# Patient Record
Sex: Male | Born: 1945 | Race: White | Hispanic: No | Smoking: Former smoker
Health system: Southern US, Community
[De-identification: ages and names within clinical notes are randomized; demographics above are authoritative.]

## PROBLEM LIST (undated history)

## (undated) DIAGNOSIS — E119 Type 2 diabetes mellitus without complications: Secondary | ICD-10-CM

## (undated) DIAGNOSIS — C679 Malignant neoplasm of bladder, unspecified: Secondary | ICD-10-CM

## (undated) DIAGNOSIS — I10 Essential (primary) hypertension: Secondary | ICD-10-CM

## (undated) DIAGNOSIS — I4510 Unspecified right bundle-branch block: Secondary | ICD-10-CM

## (undated) DIAGNOSIS — I440 Atrioventricular block, first degree: Secondary | ICD-10-CM

## (undated) DIAGNOSIS — K219 Gastro-esophageal reflux disease without esophagitis: Secondary | ICD-10-CM

## (undated) DIAGNOSIS — I444 Left anterior fascicular block: Secondary | ICD-10-CM

## (undated) DIAGNOSIS — I2510 Atherosclerotic heart disease of native coronary artery without angina pectoris: Secondary | ICD-10-CM

## (undated) DIAGNOSIS — D494 Neoplasm of unspecified behavior of bladder: Secondary | ICD-10-CM

## (undated) DIAGNOSIS — I452 Bifascicular block: Secondary | ICD-10-CM

## (undated) DIAGNOSIS — I219 Acute myocardial infarction, unspecified: Secondary | ICD-10-CM

## (undated) DIAGNOSIS — S22009A Unspecified fracture of unspecified thoracic vertebra, initial encounter for closed fracture: Secondary | ICD-10-CM

## (undated) DIAGNOSIS — R55 Syncope and collapse: Secondary | ICD-10-CM

## (undated) HISTORY — PX: COLONOSCOPY: SHX174

## (undated) HISTORY — PX: OTHER SURGICAL HISTORY: SHX169

## (undated) HISTORY — PX: HERNIA REPAIR: SHX51

---

## 2012-12-27 HISTORY — PX: CATARACT EXTRACTION, BILATERAL: SHX1313

## 2015-06-30 ENCOUNTER — Emergency Department (HOSPITAL_BASED_OUTPATIENT_CLINIC_OR_DEPARTMENT_OTHER)
Admission: EM | Admit: 2015-06-30 | Discharge: 2015-06-30 | Disposition: A | Discharge status: Home / Self Care | Payer: Medicare Other | Attending: Emergency Medicine | Admitting: Emergency Medicine

## 2015-06-30 ENCOUNTER — Emergency Department (HOSPITAL_BASED_OUTPATIENT_CLINIC_OR_DEPARTMENT_OTHER): Payer: Medicare Other

## 2015-06-30 ENCOUNTER — Encounter (HOSPITAL_BASED_OUTPATIENT_CLINIC_OR_DEPARTMENT_OTHER): Payer: Self-pay | Admitting: *Deleted

## 2015-06-30 DIAGNOSIS — M791 Myalgia: Secondary | ICD-10-CM | POA: Insufficient documentation

## 2015-06-30 DIAGNOSIS — Z87891 Personal history of nicotine dependence: Secondary | ICD-10-CM | POA: Diagnosis not present

## 2015-06-30 DIAGNOSIS — J209 Acute bronchitis, unspecified: Secondary | ICD-10-CM | POA: Insufficient documentation

## 2015-06-30 DIAGNOSIS — Z79899 Other long term (current) drug therapy: Secondary | ICD-10-CM | POA: Insufficient documentation

## 2015-06-30 DIAGNOSIS — Z7901 Long term (current) use of anticoagulants: Secondary | ICD-10-CM | POA: Insufficient documentation

## 2015-06-30 DIAGNOSIS — I2510 Atherosclerotic heart disease of native coronary artery without angina pectoris: Secondary | ICD-10-CM | POA: Insufficient documentation

## 2015-06-30 DIAGNOSIS — R531 Weakness: Secondary | ICD-10-CM | POA: Diagnosis not present

## 2015-06-30 DIAGNOSIS — E119 Type 2 diabetes mellitus without complications: Secondary | ICD-10-CM | POA: Insufficient documentation

## 2015-06-30 DIAGNOSIS — I10 Essential (primary) hypertension: Secondary | ICD-10-CM | POA: Diagnosis not present

## 2015-06-30 DIAGNOSIS — Z7982 Long term (current) use of aspirin: Secondary | ICD-10-CM | POA: Diagnosis not present

## 2015-06-30 DIAGNOSIS — R05 Cough: Secondary | ICD-10-CM | POA: Diagnosis present

## 2015-06-30 DIAGNOSIS — J40 Bronchitis, not specified as acute or chronic: Secondary | ICD-10-CM

## 2015-06-30 HISTORY — DX: Atherosclerotic heart disease of native coronary artery without angina pectoris: I25.10

## 2015-06-30 HISTORY — DX: Type 2 diabetes mellitus without complications: E11.9

## 2015-06-30 HISTORY — DX: Essential (primary) hypertension: I10

## 2015-06-30 LAB — COMPREHENSIVE METABOLIC PANEL
ALBUMIN: 4.2 g/dL (ref 3.5–5.0)
ALT: 22 U/L (ref 17–63)
AST: 26 U/L (ref 15–41)
Alkaline Phosphatase: 51 U/L (ref 38–126)
Anion gap: 8 (ref 5–15)
BILIRUBIN TOTAL: 1 mg/dL (ref 0.3–1.2)
BUN: 17 mg/dL (ref 6–20)
CO2: 28 mmol/L (ref 22–32)
Calcium: 9.4 mg/dL (ref 8.9–10.3)
Chloride: 95 mmol/L — ABNORMAL LOW (ref 101–111)
Creatinine, Ser: 1.2 mg/dL (ref 0.61–1.24)
GFR, EST NON AFRICAN AMERICAN: 60 mL/min — AB (ref >60)
GLUCOSE: 199 mg/dL — AB (ref 65–99)
Potassium: 4.3 mmol/L (ref 3.5–5.1)
SODIUM: 131 mmol/L — AB (ref 135–145)
Total Protein: 7.7 g/dL (ref 6.5–8.1)

## 2015-06-30 LAB — CBC WITH DIFFERENTIAL/PLATELET
Basophils Absolute: 0 10*3/uL (ref 0.0–0.1)
Basophils Relative: 1 % (ref 0–1)
EOS PCT: 4 % (ref 0–5)
Eosinophils Absolute: 0.1 10*3/uL (ref 0.0–0.7)
HEMATOCRIT: 46 % (ref 39.0–52.0)
Hemoglobin: 16 g/dL (ref 13.0–17.0)
LYMPHS PCT: 25 % (ref 12–46)
Lymphs Abs: 1 10*3/uL (ref 0.7–4.0)
MCH: 30.9 pg (ref 26.0–34.0)
MCHC: 34.8 g/dL (ref 30.0–36.0)
MCV: 88.8 fL (ref 78.0–100.0)
MONO ABS: 0.8 10*3/uL (ref 0.1–1.0)
MONOS PCT: 21 % — AB (ref 3–12)
NEUTROS ABS: 2 10*3/uL (ref 1.7–7.7)
NEUTROS PCT: 49 % (ref 43–77)
Platelets: 213 10*3/uL (ref 150–400)
RBC: 5.18 MIL/uL (ref 4.22–5.81)
RDW: 12.7 % (ref 11.5–15.5)
WBC: 4 10*3/uL (ref 4.0–10.5)

## 2015-06-30 LAB — BRAIN NATRIURETIC PEPTIDE: B Natriuretic Peptide: 53.8 pg/mL (ref 0.0–100.0)

## 2015-06-30 LAB — TROPONIN I: Troponin I: <0.03 ng/mL (ref <0.031)

## 2015-06-30 MED ORDER — ALBUTEROL SULFATE HFA 108 (90 BASE) MCG/ACT IN AERS: 1.0000 | INHALATION_SPRAY | Freq: Four times a day (QID) | RESPIRATORY_TRACT | Status: DC | PRN

## 2015-06-30 MED ORDER — DOXYCYCLINE HYCLATE 100 MG PO CAPS: 100.0000 mg | ORAL_CAPSULE | Freq: Two times a day (BID) | ORAL | Status: DC

## 2015-06-30 MED ORDER — ALBUTEROL SULFATE HFA 108 (90 BASE) MCG/ACT IN AERS
2.0000 | INHALATION_SPRAY | Freq: Once | RESPIRATORY_TRACT | Status: AC
  Administered 2015-06-30: 2 via RESPIRATORY_TRACT
  Filled 2015-06-30: qty 6.7

## 2015-06-30 MED ORDER — DOXYCYCLINE HYCLATE 100 MG PO TABS
100.0000 mg | ORAL_TABLET | Freq: Once | ORAL | Status: AC
  Administered 2015-06-30: 100 mg via ORAL
  Filled 2015-06-30: qty 1

## 2015-06-30 NOTE — Discharge Instructions (Signed)
Acute Bronchitis Take the antibiotics as prescribed. Follow up with Dr. Felipa Eth. Return to the ED if you develop new or worsening symptoms. Bronchitis is inflammation of the airways that extend from the windpipe into the lungs (bronchi). The inflammation often causes mucus to develop. This leads to a cough, which is the most common symptom of bronchitis.  In acute bronchitis, the condition usually develops suddenly and goes away over time, usually in a couple weeks. Smoking, allergies, and asthma can make bronchitis worse. Repeated episodes of bronchitis may cause further lung problems.  CAUSES Acute bronchitis is most often caused by the same virus that causes a cold. The virus can spread from person to person (contagious) through coughing, sneezing, and touching contaminated objects. SIGNS AND SYMPTOMS   Cough.   Fever.   Coughing up mucus.   Body aches.   Chest congestion.   Chills.   Shortness of breath.   Sore throat.  DIAGNOSIS  Acute bronchitis is usually diagnosed through a physical exam. Your health care provider will also ask you questions about your medical history. Tests, such as chest X-rays, are sometimes done to rule out other conditions.  TREATMENT  Acute bronchitis usually goes away in a couple weeks. Oftentimes, no medical treatment is necessary. Medicines are sometimes given for relief of fever or cough. Antibiotic medicines are usually not needed but may be prescribed in certain situations. In some cases, an inhaler may be recommended to help reduce shortness of breath and control the cough. A cool mist vaporizer may also be used to help thin bronchial secretions and make it easier to clear the chest.  HOME CARE INSTRUCTIONS  Get plenty of rest.   Drink enough fluids to keep your urine clear or pale yellow (unless you have a medical condition that requires fluid restriction). Increasing fluids may help thin your respiratory secretions (sputum) and reduce  chest congestion, and it will prevent dehydration.   Take medicines only as directed by your health care provider.  If you were prescribed an antibiotic medicine, finish it all even if you start to feel better.  Avoid smoking and secondhand smoke. Exposure to cigarette smoke or irritating chemicals will make bronchitis worse. If you are a smoker, consider using nicotine gum or skin patches to help control withdrawal symptoms. Quitting smoking will help your lungs heal faster.   Reduce the chances of another bout of acute bronchitis by washing your hands frequently, avoiding people with cold symptoms, and trying not to touch your hands to your mouth, nose, or eyes.   Keep all follow-up visits as directed by your health care provider.  SEEK MEDICAL CARE IF: Your symptoms do not improve after 1 week of treatment.  SEEK IMMEDIATE MEDICAL CARE IF:  You develop an increased fever or chills.   You have chest pain.   You have severe shortness of breath.  You have bloody sputum.   You develop dehydration.  You faint or repeatedly feel like you are going to pass out.  You develop repeated vomiting.  You develop a severe headache. MAKE SURE YOU:   Understand these instructions.  Will watch your condition.  Will get help right away if you are not doing well or get worse. Document Released: 01/20/2005 Document Revised: 04/29/2014 Document Reviewed: 06/05/2013 [facility] Patient Information 2015 [place], [place]. This information is not intended to replace advice given to you by your health care provider. Make sure you discuss any questions you have with your health care provider.

## 2015-06-30 NOTE — ED Notes (Signed)
He has had a cough for a while per family. He also has had 2 ticks removed in the past month. Just finished Doxycycline.

## 2015-06-30 NOTE — ED Provider Notes (Signed)
CSN: [id number]     Arrival date & time 06/30/15  1610 History  This chart was scribed for Ezequiel Essex, MD by Randa Evens, ED Scribe. This patient was seen in room MH09/MH09 and the patient's care was started at 4:28 PM.   Chief Complaint  Patient presents with  . Cough    Patient is a 69 y.o. male presenting with cough. The history is provided by the patient. No language interpreter was used.  Cough Associated symptoms: fever, myalgias and sore throat   Associated symptoms: no chest pain, no headaches, no rash and no shortness of breath    HPI Comments: Gregory Cummings is a 69 y.o. male who presents to the Emergency Department complaining of worsening productive cough of clear sputum onset 2 weeks prior. Pt states that he has associated fever (max temp 100), myalgias, weakness and sore throat brought on from coughing. Pt does report tick bite 3-4 weeks prior. Pt states that he was prescribed doxycyline that he finished about 2 weeks prior. Pt states that a few days after he finished the doxycycline he found another tick bite about 2 days later. Denies CP, SOB, dizziness, light headedness, rash or HA.   Past Medical History  Diagnosis Date  . Hypertension   . Coronary artery disease   . Diabetes mellitus without complication    Past Surgical History  Procedure Laterality Date  . Hernia repair    . Cardiac stents     No family history on file. History  Substance Use Topics  . Smoking status: Former Research scientist (life sciences)  . Smokeless tobacco: Not on file  . Alcohol Use: No    Review of Systems  Constitutional: Positive for fever.  HENT: Positive for sore throat.   Respiratory: Positive for cough. Negative for shortness of breath.   Cardiovascular: Negative for chest pain.  Musculoskeletal: Positive for myalgias.  Skin: Negative for rash.  Neurological: Positive for weakness. Negative for headaches.  All other systems reviewed and are negative.     Allergies  Review of patient's  allergies indicates no known allergies.  Home Medications   Prior to Admission medications   Medication Sig Start Date End Date Taking? Authorizing Provider  aspirin 81 MG tablet Take 81 mg by mouth daily.   Yes Historical Provider, MD  carvedilol (COREG) 3.125 MG tablet Take 3.125 mg by mouth 2 (two) times daily with a meal.   Yes Historical Provider, MD  clopidogrel (PLAVIX) 75 MG tablet Take 75 mg by mouth daily.   Yes Historical Provider, MD  fenofibrate (TRICOR) 145 MG tablet Take 145 mg by mouth daily.   Yes Historical Provider, MD  LISINOPRIL PO Take by mouth.   Yes Historical Provider, MD  SIMVASTATIN PO Take by mouth.   Yes Historical Provider, MD  albuterol (PROVENTIL HFA;VENTOLIN HFA) 108 (90 BASE) MCG/ACT inhaler Inhale 1-2 puffs into the lungs every 6 (six) hours as needed for wheezing or shortness of breath. 06/30/15   Ezequiel Essex, MD  doxycycline (VIBRAMYCIN) 100 MG capsule Take 1 capsule (100 mg total) by mouth 2 (two) times daily. 06/30/15   Ezequiel Essex, MD   BP 129/88 mmHg  Pulse 73  Temp(Src) 99.2 F (37.3 C) (Oral)  Resp 20  Ht 5\' 7"  (1.702 m)  Wt 198 lb (89.812 kg)  BMI 31.00 kg/m2  SpO2 97%   Physical Exam  Constitutional: He is oriented to person, place, and time. He appears well-developed and well-nourished. No distress.  HENT:  Head: Normocephalic and atraumatic.  Mouth/Throat: Oropharynx is clear and moist. No oropharyngeal exudate.  Eyes: Conjunctivae and EOM are normal. Pupils are equal, round, and reactive to light.  Neck: Normal range of motion. Neck supple.  No meningismus.  Cardiovascular: Normal rate, regular rhythm, normal heart sounds and intact distal pulses.   No murmur heard. Pulmonary/Chest: Effort normal and breath sounds normal. No respiratory distress. He has no wheezes. He has no rales. He exhibits no tenderness.  Abdominal: Soft. There is no tenderness. There is no rebound and no guarding.  Musculoskeletal: Normal range of motion.  He exhibits no edema or tenderness.  Neurological: He is alert and oriented to person, place, and time. No cranial nerve deficit. He exhibits normal muscle tone. Coordination normal.  No ataxia on finger to nose bilaterally. No pronator drift. 5/5 strength throughout. CN 2-12 intact. Negative Romberg. Equal grip strength. Sensation intact. Gait is normal.   Skin: Skin is warm. No rash noted.  Psychiatric: He has a normal mood and affect. His behavior is normal.  Nursing note and vitals reviewed.   ED Course  Procedures (including critical care time) DIAGNOSTIC STUDIES: Oxygen Saturation is 96% on RA, adequate by my interpretation.    COORDINATION OF CARE: 4:39 PM-Discussed treatment plan with pt at bedside and pt agreed to plan.     Labs Review Labs Reviewed  CBC WITH DIFFERENTIAL/PLATELET - Abnormal; Notable for the following:    Monocytes Relative 21 (*)    All other components within normal limits  COMPREHENSIVE METABOLIC PANEL - Abnormal; Notable for the following:    Sodium 131 (*)    Chloride 95 (*)    Glucose, Bld 199 (*)    GFR calc non Af Amer 60 (*)    All other components within normal limits  TROPONIN I  BRAIN NATRIURETIC PEPTIDE  B. BURGDORFI ANTIBODIES  ROCKY MTN SPOTTED FVR ABS PNL(IGG+IGM)    Imaging Review Dg Chest 2 View  06/30/2015   CLINICAL DATA:  Productive cough with fever. Weakness and sore throat. History of cardiac stents. Hypertension. Diabetes.  EXAM: CHEST  2 VIEW  COMPARISON:  None.  FINDINGS: Lateral view degraded by patient arm position. Moderate lower thoracic spondylosis. Midline trachea. Normal heart size and mediastinal contours. No pleural effusion or pneumothorax. Clear lungs. Loop recorder projecting over the left chest.  IMPRESSION: No active cardiopulmonary disease.   Electronically Signed   By: Abigail Miyamoto M.D.   On: 06/30/2015 17:10     EKG Interpretation   Date/Time:  Monday June 30 2015 16:43:03 EDT Ventricular Rate:  80 PR  Interval:  210 QRS Duration: 152 QT Interval:  408 QTC Calculation: 470 R Axis:   -39 Text Interpretation:  Sinus rhythm with 1st degree A-V block with  occasional Premature ventricular complexes and Premature atrial complexes  Possible Left atrial enlargement Left axis deviation Right bundle branch  block Septal infarct , age undetermined Abnormal ECG No previous ECGs  available Confirmed by Katalaya Beel  MD, Kalel Harty [phone]) on 06/30/2015 4:45:09 PM      MDM   Final diagnoses:  Bronchitis   2-3 weeks of cough productive of clear mucus. Associated body aches and subjective fever. Patient concerned due to recent tick bites. Last was on doxycycline 2 weeks ago.  EKG with right bundle-branch block, no comparison.  Well-appearing. No rash. Nonfocal neurological exam. Lungs clear to auscultation.  Labs included [facility] spotted fever and Lyme titers.  Chest x-rays negative. No wheezing on exam. No hypoxia.  Treat for bronchitis with  bronchial dilators and antibiotics. Low suspicion for [facility] spotted fever or Lyme but doxycycline should cover these as well.  He will be called if his titers return positive.  Home number [phone] Daughter Leane Call [phone]  No hypoxia in the ED. No CP or SOB.  Return precautions discussed.      I personally performed the services described in this documentation, which was scribed in my presence. The recorded information has been reviewed and is accurate.     Ezequiel Essex, MD 06/30/15 [phone]

## 2015-07-01 LAB — B. BURGDORFI ANTIBODIES: B burgdorferi Ab IgG+IgM: <0.91 {ISR} (ref 0.00–0.90)

## 2015-07-02 LAB — ROCKY MTN SPOTTED FVR ABS PNL(IGG+IGM)
RMSF IgG: NEGATIVE
RMSF IgM: 0.28 index (ref 0.00–0.89)

## 2015-08-28 ENCOUNTER — Ambulatory Visit (INDEPENDENT_AMBULATORY_CARE_PROVIDER_SITE_OTHER): Payer: Medicare Other | Admitting: Pulmonary Disease

## 2015-08-28 ENCOUNTER — Encounter: Payer: Self-pay | Admitting: Pulmonary Disease

## 2015-08-28 VITALS — BP 127/75 | HR 72 | Temp 98.1°F | Ht 69.0 in | Wt 211.0 lb

## 2015-08-28 DIAGNOSIS — R05 Cough: Secondary | ICD-10-CM | POA: Diagnosis not present

## 2015-08-28 DIAGNOSIS — R053 Chronic cough: Secondary | ICD-10-CM

## 2015-08-28 NOTE — Patient Instructions (Signed)
Your cough may be related to Lisinopril or irritant exposure STOP taking lisinopril for at least 6-8 weeks If improved, ask cardiologist to change to alternative medication If no improvement, trial of pepcid x 6 wks  Breathing test

## 2015-08-28 NOTE — Assessment & Plan Note (Addendum)
Your cough may be related to Lisinopril or irritant exposure STOP taking lisinopril for at least 6-8 weeks If improved, ask cardiologist to change to alternative medication If no improvement, trial of pepcid x 6 wks   He would like to trial Pepcid first and that is fine

## 2015-08-28 NOTE — Progress Notes (Signed)
   Subjective:    Patient ID: Gregory Cummings, male    DOB: 1946/02/08, 69 y.o.   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 He reports a cough productive of clear sputum, off insidious onset, that seems to have no diurnal or seasonal variation. He does report a deep productive cough that seems to subside spontaneously after he has expectorated some sputum. He had an ER visit on July 4-chest x-ray was clear, on my review this does show his loop recorder. He was prescribed pro-air-which he reports improves his cough by about 50%. He took doxycycline of which was prescribed by his dermatologist. He denies sinus drip. He does report frequent heartburn requiring over-the-counter medications at least once every 2 days. Environment-he lives in an old house built in 1985, has 4 dogs, denies any exposure to odors or perfumes or secondhand smoke. He has 1-2 drinks every week. Spirometry did not show any evidence of airway obstruction  He has cardiac stents, medication review shows 2.5 mg of lisinopril that he has been taking for about 7 years.  Past Medical History  Diagnosis Date  . Hypertension   . Coronary artery disease   . Diabetes mellitus without complication     Past Surgical History  Procedure Laterality Date  . Hernia repair    . Cardiac stents     No Known Allergies  Social History   Social History  . Marital Status: Significant Other    Spouse Name: N/A  . Number of Children: N/A  . Years of Education: N/A   Occupational History  . Not on file.   Social History Main Topics  . Smoking status: Former Research scientist (life sciences)  . Smokeless tobacco: Not on file  . Alcohol Use: No  . Drug Use: No  . Sexual Activity: Not on file   Other Topics Concern  . Not on file   Social History Narrative    No family history on file.   Review of Systems  Constitutional: Negative for fever, chills, activity change, appetite change  and unexpected weight change.  HENT: Negative for congestion, dental problem, postnasal drip, rhinorrhea, sneezing, sore throat, trouble swallowing and voice change.   Eyes: Negative for visual disturbance.  Respiratory: Negative for cough, choking and shortness of breath.   Cardiovascular: Negative for chest pain and leg swelling.  Gastrointestinal: Negative for nausea, vomiting and abdominal pain.  Genitourinary: Negative for difficulty urinating.  Musculoskeletal: Negative for arthralgias.  Skin: Negative for rash.  Psychiatric/Behavioral: Negative for behavioral problems and confusion.       Objective:   Physical Exam  Gen. Pleasant, well-nourished, in no distress, normal affect ENT - no lesions, no post nasal drip Neck: No JVD, no thyromegaly, no carotid bruits Lungs: no use of accessory muscles, no dullness to percussion, clear without rales or rhonchi  Cardiovascular: Rhythm regular, heart sounds  normal, no murmurs or gallops, no peripheral edema Abdomen: soft and non-tender, no hepatosplenomegaly, BS normal. Musculoskeletal: No deformities, no cyanosis or clubbing Neuro:  alert, non focal       Assessment & Plan:

## 2015-11-06 ENCOUNTER — Telehealth: Payer: Self-pay | Admitting: Pulmonary Disease

## 2015-11-06 NOTE — Telephone Encounter (Signed)
LVM for pt to return call

## 2015-11-11 NOTE — Telephone Encounter (Signed)
lmtcb for pt.  

## 2017-09-26 DIAGNOSIS — R55 Syncope and collapse: Secondary | ICD-10-CM

## 2017-09-26 HISTORY — DX: Syncope and collapse: R55

## 2018-06-14 ENCOUNTER — Ambulatory Visit: Payer: Medicare Other | Admitting: Allergy and Immunology

## 2018-06-14 ENCOUNTER — Encounter: Payer: Self-pay | Admitting: Allergy and Immunology

## 2018-06-14 VITALS — BP 138/80 | HR 96 | Temp 98.3°F | Resp 20 | Ht 68.35 in | Wt 224.0 lb

## 2018-06-14 DIAGNOSIS — R05 Cough: Secondary | ICD-10-CM | POA: Diagnosis not present

## 2018-06-14 DIAGNOSIS — J310 Chronic rhinitis: Secondary | ICD-10-CM | POA: Diagnosis not present

## 2018-06-14 DIAGNOSIS — R053 Chronic cough: Secondary | ICD-10-CM

## 2018-06-14 MED ORDER — AZELASTINE & FLUTICASONE 137 & 50 MCG/ACT NA THPK: 1.0000 | PACK | Freq: Two times a day (BID) | NASAL | 5 refills | Status: DC | PRN

## 2018-06-14 NOTE — Assessment & Plan Note (Addendum)
All seasonal and perennial aeroallergen skin tests are negative despite a positive histamine control.  Intranasal steroids, intranasal antihistamines, and first generation antihistamines are effective for symptoms associated with non-allergic rhinitis, whereas second generation antihistamines such as cetirizine (Zyrtec), loratadine (Claritin) and fexofenadine (Allegra) have been found to be ineffective for this condition.  A prescription has been provided for azelastine/fluticasone nasal spray, one spray per nostril 1-2 times daily as needed. Proper nasal spray technique has been discussed and demonstrated.  Nasal saline lavage (NeilMed) has been recommended as needed and prior to medicated nasal sprays along with instructions for proper administration.  For thick post nasal drainage, add guaifenesin 600-1200 mg (Mucinex)  twice daily as needed with adequate hydration as discussed. 

## 2018-06-14 NOTE — Assessment & Plan Note (Signed)
The patient's history and physical examination suggest upper airway cough syndrome.  Spirometry today reveals normal ventilatory function. We will aggressively treat postnasal drainage and evaluate results.  Treatment plan as outlined above.  If the coughing persists or progresses despite this plan, we will evaluate further. 

## 2018-06-14 NOTE — Patient Instructions (Addendum)
Rhinitis/post nasal drainage All seasonal and perennial aeroallergen skin tests are negative despite a positive histamine control.  Intranasal steroids, intranasal antihistamines, and first generation antihistamines are effective for symptoms associated with non-allergic rhinitis, whereas second generation antihistamines such as cetirizine (Zyrtec), loratadine (Claritin) and fexofenadine (Allegra) have been found to be ineffective for this condition.  A prescription has been provided for azelastine/fluticasone nasal spray, one spray per nostril 1-2 times daily as needed. Proper nasal spray technique has been discussed and demonstrated.  Nasal saline lavage (NeilMed) has been recommended as needed and prior to medicated nasal sprays along with instructions for proper administration.  For thick post nasal drainage, add guaifenesin [phone] mg (Mucinex)  twice daily as needed with adequate hydration as discussed.  Cough, persistent The patient's history and physical examination suggest upper airway cough syndrome.  Spirometry today reveals normal ventilatory function. We will aggressively treat postnasal drainage and evaluate results.  Treatment plan as outlined above.  If the coughing persists or progresses despite this plan, we will evaluate further.   Return in about 4 months (around 10/14/2018), or if symptoms worsen or fail to improve.

## 2018-06-14 NOTE — Progress Notes (Signed)
New Patient Note  RE: Gregory Cummings MRN: [id number] DOB: September 25, 1946 Date of Office Visit: 06/14/2018  Referring provider: [name], MD Primary care provider: [name], MD  Chief Complaint: Sinus Problem and Cough   History of present illness: Gregory Cummings" Elison is a 72 y.o. male seen today in consultation requested by [name], MD.  He reports that since late February 2019 he has experienced "excessive" thick postnasal drainage, throat irritation, throat clearing, and coughing.  The cough originates in the base of the throat as a tickle/irritation and he believes that the cough is secondary to the postnasal drainage.  He denies significant nasal congestion, rhinorrhea, sneezing, nasal pruritus, and ocular pruritus.  The postnasal drainage and the cough have improved somewhat over the past 10 days. [name] has occasional heartburn which is currently well controlled by famotidine.  Assessment and plan: Rhinitis/post nasal drainage All seasonal and perennial aeroallergen skin tests are negative despite a positive histamine control.  Intranasal steroids, intranasal antihistamines, and first generation antihistamines are effective for symptoms associated with non-allergic rhinitis, whereas second generation antihistamines such as cetirizine (Zyrtec), loratadine (Claritin) and fexofenadine (Allegra) have been found to be ineffective for this condition.  A prescription has been provided for azelastine/fluticasone nasal spray, one spray per nostril 1-2 times daily as needed. Proper nasal spray technique has been discussed and demonstrated.  Nasal saline lavage (NeilMed) has been recommended as needed and prior to medicated nasal sprays along with instructions for proper administration.  For thick post nasal drainage, add guaifenesin [phone] mg (Mucinex)  twice daily as needed with adequate hydration as discussed.  Cough, persistent The patient's history and physical examination  suggest upper airway cough syndrome.  Spirometry today reveals normal ventilatory function. We will aggressively treat postnasal drainage and evaluate results.  Treatment plan as outlined above.  If the coughing persists or progresses despite this plan, we will evaluate further.   Meds ordered this encounter  Medications  . Azelastine & Fluticasone 137 & 50 MCG/ACT THPK    Sig: Place 1 spray into the nose 2 (two) times daily as needed.    Dispense:  1 each    Refill:  5    Diagnostics: Spirometry:  Normal with an FEV1 of 98% predicted and an FEV1 ratio of 99%.  Please see scanned spirometry results for details. Epicutaneous testing: Negative despite a positive histamine control. Intradermal testing: Negative.   Physical examination: Blood pressure 138/80, pulse 96, temperature 98.3 F (36.8 C), temperature source Oral, resp. rate 20, height 5' 8.35" (1.736 m), weight 223 lb 15.8 oz (101.6 kg), SpO2 96 %.  General: Alert, interactive, in no acute distress. HEENT: TMs pearly gray, turbinates moderately edematous without discharge, post-pharynx moderately erythematous. Neck: Supple without lymphadenopathy. Lungs: Clear to auscultation without wheezing, rhonchi or rales. CV: Normal S1, S2 without murmurs. Abdomen: Nondistended, nontender. Skin: Warm and dry, without lesions or rashes. Extremities:  No clubbing, cyanosis or edema. Neuro:   Grossly intact.  Review of systems:  Review of systems negative except as noted in HPI / PMHx or noted below: Review of Systems  Constitutional: Negative.   HENT: Negative.   Eyes: Negative.   Respiratory: Negative.   Cardiovascular: Negative.   Gastrointestinal: Negative.   Genitourinary: Negative.   Musculoskeletal: Negative.   Skin: Negative.   Neurological: Negative.   Endo/Heme/Allergies: Negative.   Psychiatric/Behavioral: Negative.     Past medical history:  Past Medical History:  Diagnosis Date  . Coronary artery disease     .  Diabetes mellitus without complication ([place])   . Hypertension     Past surgical history:  Past Surgical History:  Procedure Laterality Date  . cardiac stents    . HERNIA REPAIR      Family history: History reviewed. No pertinent family history.  Social history: Social History   Socioeconomic History  . Marital status: Significant Other    Spouse name: Not on file  . Number of children: Not on file  . Years of education: Not on file  . Highest education level: Not on file  Occupational History  . Not on file  Social Needs  . Financial resource strain: Not on file  . Food insecurity:    Worry: Not on file    Inability: Not on file  . Transportation needs:    Medical: Not on file    Non-medical: Not on file  Tobacco Use  . Smoking status: Former Smoker    Types: Pipe  . Smokeless tobacco: Never Used  Substance and Sexual Activity  . Alcohol use: No  . Drug use: No  . Sexual activity: Not on file  Lifestyle  . Physical activity:    Days per week: Not on file    Minutes per session: Not on file  . Stress: Not on file  Relationships  . Social connections:    Talks on phone: Not on file    Gets together: Not on file    Attends religious service: Not on file    Active member of club or organization: Not on file    Attends meetings of clubs or organizations: Not on file    Relationship status: Not on file  . Intimate partner violence:    Fear of current or ex partner: Not on file    Emotionally abused: Not on file    Physically abused: Not on file    Forced sexual activity: Not on file  Other Topics Concern  . Not on file  Social History Narrative  . Not on file   Environmental History: The patient lives in a 74 year old house with hardwood floors throughout, heat times, and central air.  There 3 dogs in the home which have access to his bedroom.  There is no known mold/water damage in the home.  He is a non-smoker.  Allergies as of 06/14/2018   No Known  Allergies     Medication List        Accurate as of 06/14/18  5:45 PM. Always use your most recent med list.          albuterol 108 (90 Base) MCG/ACT inhaler Commonly known as:  PROVENTIL HFA;VENTOLIN HFA Inhale 1-2 puffs into the lungs every 6 (six) hours as needed for wheezing or shortness of breath.   aspirin 81 MG tablet Take 81 mg by mouth daily.   Azelastine & Fluticasone 137 & 50 MCG/ACT Thpk Place 1 spray into the nose 2 (two) times daily as needed.   clopidogrel 75 MG tablet Commonly known as:  PLAVIX Take 75 mg by mouth daily.   fenofibrate 160 MG tablet Take 160 mg by mouth daily.   SIMVASTATIN PO Take 20 mg by mouth daily.       Known medication allergies: No Known Allergies  I appreciate the opportunity to take part in Brogen's care. Please do not hesitate to contact me with questions.  Sincerely,   R. Edgar Frisk, MD

## 2018-11-01 ENCOUNTER — Ambulatory Visit: Payer: Medicare Other | Admitting: Allergy and Immunology

## 2018-11-21 DIAGNOSIS — I444 Left anterior fascicular block: Secondary | ICD-10-CM

## 2018-11-21 DIAGNOSIS — I452 Bifascicular block: Secondary | ICD-10-CM

## 2018-11-21 DIAGNOSIS — I440 Atrioventricular block, first degree: Secondary | ICD-10-CM

## 2018-11-21 DIAGNOSIS — I4510 Unspecified right bundle-branch block: Secondary | ICD-10-CM

## 2018-11-21 HISTORY — DX: Unspecified right bundle-branch block: I45.10

## 2018-11-21 HISTORY — DX: Atrioventricular block, first degree: I44.0

## 2018-11-21 HISTORY — DX: Bifascicular block: I45.2

## 2018-11-21 HISTORY — DX: Left anterior fascicular block: I44.4

## 2019-03-28 ENCOUNTER — Other Ambulatory Visit: Payer: Self-pay | Admitting: Urology

## 2019-03-29 ENCOUNTER — Encounter (HOSPITAL_COMMUNITY): Payer: Self-pay

## 2019-03-29 NOTE — Patient Instructions (Addendum)
Your procedure is scheduled on: Thursday, April 12, 2019   Surgery Time:  7:30AM-8:30AM   Report to [facility] Main  Entrance   Report to Short Stay at 5:30 AM   Call this number if you have problems the morning of surgery [phone]   Do not eat food or drink liquids :After Midnight.   Brush your teeth the morning of surgery.   Do NOT smoke after Midnight   Take these medicines the morning of surgery with A SIP OF WATER: None  DO NOT TAKE ANY DIABETIC MEDICATIONS DAY OF YOUR SURGERY                               You may not have any metal on your body including jewelry, and body piercings             Do not wear lotions, powders, perfumes/cologne, or deodorant                           Men may shave face and neck.   Do not bring valuables to the hospital. [place].   Contacts, dentures or bridgework may not be worn into surgery.    Patients discharged the day of surgery will not be allowed to drive home.   Special Instructions: Bring a copy of your healthcare power of attorney and living will documents         the day of surgery if you haven't scanned them in before.              Please read over the following fact sheets you were given:  [facility] - Preparing for Surgery Before surgery, you can play an important role.  Because skin is not sterile, your skin needs to be as free of germs as possible.  You can reduce the number of germs on your skin by washing with CHG (chlorahexidine gluconate) soap before surgery.  CHG is an antiseptic cleaner which kills germs and bonds with the skin to continue killing germs even after washing. Please DO NOT use if you have an allergy to CHG or antibacterial soaps.  If your skin becomes reddened/irritated stop using the CHG and inform your nurse when you arrive at Short Stay. Do not shave (including legs and underarms) for at least 48 hours prior to the first CHG shower.   You may shave your face/neck.  Please follow these instructions carefully:  1.  Shower with CHG Soap the night before surgery and the  morning of surgery.  2.  If you choose to wash your hair, wash your hair first as usual with your normal  shampoo.  3.  After you shampoo, rinse your hair and body thoroughly to remove the shampoo.                             4.  Use CHG as you would any other liquid soap.  You can apply chg directly to the skin and wash.  Gently with a scrungie or clean washcloth.  5.  Apply the CHG Soap to your body ONLY FROM THE NECK DOWN.   Do   not use on face/ open  Wound or open sores. Avoid contact with eyes, ears mouth and   genitals (private parts).                       Wash face,  Genitals (private parts) with your normal soap.             6.  Wash thoroughly, paying special attention to the area where your    surgery  will be performed.  7.  Thoroughly rinse your body with warm water from the neck down.  8.  DO NOT shower/wash with your normal soap after using and rinsing off the CHG Soap.                9.  Pat yourself dry with a clean towel.            10.  Wear clean pajamas.            11.  Place clean sheets on your bed the night of your first shower and do not  sleep with pets. Day of Surgery : Do not apply any lotions/deodorants the morning of surgery.  Please wear clean clothes to the hospital/surgery center.  FAILURE TO FOLLOW THESE INSTRUCTIONS MAY RESULT IN THE CANCELLATION OF YOUR SURGERY  PATIENT SIGNATURE_________________________________  NURSE SIGNATURE__________________________________  ________________________________________________________________________

## 2019-03-30 ENCOUNTER — Encounter (HOSPITAL_COMMUNITY): Payer: Self-pay

## 2019-03-30 ENCOUNTER — Encounter (HOSPITAL_COMMUNITY)
Admission: RE | Admit: 2019-03-30 | Discharge: 2019-03-30 | Disposition: A | Discharge status: Home / Self Care | Payer: Medicare Other | Source: Ambulatory Visit | Attending: Urology | Admitting: Urology

## 2019-03-30 ENCOUNTER — Other Ambulatory Visit: Payer: Self-pay

## 2019-03-30 DIAGNOSIS — Z01818 Encounter for other preprocedural examination: Secondary | ICD-10-CM | POA: Diagnosis not present

## 2019-03-30 HISTORY — DX: Atrioventricular block, first degree: I44.0

## 2019-03-30 HISTORY — DX: Acute myocardial infarction, unspecified: I21.9

## 2019-03-30 HISTORY — DX: Bifascicular block: I45.2

## 2019-03-30 HISTORY — DX: Syncope and collapse: R55

## 2019-03-30 HISTORY — DX: Neoplasm of unspecified behavior of bladder: D49.4

## 2019-03-30 HISTORY — DX: Gastro-esophageal reflux disease without esophagitis: K21.9

## 2019-03-30 HISTORY — DX: Unspecified right bundle-branch block: I45.10

## 2019-03-30 HISTORY — DX: Left anterior fascicular block: I44.4

## 2019-03-30 HISTORY — DX: Malignant neoplasm of bladder, unspecified: C67.9

## 2019-03-30 HISTORY — DX: Unspecified fracture of unspecified thoracic vertebra, initial encounter for closed fracture: S22.009A

## 2019-03-30 LAB — BASIC METABOLIC PANEL
Anion gap: 6 (ref 5–15)
BUN: 23 mg/dL (ref 8–23)
CO2: 26 mmol/L (ref 22–32)
Calcium: 9.2 mg/dL (ref 8.9–10.3)
Chloride: 102 mmol/L (ref 98–111)
Creatinine, Ser: 1.33 mg/dL — ABNORMAL HIGH (ref 0.61–1.24)
GFR calc Af Amer: >60 mL/min (ref >60)
GFR calc non Af Amer: 52 mL/min — ABNORMAL LOW (ref >60)
Glucose, Bld: 224 mg/dL — ABNORMAL HIGH (ref 70–99)
Potassium: 4.6 mmol/L (ref 3.5–5.1)
Sodium: 134 mmol/L — ABNORMAL LOW (ref 135–145)

## 2019-03-30 LAB — CBC
HCT: 44.3 % (ref 39.0–52.0)
Hemoglobin: 14.6 g/dL (ref 13.0–17.0)
MCH: 30.7 pg (ref 26.0–34.0)
MCHC: 33 g/dL (ref 30.0–36.0)
MCV: 93.3 fL (ref 80.0–100.0)
Platelets: 241 10*3/uL (ref 150–400)
RBC: 4.75 MIL/uL (ref 4.22–5.81)
RDW: 12.4 % (ref 11.5–15.5)
WBC: 5.6 10*3/uL (ref 4.0–10.5)
nRBC: 0 % (ref 0.0–0.2)

## 2019-03-30 LAB — HEMOGLOBIN A1C
Hgb A1c MFr Bld: 9.9 % — ABNORMAL HIGH (ref 4.8–5.6)
Mean Plasma Glucose: 237.43 mg/dL

## 2019-03-30 LAB — GLUCOSE, CAPILLARY: Glucose-Capillary: 245 mg/dL — ABNORMAL HIGH (ref 70–99)

## 2019-03-30 NOTE — Pre-Procedure Instructions (Signed)
BMP results 03/30/2019 sent to Dr. Louis Meckel via epic.  Left chart with Ivin Booty RN to follow-up on Hgb A1C results  03/30/2019.

## 2019-03-30 NOTE — Pre-Procedure Instructions (Signed)
EKG 11/21/2018 on chart

## 2019-04-02 NOTE — Progress Notes (Signed)
Hgb A1C was 9.9 on 4/3. thanks
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
## 2019-04-04 NOTE — Anesthesia Preprocedure Evaluation (Addendum)
Anesthesia Evaluation  Patient identified by MRN, date of birth, ID band Patient awake    Reviewed: Allergy & Precautions, NPO status , Patient's Chart, lab work & pertinent test results  History of Anesthesia Complications Negative for: history of anesthetic complications  Airway Mallampati: II  TM Distance: >3 FB Neck ROM: Full    Dental no notable dental hx. (+) Dental Advisory Given   Pulmonary neg pulmonary ROS, former smoker,    Pulmonary exam normal        Cardiovascular hypertension, Pt. on medications + CAD, + Past MI and + Cardiac Stents  Normal cardiovascular exam     Neuro/Psych negative neurological ROS     GI/Hepatic Neg liver ROS, GERD  ,  Endo/Other  diabetes  Renal/GU Renal InsufficiencyRenal disease     Musculoskeletal negative musculoskeletal ROS (+)   Abdominal   Peds  Hematology negative hematology ROS (+)   Anesthesia Other Findings Day of surgery medications reviewed with the patient.  Reproductive/Obstetrics                           Anesthesia Physical Anesthesia Plan  ASA: III  Anesthesia Plan: General   Post-op Pain Management:    Induction: Intravenous  PONV Risk Score and Plan: 3 and Ondansetron, Dexamethasone and Scopolamine patch - Pre-op  Airway Management Planned: Oral ETT  Additional Equipment:   Intra-op Plan:   Post-operative Plan: Extubation in OR  Informed Consent: I have reviewed the patients History and Physical, chart, labs and discussed the procedure including the risks, benefits and alternatives for the proposed anesthesia with the patient or authorized representative who has indicated his/her understanding and acceptance.     Dental advisory given  Plan Discussed with: Anesthesiologist, CRNA and Surgeon  Anesthesia Plan Comments: (See PAT note 03/30/19, Konrad Felix, PA-C)      Anesthesia Quick Evaluation

## 2019-04-12 ENCOUNTER — Encounter (HOSPITAL_COMMUNITY): Payer: Self-pay | Admitting: *Deleted

## 2019-04-12 ENCOUNTER — Ambulatory Visit (HOSPITAL_COMMUNITY): Payer: Medicare Other | Admitting: Anesthesiology

## 2019-04-12 ENCOUNTER — Ambulatory Visit (HOSPITAL_COMMUNITY): Payer: Medicare Other

## 2019-04-12 ENCOUNTER — Ambulatory Visit (HOSPITAL_COMMUNITY)
Admission: RE | Admit: 2019-04-12 | Discharge: 2019-04-12 | Disposition: A | Discharge status: Home / Self Care | Payer: Medicare Other | Attending: Urology | Admitting: Urology

## 2019-04-12 ENCOUNTER — Encounter (HOSPITAL_COMMUNITY)
Admission: RE | Disposition: A | Discharge status: Home / Self Care | Payer: Self-pay | Source: Home / Self Care | Attending: Urology

## 2019-04-12 ENCOUNTER — Ambulatory Visit (HOSPITAL_COMMUNITY): Payer: Medicare Other | Admitting: Physician Assistant

## 2019-04-12 DIAGNOSIS — C675 Malignant neoplasm of bladder neck: Secondary | ICD-10-CM | POA: Diagnosis not present

## 2019-04-12 DIAGNOSIS — E7800 Pure hypercholesterolemia, unspecified: Secondary | ICD-10-CM | POA: Insufficient documentation

## 2019-04-12 DIAGNOSIS — I2510 Atherosclerotic heart disease of native coronary artery without angina pectoris: Secondary | ICD-10-CM | POA: Diagnosis not present

## 2019-04-12 DIAGNOSIS — Z7982 Long term (current) use of aspirin: Secondary | ICD-10-CM | POA: Insufficient documentation

## 2019-04-12 DIAGNOSIS — E119 Type 2 diabetes mellitus without complications: Secondary | ICD-10-CM | POA: Diagnosis not present

## 2019-04-12 DIAGNOSIS — K219 Gastro-esophageal reflux disease without esophagitis: Secondary | ICD-10-CM | POA: Diagnosis not present

## 2019-04-12 DIAGNOSIS — C679 Malignant neoplasm of bladder, unspecified: Secondary | ICD-10-CM | POA: Diagnosis present

## 2019-04-12 DIAGNOSIS — C672 Malignant neoplasm of lateral wall of bladder: Secondary | ICD-10-CM | POA: Diagnosis not present

## 2019-04-12 DIAGNOSIS — D494 Neoplasm of unspecified behavior of bladder: Secondary | ICD-10-CM

## 2019-04-12 DIAGNOSIS — Z794 Long term (current) use of insulin: Secondary | ICD-10-CM | POA: Diagnosis not present

## 2019-04-12 DIAGNOSIS — I252 Old myocardial infarction: Secondary | ICD-10-CM | POA: Diagnosis not present

## 2019-04-12 DIAGNOSIS — Z79899 Other long term (current) drug therapy: Secondary | ICD-10-CM | POA: Insufficient documentation

## 2019-04-12 HISTORY — PX: TRANSURETHRAL RESECTION OF BLADDER TUMOR WITH MITOMYCIN-C: SHX6459

## 2019-04-12 LAB — GLUCOSE, CAPILLARY
Glucose-Capillary: 314 mg/dL — ABNORMAL HIGH (ref 70–99)
Glucose-Capillary: 256 mg/dL — ABNORMAL HIGH (ref 70–99)
Glucose-Capillary: 178 mg/dL — ABNORMAL HIGH (ref 70–99)

## 2019-04-12 SURGERY — TRANSURETHRAL RESECTION OF BLADDER TUMOR WITH MITOMYCIN-C
Anesthesia: General | Laterality: Bilateral

## 2019-04-12 MED ORDER — DEXAMETHASONE SODIUM PHOSPHATE 10 MG/ML IJ SOLN
INTRAMUSCULAR | Status: DC | PRN
  Administered 2019-04-12: 4 mg via INTRAVENOUS

## 2019-04-12 MED ORDER — TRAMADOL HCL 50 MG PO TABS: 50.0000 mg | ORAL_TABLET | Freq: Four times a day (QID) | ORAL | 0 refills | Status: DC | PRN

## 2019-04-12 MED ORDER — PROMETHAZINE HCL 25 MG/ML IJ SOLN: 6.2500 mg | INTRAMUSCULAR | Status: DC | PRN

## 2019-04-12 MED ORDER — SCOPOLAMINE 1 MG/3DAYS TD PT72
1.0000 | MEDICATED_PATCH | TRANSDERMAL | Status: DC
  Administered 2019-04-12: 06:00:00 1.5 mg via TRANSDERMAL
  Filled 2019-04-12: qty 1

## 2019-04-12 MED ORDER — PHENAZOPYRIDINE HCL 200 MG PO TABS: 200.0000 mg | ORAL_TABLET | Freq: Three times a day (TID) | ORAL | 0 refills | Status: DC | PRN

## 2019-04-12 MED ORDER — ONDANSETRON HCL 4 MG/2ML IJ SOLN
INTRAMUSCULAR | Status: DC | PRN
  Administered 2019-04-12: 4 mg via INTRAVENOUS

## 2019-04-12 MED ORDER — SUCCINYLCHOLINE CHLORIDE 200 MG/10ML IV SOSY
PREFILLED_SYRINGE | INTRAVENOUS | Status: DC | PRN
  Administered 2019-04-12: 140 mg via INTRAVENOUS

## 2019-04-12 MED ORDER — FENTANYL CITRATE (PF) 100 MCG/2ML IJ SOLN
INTRAMUSCULAR | Status: AC
  Filled 2019-04-12: qty 2

## 2019-04-12 MED ORDER — SUGAMMADEX SODIUM 200 MG/2ML IV SOLN
INTRAVENOUS | Status: AC
  Filled 2019-04-12: qty 6

## 2019-04-12 MED ORDER — PROPOFOL 10 MG/ML IV BOLUS
INTRAVENOUS | Status: DC | PRN
  Administered 2019-04-12: 170 mg via INTRAVENOUS

## 2019-04-12 MED ORDER — SUGAMMADEX SODIUM 200 MG/2ML IV SOLN
INTRAVENOUS | Status: DC | PRN
  Administered 2019-04-12: 200 mg via INTRAVENOUS

## 2019-04-12 MED ORDER — CEFAZOLIN SODIUM-DEXTROSE 2-4 GM/100ML-% IV SOLN
2.0000 g | INTRAVENOUS | Status: AC
  Administered 2019-04-12: 2 g via INTRAVENOUS
  Filled 2019-04-12: qty 100

## 2019-04-12 MED ORDER — STERILE WATER FOR IRRIGATION IR SOLN
Status: DC | PRN
  Administered 2019-04-12: 1000 mL

## 2019-04-12 MED ORDER — LIDOCAINE HCL URETHRAL/MUCOSAL 2 % EX GEL
CUTANEOUS | Status: AC
  Filled 2019-04-12: qty 5

## 2019-04-12 MED ORDER — ACETAMINOPHEN 500 MG PO TABS
1000.0000 mg | ORAL_TABLET | Freq: Once | ORAL | Status: AC
  Administered 2019-04-12: 1000 mg via ORAL
  Filled 2019-04-12: qty 2

## 2019-04-12 MED ORDER — LACTATED RINGERS IV SOLN
INTRAVENOUS | Status: DC | PRN
  Administered 2019-04-12: 06:00:00 via INTRAVENOUS
  Administered 2019-04-12: 1000 mL via INTRAVENOUS

## 2019-04-12 MED ORDER — BELLADONNA ALKALOIDS-OPIUM 16.2-60 MG RE SUPP
RECTAL | Status: DC | PRN
  Administered 2019-04-12: 1 via RECTAL

## 2019-04-12 MED ORDER — SODIUM CHLORIDE 0.9 % IR SOLN
Status: DC | PRN
  Administered 2019-04-12 (×2): 6000 mL

## 2019-04-12 MED ORDER — BELLADONNA ALKALOIDS-OPIUM 16.2-30 MG RE SUPP
RECTAL | Status: AC
  Filled 2019-04-12: qty 1

## 2019-04-12 MED ORDER — LIDOCAINE 2% (20 MG/ML) 5 ML SYRINGE
INTRAMUSCULAR | Status: DC | PRN
  Administered 2019-04-12: 60 mg via INTRAVENOUS
  Administered 2019-04-12: 40 mg via INTRAVENOUS

## 2019-04-12 MED ORDER — LIDOCAINE HCL URETHRAL/MUCOSAL 2 % EX GEL
CUTANEOUS | Status: DC | PRN
  Administered 2019-04-12: 1 via URETHRAL

## 2019-04-12 MED ORDER — PROPOFOL 10 MG/ML IV BOLUS
INTRAVENOUS | Status: AC
  Filled 2019-04-12: qty 40

## 2019-04-12 MED ORDER — SODIUM CHLORIDE 0.9 % IV SOLN
INTRAVENOUS | Status: DC | PRN
  Administered 2019-04-12: 20 mL

## 2019-04-12 MED ORDER — FENTANYL CITRATE (PF) 100 MCG/2ML IJ SOLN
INTRAMUSCULAR | Status: DC | PRN
  Administered 2019-04-12 (×2): 50 ug via INTRAVENOUS

## 2019-04-12 MED ORDER — FENTANYL CITRATE (PF) 100 MCG/2ML IJ SOLN
25.0000 ug | INTRAMUSCULAR | Status: DC | PRN
  Administered 2019-04-12: 09:00:00 50 ug via INTRAVENOUS

## 2019-04-12 MED ORDER — INSULIN ASPART 100 UNIT/ML ~~LOC~~ SOLN
20.0000 [IU] | Freq: Once | SUBCUTANEOUS | Status: AC
  Administered 2019-04-12: 20 [IU] via SUBCUTANEOUS

## 2019-04-12 MED ORDER — ROCURONIUM BROMIDE 10 MG/ML (PF) SYRINGE
PREFILLED_SYRINGE | INTRAVENOUS | Status: DC | PRN
  Administered 2019-04-12: 10 mg via INTRAVENOUS
  Administered 2019-04-12: 40 mg via INTRAVENOUS

## 2019-04-12 MED ORDER — INSULIN ASPART 100 UNIT/ML ~~LOC~~ SOLN
20.0000 [IU] | Freq: Once | SUBCUTANEOUS | Status: AC
  Administered 2019-04-12: 07:00:00 20 [IU] via SUBCUTANEOUS
  Filled 2019-04-12: qty 1

## 2019-04-12 SURGICAL SUPPLY — 20 items
BAG URINE DRAINAGE (UROLOGICAL SUPPLIES) ×1 IMPLANT
BAG URO CATCHER STRL LF (MISCELLANEOUS) ×2 IMPLANT
CATH FOLEY 2WAY SLVR  5CC 20FR (CATHETERS) ×1
CATH FOLEY 2WAY SLVR 5CC 20FR (CATHETERS) IMPLANT
CATH URET 5FR 28IN OPEN ENDED (CATHETERS) ×1 IMPLANT
COVER WAND RF STERILE (DRAPES) IMPLANT
ELECT REM PT RETURN 15FT ADLT (MISCELLANEOUS) ×2 IMPLANT
GLOVE BIOGEL M STRL SZ7.5 (GLOVE) ×2 IMPLANT
GOWN STRL REUS W/TWL XL LVL3 (GOWN DISPOSABLE) ×2 IMPLANT
HOLDER FOLEY CATH W/STRAP (MISCELLANEOUS) ×1 IMPLANT
KIT TURNOVER KIT A (KITS) IMPLANT
LOOP CUT BIPOLAR 24F LRG (ELECTROSURGICAL) ×1 IMPLANT
MANIFOLD NEPTUNE II (INSTRUMENTS) ×2 IMPLANT
NDL SAFETY ECLIPSE 18X1.5 (NEEDLE) ×1 IMPLANT
NEEDLE HYPO 18GX1.5 SHARP (NEEDLE)
PACK CYSTO (CUSTOM PROCEDURE TRAY) ×2 IMPLANT
SYR 10ML LL (SYRINGE) ×1 IMPLANT
SYRINGE IRR TOOMEY STRL 70CC (SYRINGE) ×1 IMPLANT
TUBING CONNECTING 10 (TUBING) ×2 IMPLANT
TUBING UROLOGY SET (TUBING) ×3 IMPLANT

## 2019-04-12 NOTE — Discharge Instructions (Signed)

## 2019-04-12 NOTE — Interval H&P Note (Signed)
History and Physical Interval Note:  04/12/2019 7:34 AM  Gregory Cummings  has presented today for surgery, with the diagnosis of BLADDER TUMOR.  The various methods of treatment have been discussed with the patient and family. After consideration of risks, benefits and other options for treatment, the patient has consented to  Procedure(s): TRANSURETHRAL RESECTION OF BLADDER TUMOR BILATERAL RETROGRADE PYELOGRAMS WITH POST OP GEMCITABINE (Bilateral) as a surgical intervention.  The patient's history has been reviewed, patient examined, no change in status, stable for surgery.  I have reviewed the patient's chart and labs.  Questions were answered to the patient's satisfaction.     Ardis Hughs

## 2019-04-12 NOTE — Anesthesia Procedure Notes (Signed)
Procedure Name: Intubation Performed by: Lavina Hamman, CRNA Pre-anesthesia Checklist: Patient identified, Emergency Drugs available, Suction available, Patient being monitored and Timeout performed Patient Re-evaluated:Patient Re-evaluated prior to induction Oxygen Delivery Method: Circle system utilized Preoxygenation: Pre-oxygenation with 100% oxygen Induction Type: IV induction Ventilation: Mask ventilation without difficulty Laryngoscope Size: Mac and 4 Grade View: Grade II Tube type: Oral Tube size: 7.0 mm Number of attempts: 1 Airway Equipment and Method: Stylet Placement Confirmation: ETT inserted through vocal cords under direct vision,  positive ETCO2,  CO2 detector and breath sounds checked- equal and bilateral Secured at: 23 cm Tube secured with: Tape Dental Injury: Teeth and Oropharynx as per pre-operative assessment  Comments: ATOI.  RSI with cricoid due to risk of Covid 19, patient asymptomatic.

## 2019-04-12 NOTE — Transfer of Care (Signed)
Immediate Anesthesia Transfer of Care Note  Patient: Gregory Cummings  Procedure(s) Performed: Procedure(s): TRANSURETHRAL RESECTION OF BLADDER TUMOR BILATERAL RETROGRADE PYELOGRAMS WITH POST OP GEMCITABINE (Bilateral)  Patient Location: PACU  Anesthesia Type:General  Level of Consciousness:  sedated, patient cooperative and responds to stimulation  Airway & Oxygen Therapy:Patient Spontanous Breathing and Patient connected to face mask oxgen  Post-op Assessment:  Report given to PACU RN and Post -op Vital signs reviewed and stable  Post vital signs:  Reviewed and stable  Last Vitals:  Vitals:   04/12/19 0531 04/12/19 0850  BP: (!) 150/95 121/79  Pulse: 76 63  Resp: 18 (!) 23  Temp: 36.6 C 36.4 C  SpO2: 80% 012%    Complications: No apparent anesthesia complications

## 2019-04-12 NOTE — H&P (Signed)
Asymptomatic microscopic hematuria  HPI: Gregory Cummings is a 73 year-old male established patient who is here for further eval and management of microscopic hematuria.  He did see the blood in his urine. He first noticed the blood approximately 12/27/2018. He has seen blood clots.   He is not having pain. He does not have a burning sensation when he urinates. He is not currently having trouble urinating. The patient has developed frequency and urgency. He does not have a history of urinary infections. He does not have a history of prostatitis. He has not recently had unwanted weight loss.   They do not have a history of kidney stones. The patient has no family history of GU malignancy. The patient is a not smoker. He has not been exposed to occupational hazards that may increase their risks for developing cancer.   The patient presents today for cystoscopic evaluation. He underwent a CT scan prior to his appointment today. This demonstrated a bladder thickening on the left lateral wall. Since the patient was last seen he has not had any ongoing hematuria.     ALLERGIES: None   MEDICATIONS: Lisinopril 2.5 mg tablet  Simvastatin 80 mg tablet  Aspirin Ec 81 mg tablet, delayed release  Humalog 100 unit/ml vial  Lamisil 125 Mg  Pepcid Ac 10 mg tablet  Toujeo Solostar 300 unit/ml (1.5 ml) insulin pen  Tricor 134 Mg     Notes: Discontinued Plavix 03/18/2019- Cardiologist Dr. Marcell Anger of [place]   GU PSH: Locm 300-399Mg /Ml Iodine,1Ml - 03/23/2019      PSH Notes: Repair of broken finger, repair of fractured arm, bilateral inguinal hernia   NON-GU PSH: Cardiac Stent Placement, 2 (2006), 1 (2009) Cataract surgery, Bilateral, Right (2014); Left and lense implant (2015)    GU PMH: Microscopic hematuria - 03/19/2019 Unil Inguinal Hernia W/O obst or gang,non-recurrent      PMH Notes: Heart disease   NON-GU PMH: Coronary Artery Disease GERD Hypercholesterolemia Myocardial Infarction     FAMILY HISTORY: Alzheimer's Disease - Mother Coronary Artery Disease - Father Death - Father, Mother Diabetes - Father Heart Attack - Father Heart Disease - Father   SOCIAL HISTORY: Marital Status: Divorced Preferred Language: English; Ethnicity: Not Hispanic Or Latino; Race: White Current Smoking Status: Patient does not smoke anymore. Smoked for 12 years.   Tobacco Use Assessment Completed: Used Tobacco in last 30 days? Does drink.  Does not drink caffeine. Patient's occupation is/was semi retired.    REVIEW OF SYSTEMS:    GU Review Male:   Patient reports frequent urination and burning/ pain with urination. Patient denies hard to postpone urination, get up at night to urinate, leakage of urine, stream starts and stops, trouble starting your stream, have to strain to urinate , erection problems, and penile pain.  Gastrointestinal (Upper):   Patient denies nausea, vomiting, and indigestion/ heartburn.  Gastrointestinal (Lower):   Patient denies diarrhea and constipation.  Constitutional:   Patient denies fever, night sweats, weight loss, and fatigue.  Skin:   Patient denies skin rash/ lesion and itching.  Eyes:   Patient denies blurred vision and double vision.  Ears/ Nose/ Throat:   Patient denies sore throat and sinus problems.  Hematologic/Lymphatic:   Patient denies swollen glands and easy bruising.  Cardiovascular:   Patient denies leg swelling and chest pains.  Respiratory:   Patient denies cough and shortness of breath.  Endocrine:   Patient denies excessive thirst.  Musculoskeletal:   Patient denies back pain and joint  pain.  Neurological:   Patient denies headaches and dizziness.  Psychologic:   Patient denies depression and anxiety.   VITAL SIGNS:      03/27/2019 11:58 AM  Weight 217 lb / 98.43 kg  Height 69 in / 175.26 cm  BP 147/85 mmHg  Heart Rate 76 /min  Temperature 98.4 F / 36.8 C  BMI 32.0 kg/m   MULTI-SYSTEM PHYSICAL EXAMINATION:    Constitutional:  Well-nourished. No physical deformities. Normally developed. Good grooming.   Respiratory: Normal breath sounds. No labored breathing, no use of accessory muscles.   Cardiovascular: Regular rate and rhythm. No murmur, no gallop. Normal temperature, normal extremity pulses, no swelling, no varicosities.      PAST DATA REVIEWED:  Source Of History:  Patient  Records Review:   Previous Doctor Records, Previous Patient Records, POC Tool  X-Ray Review: C.T. Abdomen/Pelvis: Reviewed Films. Discussed With Patient.     03/19/19  PSA  Total PSA 1.21 ng/mL    PROCEDURES:         Flexible Cystoscopy - 52000  Risks, benefits, and some of the potential complications of the procedure were discussed at length with the patient including infection, bleeding, voiding discomfort, urinary retention, fever, chills, sepsis, and others. All questions were answered. Informed consent was obtained. Sterile technique and intraurethral analgesia were used.  Meatus:  Normal size. Normal location. Normal condition.  Urethra:  No strictures.  External Sphincter:  Normal.  Verumontanum:  Normal.  Prostate:  Non-obstructing. No hyperplasia.  Bladder Neck:  Non-obstructing.  Ureteral Orifices:  Normal location. Normal size. Normal shape. Effluxed clear urine.  Bladder:  There is a papillary lesion on the left lateral wall with some heaped up mucosa surrounding it. There are no additional lesions appreciated. The tumor itself measures approximately 15 mm in size.      The lower urinary tract was carefully examined. The procedure was well-tolerated and without complications. Antibiotic instructions were given. Instructions were given to call the office immediately for bloody urine, difficulty urinating, urinary retention, painful or frequent urination, fever, chills, nausea, vomiting or other illness. The patient stated that he understood these instructions and would comply with them.         Urinalysis w/Scope Dipstick  Dipstick Cont'd Micro  Color: Yellow Bilirubin: Neg mg/dL WBC/hpf: NS (Not Seen)  Appearance: Clear Ketones: Neg mg/dL RBC/hpf: >60/hpf  Specific Gravity: 1.015 Blood: 3+ ery/uL Bacteria: NS (Not Seen)  pH: 7.5 Protein: Neg mg/dL Cystals: NS (Not Seen)  Glucose: 2+ mg/dL Urobilinogen: 0.2 mg/dL Casts: NS (Not Seen)    Nitrites: Neg Trichomonas: Not Present    Leukocyte Esterase: Neg leu/uL Mucous: Not Present      Epithelial Cells: NS (Not Seen)      Yeast: NS (Not Seen)      Sperm: Not Present    ASSESSMENT:      ICD-10 Details  1 GU:   Microscopic hematuria - R31.21   2   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 There is some edema and thickening of the bladder wall surrounding it. I relayed this information to the patient and we spoke about the next step in management. We discussed transurethral resection of his bladder tumor with concurrent bilateral  retrograde pyelograms. I also discussed the possibility of postoperative gemcitabine. I went through the surgery in detail including the risks and the benefits. Will try to get this scheduled quickly.

## 2019-04-12 NOTE — Anesthesia Postprocedure Evaluation (Signed)
Anesthesia Post Note  Patient: Gregory Cummings  Procedure(s) Performed: TRANSURETHRAL RESECTION OF BLADDER TUMOR BILATERAL RETROGRADE PYELOGRAMS WITH POST OP GEMCITABINE (Bilateral )     Patient location during evaluation: PACU Anesthesia Type: General Level of consciousness: sedated Pain management: pain level controlled Vital Signs Assessment: post-procedure vital signs reviewed and stable Respiratory status: spontaneous breathing and respiratory function stable Cardiovascular status: stable Postop Assessment: no apparent nausea or vomiting Anesthetic complications: no    Last Vitals:  Vitals:   04/12/19 1030 04/12/19 1115  BP: 116/65 (!) 151/80  Pulse: 66 63  Resp: 17 19  Temp:    SpO2: 96% 99%    Last Pain:  Vitals:   04/12/19 1115  TempSrc:   PainSc: 0-No pain                 Codi Folkerts DANIEL

## 2019-04-12 NOTE — Op Note (Signed)
Preoperative diagnosis:  1. Bladder tumor left lateral wall  Postoperative diagnosis:  1. Same, 2.5 cm 2. Bladder tumor anterior bladder neck, 5 mm  Procedure: 1. Cystoscopy, bilateral retrograde pyelogram with interpretation 2. Transurethral resection of bladder tumor, 2.5 cm  Surgeon: Ardis Hughs, MD  Anesthesia: General  Complications: None  Intraoperative findings:  #1: The patient's retrograde pyelograms were performed using 10 cc of Omnipaque contrast per side.  The right side demonstrated a normal caliber ureter with no filling defects or abnormalities.  The left side demonstrated a bifid collecting system with the bifurcation in the mid ureter.  There were no filling defects or hydroureteronephrosis. #2: The patient had a papillary tumor emanating from the left lateral wall of the bladder.  This was on a fairly narrow stalk, but there was edema and induration and abnormal mucosa at the base for several centimeters.  There was also an area on the anterior bladder neck at the 1 to 2 o'clock position with a small frondular tissue.  EBL: Minimal  Specimens:  #1: Bladder tumor, left lateral wall #2: Left lateral wall bladder tumor base #3: Anterior bladder neck  Indication: Gregory Cummings is a 73 y.o. patient with history of gross hematuria who was found to have a bladder tumor as part of his evaluation.  After reviewing the management options for treatment, he elected to proceed with the above surgical procedure(s). We have discussed the potential benefits and risks of the procedure, side effects of the proposed treatment, the likelihood of the patient achieving the goals of the procedure, and any potential problems that might occur during the procedure or recuperation. Informed consent has been obtained.  Description of procedure:  The patient was taken to the operating room and general anesthesia was induced.  The patient was placed in the dorsal lithotomy position,  prepped and draped in the usual sterile fashion, and preoperative antibiotics were administered. A preoperative time-out was performed.   A 21 French 30 degrees cystoscope was gently passed through the patient's urethra and into the bladder under visual guidance.  Cystoscopy was then performed with the above findings.  Using a 5 [place] open-ended ureteral catheter I then injected the patient's right ureteral orifice with 10 cc of Omnipaque contrast performing a retrograde pyelogram with the above findings.  We then focused our attention on the patient's left ureteral orifice and performed a retrograde pyelogram in a similar fashion with the above findings.  I then exchanged the 21 French cystoscope sheath for the 26 French resectoscope sheath using the visual obturator.  Using the loop element with electrocautery I resected the patient's bladder tumor in a routine fashion.  Once the tumor had been completely resected I evacuated the specimen and then re-resected the bladder tumor base.  Hemostasis was then achieved.  I then evacuated these specimen pieces and then resected the patient's anterior bladder neck.  Once I was satisfied that hemostasis was achieved the bladder was emptied and the scope was removed.  Lidocaine jelly was instilled into the patient's bladder and a B&O suppository into the patient's rectum.  There was blood per the urethral meatus at this time and as such I opted to place a 20 [place] Foley catheter.  He was subsequently extubated and returned to the PACU in stable condition.  Ardis Hughs, M.D.

## 2019-04-13 ENCOUNTER — Encounter (HOSPITAL_COMMUNITY): Payer: Self-pay | Admitting: Urology

## 2019-05-08 ENCOUNTER — Encounter: Payer: Self-pay | Admitting: Oncology

## 2019-05-17 ENCOUNTER — Other Ambulatory Visit: Payer: Self-pay

## 2019-05-17 ENCOUNTER — Inpatient Hospital Stay: Payer: Medicare Other | Attending: Oncology | Admitting: Oncology

## 2019-05-17 VITALS — BP 137/70 | HR 78 | Temp 98.7°F | Resp 18 | Ht 69.0 in | Wt 219.2 lb

## 2019-05-17 DIAGNOSIS — Z7189 Other specified counseling: Secondary | ICD-10-CM

## 2019-05-17 DIAGNOSIS — C679 Malignant neoplasm of bladder, unspecified: Secondary | ICD-10-CM

## 2019-05-17 MED ORDER — PROCHLORPERAZINE MALEATE 10 MG PO TABS: 10.0000 mg | ORAL_TABLET | Freq: Four times a day (QID) | ORAL | 0 refills | Status: DC | PRN

## 2019-05-17 MED ORDER — LIDOCAINE-PRILOCAINE 2.5-2.5 % EX CREA: 1.0000 "application " | TOPICAL_CREAM | CUTANEOUS | 0 refills | Status: DC | PRN

## 2019-05-17 NOTE — Progress Notes (Signed)
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
## 2019-05-21 ENCOUNTER — Other Ambulatory Visit: Payer: Self-pay | Admitting: Radiology

## 2019-05-22 ENCOUNTER — Other Ambulatory Visit: Payer: Self-pay | Admitting: Student

## 2019-05-23 ENCOUNTER — Telehealth: Payer: Self-pay | Admitting: Nutrition

## 2019-05-23 ENCOUNTER — Telehealth: Payer: Self-pay

## 2019-05-23 ENCOUNTER — Ambulatory Visit (HOSPITAL_COMMUNITY)
Admission: RE | Admit: 2019-05-23 | Discharge: 2019-05-23 | Disposition: A | Discharge status: Home / Self Care | Payer: Medicare Other | Source: Ambulatory Visit | Attending: Oncology | Admitting: Oncology

## 2019-05-23 ENCOUNTER — Encounter: Payer: Self-pay | Admitting: Oncology

## 2019-05-23 ENCOUNTER — Other Ambulatory Visit: Payer: Self-pay | Admitting: Oncology

## 2019-05-23 ENCOUNTER — Other Ambulatory Visit: Payer: Self-pay

## 2019-05-23 ENCOUNTER — Encounter (HOSPITAL_COMMUNITY): Payer: Self-pay

## 2019-05-23 DIAGNOSIS — Z79899 Other long term (current) drug therapy: Secondary | ICD-10-CM | POA: Insufficient documentation

## 2019-05-23 DIAGNOSIS — Z87828 Personal history of other (healed) physical injury and trauma: Secondary | ICD-10-CM | POA: Insufficient documentation

## 2019-05-23 DIAGNOSIS — Z7982 Long term (current) use of aspirin: Secondary | ICD-10-CM | POA: Diagnosis not present

## 2019-05-23 DIAGNOSIS — Z87891 Personal history of nicotine dependence: Secondary | ICD-10-CM | POA: Insufficient documentation

## 2019-05-23 DIAGNOSIS — I10 Essential (primary) hypertension: Secondary | ICD-10-CM | POA: Diagnosis not present

## 2019-05-23 DIAGNOSIS — E119 Type 2 diabetes mellitus without complications: Secondary | ICD-10-CM | POA: Insufficient documentation

## 2019-05-23 DIAGNOSIS — K219 Gastro-esophageal reflux disease without esophagitis: Secondary | ICD-10-CM | POA: Insufficient documentation

## 2019-05-23 DIAGNOSIS — Z955 Presence of coronary angioplasty implant and graft: Secondary | ICD-10-CM | POA: Diagnosis not present

## 2019-05-23 DIAGNOSIS — C679 Malignant neoplasm of bladder, unspecified: Secondary | ICD-10-CM | POA: Diagnosis not present

## 2019-05-23 DIAGNOSIS — I2510 Atherosclerotic heart disease of native coronary artery without angina pectoris: Secondary | ICD-10-CM | POA: Diagnosis not present

## 2019-05-23 DIAGNOSIS — I440 Atrioventricular block, first degree: Secondary | ICD-10-CM | POA: Diagnosis not present

## 2019-05-23 DIAGNOSIS — Z794 Long term (current) use of insulin: Secondary | ICD-10-CM | POA: Diagnosis not present

## 2019-05-23 DIAGNOSIS — I252 Old myocardial infarction: Secondary | ICD-10-CM | POA: Insufficient documentation

## 2019-05-23 HISTORY — PX: IR IMAGING GUIDED PORT INSERTION: IMG5740

## 2019-05-23 LAB — CBC WITH DIFFERENTIAL/PLATELET
Abs Immature Granulocytes: 0.03 10*3/uL (ref 0.00–0.07)
Basophils Absolute: 0.1 10*3/uL (ref 0.0–0.1)
Basophils Relative: 1 %
Eosinophils Absolute: 0.2 10*3/uL (ref 0.0–0.5)
Eosinophils Relative: 3 %
HCT: 45 % (ref 39.0–52.0)
Hemoglobin: 15.2 g/dL (ref 13.0–17.0)
Immature Granulocytes: 1 %
Lymphocytes Relative: 26 %
Lymphs Abs: 1.5 10*3/uL (ref 0.7–4.0)
MCH: 31.6 pg (ref 26.0–34.0)
MCHC: 33.8 g/dL (ref 30.0–36.0)
MCV: 93.6 fL (ref 80.0–100.0)
Monocytes Absolute: 0.7 10*3/uL (ref 0.1–1.0)
Monocytes Relative: 12 %
Neutro Abs: 3.2 10*3/uL (ref 1.7–7.7)
Neutrophils Relative %: 57 %
Platelets: 231 10*3/uL (ref 150–400)
RBC: 4.81 MIL/uL (ref 4.22–5.81)
RDW: 12.7 % (ref 11.5–15.5)
WBC: 5.6 10*3/uL (ref 4.0–10.5)
nRBC: 0 % (ref 0.0–0.2)

## 2019-05-23 LAB — BASIC METABOLIC PANEL
Anion gap: 7 (ref 5–15)
BUN: 20 mg/dL (ref 8–23)
CO2: 24 mmol/L (ref 22–32)
Calcium: 9.3 mg/dL (ref 8.9–10.3)
Chloride: 103 mmol/L (ref 98–111)
Creatinine, Ser: 1.07 mg/dL (ref 0.61–1.24)
GFR calc Af Amer: >60 mL/min (ref >60)
GFR calc non Af Amer: >60 mL/min (ref >60)
Glucose, Bld: 226 mg/dL — ABNORMAL HIGH (ref 70–99)
Potassium: 4.1 mmol/L (ref 3.5–5.1)
Sodium: 134 mmol/L — ABNORMAL LOW (ref 135–145)

## 2019-05-23 LAB — PROTIME-INR
INR: 1 (ref 0.8–1.2)
Prothrombin Time: 12.7 seconds (ref 11.4–15.2)

## 2019-05-23 LAB — GLUCOSE, CAPILLARY: Glucose-Capillary: 227 mg/dL — ABNORMAL HIGH (ref 70–99)

## 2019-05-23 MED ORDER — CEFAZOLIN SODIUM-DEXTROSE 2-4 GM/100ML-% IV SOLN
2.0000 g | INTRAVENOUS | Status: AC
  Administered 2019-05-23: 12:00:00 2 g via INTRAVENOUS

## 2019-05-23 MED ORDER — MIDAZOLAM HCL 2 MG/2ML IJ SOLN
INTRAMUSCULAR | Status: AC | PRN
  Administered 2019-05-23 (×3): 1 mg via INTRAVENOUS

## 2019-05-23 MED ORDER — LIDOCAINE-EPINEPHRINE 1 %-1:100000 IJ SOLN
INTRAMUSCULAR | Status: AC | PRN
  Administered 2019-05-23 (×2): 10 mL

## 2019-05-23 MED ORDER — SODIUM CHLORIDE 0.9 % IV SOLN
INTRAVENOUS | Status: DC
  Administered 2019-05-23: 11:00:00 via INTRAVENOUS

## 2019-05-23 MED ORDER — FENTANYL CITRATE (PF) 100 MCG/2ML IJ SOLN
INTRAMUSCULAR | Status: AC | PRN
  Administered 2019-05-23 (×2): 50 ug via INTRAVENOUS

## 2019-05-23 MED ORDER — LIDOCAINE-EPINEPHRINE 1 %-1:100000 IJ SOLN
INTRAMUSCULAR | Status: AC
  Filled 2019-05-23: qty 1

## 2019-05-23 MED ORDER — CEFAZOLIN SODIUM-DEXTROSE 2-4 GM/100ML-% IV SOLN
INTRAVENOUS | Status: AC
  Administered 2019-05-23: 12:00:00 2 g via INTRAVENOUS
  Filled 2019-05-23: qty 100

## 2019-05-23 MED ORDER — FENTANYL CITRATE (PF) 100 MCG/2ML IJ SOLN
INTRAMUSCULAR | Status: AC
  Filled 2019-05-23: qty 2

## 2019-05-23 MED ORDER — MIDAZOLAM HCL 2 MG/2ML IJ SOLN
INTRAMUSCULAR | Status: AC
  Filled 2019-05-23: qty 4

## 2019-05-23 MED ORDER — HEPARIN SOD (PORK) LOCK FLUSH 100 UNIT/ML IV SOLN
INTRAVENOUS | Status: AC
  Filled 2019-05-23: qty 5
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
## 2019-05-23 NOTE — Procedures (Signed)
Pre Procedure Dx: Bladder Cancer Post Procedural Dx: Same  Successful placement of right IJ approach port-a-cath with tip at the superior caval atrial junction. The catheter is ready for immediate use.  Estimated Blood Loss: Minimal  Complications: None immediate.  Jay Tracie Dore, MD Pager #: [phone]   

## 2019-05-23 NOTE — Discharge Instructions (Signed)
Moderate Conscious Sedation, Adult, Care After °These instructions provide you with information about caring for yourself after your procedure. Your health care provider may also give you more specific instructions. Your treatment has been planned according to current medical practices, but problems sometimes occur. Call your health care provider if you have any problems or questions after your procedure. °What can I expect after the procedure? °After your procedure, it is common: °· To feel sleepy for several hours. °· To feel clumsy and have poor balance for several hours. °· To have poor judgment for several hours. °· To vomit if you eat too soon. °Follow these instructions at home: °For at least 24 hours after the procedure: ° °· Do not: °? Participate in activities where you could fall or become injured. °? Drive. °? Use heavy machinery. °? Drink alcohol. °? Take sleeping pills or medicines that cause drowsiness. °? Make important decisions or sign legal documents. °? Take care of children on your own. °· Rest. °Eating and drinking °· Follow the diet recommended by your health care provider. °· If you vomit: °? Drink water, juice, or soup when you can drink without vomiting. °? Make sure you have little or no nausea before eating solid foods. °General instructions °· Have a responsible adult stay with you until you are awake and alert. °· Take over-the-counter and prescription medicines only as told by your health care provider. °· If you smoke, do not smoke without supervision. °· Keep all follow-up visits as told by your health care provider. This is important. °Contact a health care provider if: °· You keep feeling nauseous or you keep vomiting. °· You feel light-headed. °· You develop a rash. °· You have a fever. °Get help right away if: °· You have trouble breathing. °This information is not intended to replace advice given to you by your health care provider. Make sure you discuss any questions you have  with your health care provider. °Document Released: 10/03/2013 Document Revised: 05/17/2016 Document Reviewed: 04/03/2016 °Elsevier Interactive Patient Education © 2019 [employer] ° ° °Implanted Port Insertion, Care After °This sheet gives you information about how to care for yourself after your procedure. Your health care provider may also give you more specific instructions. If you have problems or questions, contact your health care provider. °What can I expect after the procedure? °After the procedure, it is common to have: °· Discomfort at the port insertion site. °· Bruising on the skin over the port. This should improve over 3-4 days. °Follow these instructions at home: °Port care °· After your port is placed, you will get a manufacturer's information card. The card has information about your port. Keep this card with you at all times. °· Take care of the port as told by your health care provider. Ask your health care provider if you or a family member can get training for taking care of the port at home. A home health care nurse may also take care of the port. °· Make sure to remember what type of port you have. °Incision care ° °  ° °· Follow instructions from your health care provider about how to take care of your port insertion site. Make sure you: °? Wash your hands with soap and water before and after you change your bandage (dressing). If soap and water are not available, use hand sanitizer. °? Change your dressing as told by your health care provider.  You may remove your dressing tomorrow. °? Leave stitches (sutures), skin   glue, or adhesive strips in place. These skin closures may need to stay in place for 2 weeks or longer. If adhesive strip edges start to loosen and curl up, you may trim the loose edges. Do not remove adhesive strips completely unless your health care provider tells you to do that.  DO not use EMLA cream for 2 weeks after port placement as this cream will remove surgical glue  on your incision. °· Check your port insertion site every day for signs of infection. Check for: °? Redness, swelling, or pain. °? Fluid or blood. °? Warmth. °? Pus or a bad smell. °Activity °· Return to your normal activities as told by your health care provider. Ask your health care provider what activities are safe for you. °· Do not lift anything that is heavier than 10 lb (4.5 kg), or the limit that you are told, until your health care provider says that it is safe. °General instructions °· Take over-the-counter and prescription medicines only as told by your health care provider. °· Do not take baths, swim, or use a hot tub until your health care provider approves. Ask your health care provider if you may take showers. You may only be allowed to take sponge baths.  You may shower tomorrow. °· Do not drive for 24 hours if you were given a sedative during your procedure. °· Wear a medical alert bracelet in case of an emergency. This will tell any health care providers that you have a port. °· Keep all follow-up visits as told by your health care provider. This is important. °Contact a health care provider if: °· You cannot flush your port with saline as directed, or you cannot draw blood from the port. °· You have a fever or chills. °· You have redness, swelling, or pain around your port insertion site. °· You have fluid or blood coming from your port insertion site. °· Your port insertion site feels warm to the touch. °· You have pus or a bad smell coming from the port insertion site. °Get help right away if: °· You have chest pain or shortness of breath. °· You have bleeding from your port that you cannot control. °Summary °· Take care of the port as told by your health care provider. Keep the manufacturer's information card with you at all times. °· Change your dressing as told by your health care provider. °· Contact a health care provider if you have a fever or chills or if you have redness, swelling, or  pain around your port insertion site. °· Keep all follow-up visits as told by your health care provider. °This information is not intended to replace advice given to you by your health care provider. Make sure you discuss any questions you have with your health care provider. °Document Released: 10/03/2013 Document Revised: 07/11/2018 Document Reviewed: 07/11/2018 °Elsevier Interactive Patient Education © 2019 [employer] ° °

## 2019-05-23 NOTE — Telephone Encounter (Signed)
Contacted patient to verify telephone visit for pre reg °

## 2019-05-23 NOTE — Telephone Encounter (Signed)
This nurse was unable to reply to patient message through my Chart so a call was placed to Gregory Cummings and her question was answered.

## 2019-05-24 ENCOUNTER — Inpatient Hospital Stay: Payer: Medicare Other

## 2019-05-29 ENCOUNTER — Other Ambulatory Visit: Payer: Self-pay

## 2019-05-29 ENCOUNTER — Inpatient Hospital Stay: Payer: Medicare Other

## 2019-05-29 ENCOUNTER — Inpatient Hospital Stay: Payer: Medicare Other | Attending: Oncology

## 2019-05-29 VITALS — BP 120/70 | HR 67 | Temp 98.7°F | Resp 20 | Wt 220.5 lb

## 2019-05-29 DIAGNOSIS — C670 Malignant neoplasm of trigone of bladder: Secondary | ICD-10-CM

## 2019-05-29 DIAGNOSIS — N400 Enlarged prostate without lower urinary tract symptoms: Secondary | ICD-10-CM | POA: Diagnosis not present

## 2019-05-29 DIAGNOSIS — Z79899 Other long term (current) drug therapy: Secondary | ICD-10-CM | POA: Insufficient documentation

## 2019-05-29 DIAGNOSIS — R5383 Other fatigue: Secondary | ICD-10-CM | POA: Diagnosis not present

## 2019-05-29 DIAGNOSIS — I77811 Abdominal aortic ectasia: Secondary | ICD-10-CM | POA: Insufficient documentation

## 2019-05-29 DIAGNOSIS — Z95828 Presence of other vascular implants and grafts: Secondary | ICD-10-CM | POA: Insufficient documentation

## 2019-05-29 DIAGNOSIS — I700 Atherosclerosis of aorta: Secondary | ICD-10-CM | POA: Diagnosis not present

## 2019-05-29 DIAGNOSIS — Z5111 Encounter for antineoplastic chemotherapy: Secondary | ICD-10-CM | POA: Insufficient documentation

## 2019-05-29 DIAGNOSIS — C679 Malignant neoplasm of bladder, unspecified: Secondary | ICD-10-CM

## 2019-05-29 LAB — CBC WITH DIFFERENTIAL (CANCER CENTER ONLY)
Abs Immature Granulocytes: 0.01 10*3/uL (ref 0.00–0.07)
Basophils Absolute: 0.1 10*3/uL (ref 0.0–0.1)
Basophils Relative: 1 %
Eosinophils Absolute: 0.2 10*3/uL (ref 0.0–0.5)
Eosinophils Relative: 4 %
HCT: 42.1 % (ref 39.0–52.0)
Hemoglobin: 14.2 g/dL (ref 13.0–17.0)
Immature Granulocytes: 0 %
Lymphocytes Relative: 24 %
Lymphs Abs: 1 10*3/uL (ref 0.7–4.0)
MCH: 31.3 pg (ref 26.0–34.0)
MCHC: 33.7 g/dL (ref 30.0–36.0)
MCV: 92.9 fL (ref 80.0–100.0)
Monocytes Absolute: 0.4 10*3/uL (ref 0.1–1.0)
Monocytes Relative: 10 %
Neutro Abs: 2.6 10*3/uL (ref 1.7–7.7)
Neutrophils Relative %: 61 %
Platelet Count: 224 10*3/uL (ref 150–400)
RBC: 4.53 MIL/uL (ref 4.22–5.81)
RDW: 12.5 % (ref 11.5–15.5)
WBC Count: 4.3 10*3/uL (ref 4.0–10.5)
nRBC: 0 % (ref 0.0–0.2)

## 2019-05-29 LAB — CMP (CANCER CENTER ONLY)
ALT: 23 U/L (ref 0–44)
AST: 24 U/L (ref 15–41)
Albumin: 3.5 g/dL (ref 3.5–5.0)
Alkaline Phosphatase: 64 U/L (ref 38–126)
Anion gap: 8 (ref 5–15)
BUN: 23 mg/dL (ref 8–23)
CO2: 24 mmol/L (ref 22–32)
Calcium: 9.1 mg/dL (ref 8.9–10.3)
Chloride: 103 mmol/L (ref 98–111)
Creatinine: 1.36 mg/dL — ABNORMAL HIGH (ref 0.61–1.24)
GFR, Est AFR Am: 59 mL/min — ABNORMAL LOW (ref >60)
GFR, Estimated: 51 mL/min — ABNORMAL LOW (ref >60)
Glucose, Bld: 336 mg/dL — ABNORMAL HIGH (ref 70–99)
Potassium: 4.3 mmol/L (ref 3.5–5.1)
Sodium: 135 mmol/L (ref 135–145)
Total Bilirubin: 0.5 mg/dL (ref 0.3–1.2)
Total Protein: 6.9 g/dL (ref 6.5–8.1)

## 2019-05-29 MED ORDER — SODIUM CHLORIDE 0.9 % IV SOLN
1000.0000 mg/m2 | Freq: Once | INTRAVENOUS | Status: AC
  Administered 2019-05-29: 2204 mg via INTRAVENOUS
  Filled 2019-05-29: qty 57.97

## 2019-05-29 MED ORDER — HEPARIN SOD (PORK) LOCK FLUSH 100 UNIT/ML IV SOLN
500.0000 [IU] | Freq: Once | INTRAVENOUS | Status: AC | PRN
  Administered 2019-05-29: 500 [IU]
  Filled 2019-05-29: qty 5

## 2019-05-29 MED ORDER — SODIUM CHLORIDE 0.9 % IV SOLN
70.0000 mg/m2 | Freq: Once | INTRAVENOUS | Status: AC
  Administered 2019-05-29: 154 mg via INTRAVENOUS
  Filled 2019-05-29: qty 154

## 2019-05-29 MED ORDER — PALONOSETRON HCL INJECTION 0.25 MG/5ML
INTRAVENOUS | Status: AC
  Filled 2019-05-29: qty 5

## 2019-05-29 MED ORDER — POTASSIUM CHLORIDE 2 MEQ/ML IV SOLN
Freq: Once | INTRAVENOUS | Status: AC
  Administered 2019-05-29: 10:00:00 via INTRAVENOUS
  Filled 2019-05-29: qty 10

## 2019-05-29 MED ORDER — SODIUM CHLORIDE 0.9 % IV SOLN
Freq: Once | INTRAVENOUS | Status: AC
  Administered 2019-05-29: 12:00:00 via INTRAVENOUS
  Filled 2019-05-29: qty 5

## 2019-05-29 MED ORDER — SODIUM CHLORIDE 0.9 % IV SOLN
Freq: Once | INTRAVENOUS | Status: AC
  Administered 2019-05-29: 09:00:00 via INTRAVENOUS
  Filled 2019-05-29: qty 250

## 2019-05-29 MED ORDER — SODIUM CHLORIDE 0.9% FLUSH
10.0000 mL | INTRAVENOUS | Status: DC | PRN
  Administered 2019-05-29: 10 mL
  Filled 2019-05-29: qty 10

## 2019-05-29 MED ORDER — PALONOSETRON HCL INJECTION 0.25 MG/5ML
0.2500 mg | Freq: Once | INTRAVENOUS | Status: AC
  Administered 2019-05-29: 0.25 mg via INTRAVENOUS

## 2019-05-29 MED ORDER — SODIUM CHLORIDE 0.9% FLUSH
10.0000 mL | Freq: Once | INTRAVENOUS | Status: AC
  Administered 2019-05-29: 10 mL
  Filled 2019-05-29: qty 10

## 2019-05-29 NOTE — Progress Notes (Signed)
Spoke to pt this am before treatment started & asked if he had any questions.  He states that he & his wife took good notes with Ed class & has read up on things.  Education packet given & he reports no questions at this time.  Reviewed packet.

## 2019-05-29 NOTE — Patient Instructions (Addendum)
[facility] Discharge Instructions for Patients Receiving Chemotherapy  Today you received the following chemotherapy agents Gemcitabine (GEMZAR) & Cisplatin (PLATINOL).  To help prevent nausea and vomiting after your treatment, we encourage you to take your nausea medication as prescribed.  If you develop nausea and vomiting that is not controlled by your nausea medication, call the clinic.   BELOW ARE SYMPTOMS THAT SHOULD BE REPORTED IMMEDIATELY:  *FEVER GREATER THAN 100.5 F  *CHILLS WITH OR WITHOUT FEVER  NAUSEA AND VOMITING THAT IS NOT CONTROLLED WITH YOUR NAUSEA MEDICATION  *UNUSUAL SHORTNESS OF BREATH  *UNUSUAL BRUISING OR BLEEDING  TENDERNESS IN MOUTH AND THROAT WITH OR WITHOUT PRESENCE OF ULCERS  *URINARY PROBLEMS  *BOWEL PROBLEMS  UNUSUAL RASH Items with * indicate a potential emergency and should be followed up as soon as possible.  Feel free to call the clinic should you have any questions or concerns. The clinic phone number is [phone].  Please show the [place] at check-in to the Emergency Department and triage nurse.  Gemcitabine injection What is this medicine? GEMCITABINE (jem SYE ta been) is a chemotherapy drug. This medicine is used to treat many types of cancer like breast cancer, lung cancer, pancreatic cancer, and ovarian cancer. This medicine may be used for other purposes; ask your health care provider or pharmacist if you have questions. COMMON BRAND NAME(S): Gemzar, Infugem What should I tell my health care provider before I take this medicine? They need to know if you have any of these conditions: -blood disorders -infection -kidney disease -liver disease -lung or breathing disease, like asthma -recent or ongoing radiation therapy -an unusual or allergic reaction to gemcitabine, other chemotherapy, other medicines, foods, dyes, or preservatives -pregnant or trying to get pregnant -breast-feeding How should I use  this medicine? This drug is given as an infusion into a vein. It is administered in a hospital or clinic by a specially trained health care professional. Talk to your pediatrician regarding the use of this medicine in children. Special care may be needed. Overdosage: If you think you have taken too much of this medicine contact a poison control center or emergency room at once. NOTE: This medicine is only for you. Do not share this medicine with others. What if I miss a dose? It is important not to miss your dose. Call your doctor or health care professional if you are unable to keep an appointment. What may interact with this medicine? -medicines to increase blood counts like filgrastim, pegfilgrastim, sargramostim -some other chemotherapy drugs like cisplatin -vaccines Talk to your doctor or health care professional before taking any of these medicines: -acetaminophen -aspirin -ibuprofen -ketoprofen -naproxen This list may not describe all possible interactions. Give your health care provider a list of all the medicines, herbs, non-prescription drugs, or dietary supplements you use. Also tell them if you smoke, drink alcohol, or use illegal drugs. Some items may interact with your medicine. What should I watch for while using this medicine? Visit your doctor for checks on your progress. This drug may make you feel generally unwell. This is not uncommon, as chemotherapy can affect healthy cells as well as cancer cells. Report any side effects. Continue your course of treatment even though you feel ill unless your doctor tells you to stop. In some cases, you may be given additional medicines to help with side effects. Follow all directions for their use. Call your doctor or health care professional for advice if you get a fever, chills  or sore throat, or other symptoms of a cold or flu. Do not treat yourself. This drug decreases your body's ability to fight infections. Try to avoid being around  people who are sick. This medicine may increase your risk to bruise or bleed. Call your doctor or health care professional if you notice any unusual bleeding. Be careful brushing and flossing your teeth or using a toothpick because you may get an infection or bleed more easily. If you have any dental work done, tell your dentist you are receiving this medicine. Avoid taking products that contain aspirin, acetaminophen, ibuprofen, naproxen, or ketoprofen unless instructed by your doctor. These medicines may hide a fever. Do not become pregnant while taking this medicine or for 6 months after stopping it. Women should inform their doctor if they wish to become pregnant or think they might be pregnant. Men should not father a child while taking this medicine and for 3 months after stopping it. There is a potential for serious side effects to an unborn child. Talk to your health care professional or pharmacist for more information. Do not breast-feed an infant while taking this medicine or for at least 1 week after stopping it. Men should inform their doctors if they wish to father a child. This medicine may lower sperm counts. Talk with your doctor or health care professional if you are concerned about your fertility. What side effects may I notice from receiving this medicine? Side effects that you should report to your doctor or health care professional as soon as possible: -allergic reactions like skin rash, itching or hives, swelling of the face, lips, or tongue -breathing problems -pain, redness, or irritation at site where injected -signs and symptoms of a dangerous change in heartbeat or heart rhythm like chest pain; dizziness; fast or irregular heartbeat; palpitations; feeling faint or lightheaded, falls; breathing problems -signs of decreased platelets or bleeding - bruising, pinpoint red spots on the skin, black, tarry stools, blood in the urine -signs of decreased red blood cells - unusually  weak or tired, feeling faint or lightheaded, falls -signs of infection - fever or chills, cough, sore throat, pain or difficulty passing urine -signs and symptoms of kidney injury like trouble passing urine or change in the amount of urine -signs and symptoms of liver injury like dark yellow or brown urine; general ill feeling or flu-like symptoms; light-colored stools; loss of appetite; nausea; right upper belly pain; unusually weak or tired; yellowing of the eyes or skin -swelling of ankles, feet, hands Side effects that usually do not require medical attention (report to your doctor or health care professional if they continue or are bothersome): -constipation -diarrhea -hair loss -loss of appetite -nausea -rash -vomiting This list may not describe all possible side effects. Call your doctor for medical advice about side effects. You may report side effects to FDA at 1-800-FDA-1088. Where should I keep my medicine? This drug is given in a hospital or clinic and will not be stored at home. NOTE: This sheet is a summary. It may not cover all possible information. If you have questions about this medicine, talk to your doctor, pharmacist, or health care provider.  2019 Elsevier/Gold Standard (2018-03-08 18:06:11)  Cisplatin injection What is this medicine? CISPLATIN (SIS pla tin) is a chemotherapy drug. It targets fast dividing cells, like cancer cells, and causes these cells to die. This medicine is used to treat many types of cancer like bladder, ovarian, and testicular cancers. This medicine may be used for other  purposes; ask your health care provider or pharmacist if you have questions. COMMON BRAND NAME(S): Platinol, Platinol -AQ What should I tell my health care provider before I take this medicine? They need to know if you have any of these conditions: -blood disorders -hearing problems -kidney disease -recent or ongoing radiation therapy -an unusual or allergic reaction to  cisplatin, carboplatin, other chemotherapy, other medicines, foods, dyes, or preservatives -pregnant or trying to get pregnant -breast-feeding How should I use this medicine? This drug is given as an infusion into a vein. It is administered in a hospital or clinic by a specially trained health care professional. Talk to your pediatrician regarding the use of this medicine in children. Special care may be needed. Overdosage: If you think you have taken too much of this medicine contact a poison control center or emergency room at once. NOTE: This medicine is only for you. Do not share this medicine with others. What if I miss a dose? It is important not to miss a dose. Call your doctor or health care professional if you are unable to keep an appointment. What may interact with this medicine? -dofetilide -foscarnet -medicines for seizures -medicines to increase blood counts like filgrastim, pegfilgrastim, sargramostim -probenecid -pyridoxine used with altretamine -rituximab -some antibiotics like amikacin, gentamicin, neomycin, polymyxin B, streptomycin, tobramycin -sulfinpyrazone -vaccines -zalcitabine Talk to your doctor or health care professional before taking any of these medicines: -acetaminophen -aspirin -ibuprofen -ketoprofen -naproxen This list may not describe all possible interactions. Give your health care provider a list of all the medicines, herbs, non-prescription drugs, or dietary supplements you use. Also tell them if you smoke, drink alcohol, or use illegal drugs. Some items may interact with your medicine. What should I watch for while using this medicine? Your condition will be monitored carefully while you are receiving this medicine. You will need important blood work done while you are taking this medicine. This drug may make you feel generally unwell. This is not uncommon, as chemotherapy can affect healthy cells as well as cancer cells. Report any side effects.  Continue your course of treatment even though you feel ill unless your doctor tells you to stop. In some cases, you may be given additional medicines to help with side effects. Follow all directions for their use. Call your doctor or health care professional for advice if you get a fever, chills or sore throat, or other symptoms of a cold or flu. Do not treat yourself. This drug decreases your body's ability to fight infections. Try to avoid being around people who are sick. This medicine may increase your risk to bruise or bleed. Call your doctor or health care professional if you notice any unusual bleeding. Be careful brushing and flossing your teeth or using a toothpick because you may get an infection or bleed more easily. If you have any dental work done, tell your dentist you are receiving this medicine. Avoid taking products that contain aspirin, acetaminophen, ibuprofen, naproxen, or ketoprofen unless instructed by your doctor. These medicines may hide a fever. Do not become pregnant while taking this medicine. Women should inform their doctor if they wish to become pregnant or think they might be pregnant. There is a potential for serious side effects to an unborn child. Talk to your health care professional or pharmacist for more information. Do not breast-feed an infant while taking this medicine. Drink fluids as directed while you are taking this medicine. This will help protect your kidneys. Call your doctor or health  care professional if you get diarrhea. Do not treat yourself. What side effects may I notice from receiving this medicine? Side effects that you should report to your doctor or health care professional as soon as possible: -allergic reactions like skin rash, itching or hives, swelling of the face, lips, or tongue -signs of infection - fever or chills, cough, sore throat, pain or difficulty passing urine -signs of decreased platelets or bleeding - bruising, pinpoint red spots  on the skin, black, tarry stools, nosebleeds -signs of decreased red blood cells - unusually weak or tired, fainting spells, lightheadedness -breathing problems -changes in hearing -gout pain -low blood counts - This drug may decrease the number of white blood cells, red blood cells and platelets. You may be at increased risk for infections and bleeding. -nausea and vomiting -pain, swelling, redness or irritation at the injection site -pain, tingling, numbness in the hands or feet -problems with balance, movement -trouble passing urine or change in the amount of urine Side effects that usually do not require medical attention (report to your doctor or health care professional if they continue or are bothersome): -changes in vision -loss of appetite -metallic taste in the mouth or changes in taste This list may not describe all possible side effects. Call your doctor for medical advice about side effects. You may report side effects to FDA at 1-800-FDA-1088. Where should I keep my medicine? This drug is given in a hospital or clinic and will not be stored at home. NOTE: This sheet is a summary. It may not cover all possible information. If you have questions about this medicine, talk to your doctor, pharmacist, or health care provider.  2019 Elsevier/Gold Standard (2008-03-19 14:40:54)   Coronavirus (COVID-19) Are you at risk?  Are you at risk for the Coronavirus (COVID-19)?  To be considered HIGH RISK for Coronavirus (COVID-19), you have to meet the following criteria:  . Traveled to [place], [place], [place], [place] or [place]; or in the [place] to [place], [place], [place], or [place]; and have fever, cough, and shortness of breath within the last 2 weeks of travel OR . Been in close contact with a person diagnosed with COVID-19 within the last 2 weeks and have fever, cough, and shortness of breath . IF YOU DO NOT MEET THESE CRITERIA, YOU ARE CONSIDERED LOW RISK FOR  COVID-19.  What to do if you are HIGH RISK for COVID-19?  [name] If you are having a medical emergency, call 911. . Seek medical care right away. Before you go to a doctor's office, urgent care or emergency department, call ahead and tell them about your recent travel, contact with someone diagnosed with COVID-19, and your symptoms. You should receive instructions from your physician's office regarding next steps of care.  . When you arrive at healthcare provider, tell the healthcare staff immediately you have returned from visiting [place], [place], [place], [place] or [place]; or traveled in the [place] to [place], [place], [place], or [place]; in the last two weeks or you have been in close contact with a person diagnosed with COVID-19 in the last 2 weeks.   . Tell the health care staff about your symptoms: fever, cough and shortness of breath. . After you have been seen by a medical provider, you will be either: o Tested for (COVID-19) and discharged home on quarantine except to seek medical care if symptoms worsen, and asked to  - Stay home and avoid contact with others  until you get your results (4-5 days)  - Avoid travel on public transportation if possible (such as bus, train, or airplane) or o Sent to the Emergency Department by EMS for evaluation, COVID-19 testing, and possible admission depending on your condition and test results.  What to do if you are LOW RISK for COVID-19?  Reduce your risk of any infection by using the same precautions used for avoiding the common cold or flu:  [name] Wash your hands often with soap and warm water for at least 20 seconds.  If soap and water are not readily available, use an alcohol-based hand sanitizer with at least 60% alcohol.  . If coughing or sneezing, cover your mouth and nose by coughing or sneezing into the elbow areas of your shirt or coat, into a tissue or into your sleeve (not your hands). . Avoid shaking hands with others and consider  head nods or verbal greetings only. . Avoid touching your eyes, nose, or mouth with unwashed hands.  . Avoid close contact with people who are sick. . Avoid places or events with large numbers of people in one location, like concerts or sporting events. . Carefully consider travel plans you have or are making. . If you are planning any travel outside or inside the [place], visit the CDC's Travelers' Health webpage for the latest health notices. . If you have some symptoms but not all symptoms, continue to monitor at home and seek medical attention if your symptoms worsen. . If you are having a medical emergency, call 911.   [place] / e-Visit: [email or web address]         [facility]: [facility]: [phone]                   [facility]: [phone]

## 2019-05-30 ENCOUNTER — Other Ambulatory Visit: Payer: Medicare Other

## 2019-05-30 ENCOUNTER — Telehealth: Payer: Self-pay | Admitting: *Deleted

## 2019-05-30 ENCOUNTER — Ambulatory Visit (HOSPITAL_COMMUNITY)
Admission: RE | Admit: 2019-05-30 | Discharge: 2019-05-30 | Disposition: A | Discharge status: Home / Self Care | Payer: Medicare Other | Source: Ambulatory Visit | Attending: Oncology | Admitting: Oncology

## 2019-05-30 DIAGNOSIS — N400 Enlarged prostate without lower urinary tract symptoms: Secondary | ICD-10-CM | POA: Diagnosis not present

## 2019-05-30 DIAGNOSIS — Z79899 Other long term (current) drug therapy: Secondary | ICD-10-CM | POA: Insufficient documentation

## 2019-05-30 DIAGNOSIS — I77811 Abdominal aortic ectasia: Secondary | ICD-10-CM | POA: Insufficient documentation

## 2019-05-30 DIAGNOSIS — C679 Malignant neoplasm of bladder, unspecified: Secondary | ICD-10-CM | POA: Diagnosis not present

## 2019-05-30 DIAGNOSIS — I700 Atherosclerosis of aorta: Secondary | ICD-10-CM | POA: Insufficient documentation

## 2019-05-30 LAB — GLUCOSE, CAPILLARY: Glucose-Capillary: 244 mg/dL — ABNORMAL HIGH (ref 70–99)

## 2019-05-30 MED ORDER — FLUDEOXYGLUCOSE F - 18 (FDG) INJECTION
11.0000 | Freq: Once | INTRAVENOUS | Status: AC | PRN
  Administered 2019-05-30: 11 via INTRAVENOUS

## 2019-05-30 NOTE — Telephone Encounter (Signed)
Called Tami Ribas for chemotherapy F/U.  Patient is doing well.  Denies n/v.  Denies any new side effects or symptoms.  Bowel and bladder functioning well.  Eating and drinking well.  Instructed to drink 64 oz minimum daily or at least the day before, of and after treatment.   Asked if he "Will experience side effects later on and when.  Did I received only saline because I feel like I have not received a thing.  Do me a favor and call me to come in to receive the real thing."  Denies further questions or needs at this time.  Encouraged to call [phone] Mon -Fri 8:00 am - 4:30 pm or anytime as needed for symptoms, changes or event outside office hours.

## 2019-05-30 NOTE — Telephone Encounter (Signed)
-----   Message from Georgianne Fick, RN sent at 05/29/2019  4:25 PM EDT ----- Regarding: Dr. Alen Blew First Time Gemzar & Cisplatin Patient received first time Gemzar and Cisplatin today and tolerated these well.

## 2019-05-31 ENCOUNTER — Telehealth: Payer: Self-pay

## 2019-05-31 NOTE — Telephone Encounter (Signed)
First time chemo follow up call. Spoke with patient sig other Cherie and she stated that the patient was resting. She stated that he has been doing fine but then felt nauseas this morning so took his prn medication which made him sleepy so he has been resting. She stated that she gave him Senna-S and he had a BM. She did state that his CBG readings have been higher since his treatment. Explained that he received steroids during treatment as part of his premeds and this will affect the blood sugar. Encouraged her to help the patient keep a log of his CBG reading and units of insulin dosed that they can then follow up with PCP to see if any changes are needed to his diabetes management. She verbalized understanding and stated that she will make a follow up appt with PCP. No other questions or concerns and understand to call if any arise.

## 2019-06-05 ENCOUNTER — Inpatient Hospital Stay: Payer: Medicare Other

## 2019-06-05 ENCOUNTER — Other Ambulatory Visit: Payer: Self-pay

## 2019-06-05 ENCOUNTER — Inpatient Hospital Stay (HOSPITAL_BASED_OUTPATIENT_CLINIC_OR_DEPARTMENT_OTHER): Payer: Medicare Other | Admitting: Oncology

## 2019-06-05 VITALS — BP 138/83 | HR 70 | Temp 98.5°F | Resp 17 | Ht 69.0 in | Wt 219.5 lb

## 2019-06-05 DIAGNOSIS — Z79899 Other long term (current) drug therapy: Secondary | ICD-10-CM

## 2019-06-05 DIAGNOSIS — C670 Malignant neoplasm of trigone of bladder: Secondary | ICD-10-CM

## 2019-06-05 DIAGNOSIS — Z95828 Presence of other vascular implants and grafts: Secondary | ICD-10-CM

## 2019-06-05 DIAGNOSIS — I700 Atherosclerosis of aorta: Secondary | ICD-10-CM

## 2019-06-05 DIAGNOSIS — I77811 Abdominal aortic ectasia: Secondary | ICD-10-CM

## 2019-06-05 DIAGNOSIS — N400 Enlarged prostate without lower urinary tract symptoms: Secondary | ICD-10-CM | POA: Diagnosis not present

## 2019-06-05 DIAGNOSIS — C679 Malignant neoplasm of bladder, unspecified: Secondary | ICD-10-CM

## 2019-06-05 DIAGNOSIS — R5383 Other fatigue: Secondary | ICD-10-CM

## 2019-06-05 DIAGNOSIS — Z5111 Encounter for antineoplastic chemotherapy: Secondary | ICD-10-CM | POA: Diagnosis not present

## 2019-06-05 LAB — CBC WITH DIFFERENTIAL (CANCER CENTER ONLY)
Abs Immature Granulocytes: 0 10*3/uL (ref 0.00–0.07)
Basophils Absolute: 0 10*3/uL (ref 0.0–0.1)
Basophils Relative: 2 %
Eosinophils Absolute: 0 10*3/uL (ref 0.0–0.5)
Eosinophils Relative: 2 %
HCT: 38.2 % — ABNORMAL LOW (ref 39.0–52.0)
Hemoglobin: 13.1 g/dL (ref 13.0–17.0)
Immature Granulocytes: 0 %
Lymphocytes Relative: 56 %
Lymphs Abs: 0.9 10*3/uL (ref 0.7–4.0)
MCH: 31.2 pg (ref 26.0–34.0)
MCHC: 34.3 g/dL (ref 30.0–36.0)
MCV: 91 fL (ref 80.0–100.0)
Monocytes Absolute: 0 10*3/uL — ABNORMAL LOW (ref 0.1–1.0)
Monocytes Relative: 2 %
Neutro Abs: 0.6 10*3/uL — ABNORMAL LOW (ref 1.7–7.7)
Neutrophils Relative %: 38 %
Platelet Count: 125 10*3/uL — ABNORMAL LOW (ref 150–400)
RBC: 4.2 MIL/uL — ABNORMAL LOW (ref 4.22–5.81)
RDW: 12 % (ref 11.5–15.5)
WBC Count: 1.7 10*3/uL — ABNORMAL LOW (ref 4.0–10.5)
nRBC: 0 % (ref 0.0–0.2)

## 2019-06-05 LAB — CMP (CANCER CENTER ONLY)
ALT: 25 U/L (ref 0–44)
AST: 23 U/L (ref 15–41)
Albumin: 3.5 g/dL (ref 3.5–5.0)
Alkaline Phosphatase: 53 U/L (ref 38–126)
Anion gap: 9 (ref 5–15)
BUN: 18 mg/dL (ref 8–23)
CO2: 25 mmol/L (ref 22–32)
Calcium: 9.1 mg/dL (ref 8.9–10.3)
Chloride: 97 mmol/L — ABNORMAL LOW (ref 98–111)
Creatinine: 1.36 mg/dL — ABNORMAL HIGH (ref 0.61–1.24)
GFR, Est AFR Am: 59 mL/min — ABNORMAL LOW (ref >60)
GFR, Estimated: 51 mL/min — ABNORMAL LOW (ref >60)
Glucose, Bld: 289 mg/dL — ABNORMAL HIGH (ref 70–99)
Potassium: 4.4 mmol/L (ref 3.5–5.1)
Sodium: 131 mmol/L — ABNORMAL LOW (ref 135–145)
Total Bilirubin: 0.5 mg/dL (ref 0.3–1.2)
Total Protein: 6.7 g/dL (ref 6.5–8.1)

## 2019-06-05 MED ORDER — HEPARIN SOD (PORK) LOCK FLUSH 100 UNIT/ML IV SOLN
500.0000 [IU] | Freq: Once | INTRAVENOUS | Status: AC
  Administered 2019-06-05: 500 [IU]
  Filled 2019-06-05: qty 5

## 2019-06-05 MED ORDER — SODIUM CHLORIDE 0.9% FLUSH
10.0000 mL | Freq: Once | INTRAVENOUS | Status: AC
  Administered 2019-06-05: 08:00:00 10 mL
  Filled 2019-06-05: qty 10

## 2019-06-05 MED ORDER — SODIUM CHLORIDE 0.9% FLUSH
10.0000 mL | Freq: Once | INTRAVENOUS | Status: AC
  Administered 2019-06-05: 09:00:00 10 mL
  Filled 2019-06-05: qty 10

## 2019-06-05 NOTE — Patient Instructions (Signed)
[facility] °Discharge Instructions for Patients Receiving Chemotherapy ° °Today you received the following chemotherapy agents Gemzar ° °To help prevent nausea and vomiting after your treatment, we encourage you to take your nausea medication as directed. °  °If you develop nausea and vomiting that is not controlled by your nausea medication, call the clinic.  ° °BELOW ARE SYMPTOMS THAT SHOULD BE REPORTED IMMEDIATELY: °· *FEVER GREATER THAN 100.5 F °· *CHILLS WITH OR WITHOUT FEVER °· NAUSEA AND VOMITING THAT IS NOT CONTROLLED WITH YOUR NAUSEA MEDICATION °· *UNUSUAL SHORTNESS OF BREATH °· *UNUSUAL BRUISING OR BLEEDING °· TENDERNESS IN MOUTH AND THROAT WITH OR WITHOUT PRESENCE OF ULCERS °· *URINARY PROBLEMS °· *BOWEL PROBLEMS °· UNUSUAL RASH °Items with * indicate a potential emergency and should be followed up as soon as possible. ° °Feel free to call the clinic should you have any questions or concerns. The clinic phone number is [phone]. ° °Please show the CHEMO ALERT CARD at check-in to the Emergency Department and triage nurse. ° ° °

## 2019-06-05 NOTE — Progress Notes (Signed)
No treatment today due to [place] 0.6 per Dr. Alen Blew. He will schedule [id number] in 2 weeks and scheduling message sent. Patient educated on neutropenic precautions prior to DC.

## 2019-06-05 NOTE — Progress Notes (Signed)
Hematology and Oncology Follow Up Visit  Gregory Cummings [id number] 04-20-1946 73 y.o. 06/05/2019 8:22 AM [name], MDStoneking, Christiane Ha, MD   Principle Diagnosis: 73 year old man with bladder cancer.  He was found to have a multifocal T2N0 disease with high-grade urothelial carcinoma and 30% squamous cell differentiation and 10% glandular component.  This was diagnosed in April 2020.   Prior Therapy:  TURBT on April 12, 2019 which confirmed the diagnosis.  Current therapy: Neoadjuvant chemotherapy utilizing gemcitabine and cisplatin started on 05/29/2019.  He is here for day 8 of cycle 1.  Interim History: [name] presents today for a follow-up visit.  Since the last visit, he received a day 1 of cycle 1 of chemotherapy without any complications.  He did report some mild fatigue but no nausea, vomiting or neuropathy.  He remains active and continues to attend activities of daily living.  He did not report any changes in appetite or urinary difficulties.  His performance status and quality of life remains unchanged.  He does not report any headaches, blurry vision, syncope or seizures. Does not report any fevers, chills or sweats.  Does not report any cough, wheezing or hemoptysis.  Does not report any chest pain, palpitation, orthopnea or leg edema.  Does not report any nausea, vomiting or abdominal pain.  Does not report any constipation or diarrhea.  Does not report any skeletal complaints.    Does not report frequency, urgency or hematuria.  Does not report any skin rashes or lesions. Does not report any heat or cold intolerance.  Does not report any lymphadenopathy or petechiae.  Does not report any anxiety or depression.  Remaining review of systems is negative.    Medications: I have reviewed the patient's current medications.  Current Outpatient Medications  Medication Sig Dispense Refill  . aspirin 81 MG tablet Take 81 mg by mouth daily.    . famotidine (PEPCID) 10 MG tablet Take 10  mg by mouth daily as needed for heartburn or indigestion.    . fenofibrate (TRICOR) 145 MG tablet Take 145 mg by mouth every evening.     . insulin aspart (NOVOLOG) 100 UNIT/ML injection Inject 0-60 Units into the skin 3 (three) times daily.    [name] lidocaine-prilocaine (EMLA) cream Apply 1 application topically as needed. 30 g 0  . lisinopril (PRINIVIL,ZESTRIL) 2.5 MG tablet Take 2.5 mg by mouth daily.    . phenazopyridine (PYRIDIUM) 200 MG tablet Take 1 tablet (200 mg total) by mouth 3 (three) times daily as needed for pain. (Patient not taking: Reported on 05/17/2019) 10 tablet 0  . prochlorperazine (COMPAZINE) 10 MG tablet Take 1 tablet (10 mg total) by mouth every 6 (six) hours as needed for nausea or vomiting. 30 tablet 0  . simvastatin (ZOCOR) 20 MG tablet Take 20 mg by mouth every evening.     . traMADol (ULTRAM) 50 MG tablet Take 1-2 tablets (50-100 mg total) by mouth every 6 (six) hours as needed for moderate pain. (Patient not taking: Reported on 05/17/2019) 15 tablet 0   No current facility-administered medications for this visit.      Allergies: No Known Allergies  Past Medical History, Surgical history, Social history, and Family History were reviewed and updated.  [name]  Physical Exam: Blood pressure 138/83, pulse 70, temperature 98.5 F (36.9 C), temperature source Oral, resp. rate 17, height 5\' 9"  (1.753 m), weight 219 lb 8 oz (99.6 kg), SpO2 99 %.   ECOG: 0 General appearance: alert and cooperative appeared without  distress. Head: Normocephalic, without obvious abnormality Oropharynx: No oral thrush or ulcers. Eyes: No scleral icterus.  Pupils are equal and round reactive to light. Lymph nodes: Cervical, supraclavicular, and axillary nodes normal. Heart:regular rate and rhythm, S1, S2 normal, no murmur, click, rub or gallop Lung:chest clear, no wheezing, rales, normal symmetric air entry Abdomin: soft, non-tender, without masses or organomegaly. Neurological: No motor,  sensory deficits.  Intact deep tendon reflexes. Skin: No rashes or lesions.  No ecchymosis or petechiae. Musculoskeletal: No joint deformity or effusion. Psychiatric: Mood and affect are appropriate.    Lab Results: Lab Results  Component Value Date   WBC 4.3 05/29/2019   HGB 14.2 05/29/2019   HCT 42.1 05/29/2019   MCV 92.9 05/29/2019   PLT 224 05/29/2019     Chemistry      Component Value Date/Time   NA 135 05/29/2019 0833   K 4.3 05/29/2019 0833   CL 103 05/29/2019 0833   CO2 24 05/29/2019 0833   BUN 23 05/29/2019 0833   CREATININE 1.36 (H) 05/29/2019 0833      Component Value Date/Time   CALCIUM 9.1 05/29/2019 0833   ALKPHOS 64 05/29/2019 0833   AST 24 05/29/2019 0833   ALT 23 05/29/2019 0833   BILITOT 0.5 05/29/2019 1610       Radiological Studies: EXAM: NUCLEAR MEDICINE PET SKULL BASE TO THIGH  TECHNIQUE: 11.0 mCi F-18 FDG was injected intravenously. Full-ring PET imaging was performed from the skull base to thigh after the radiotracer. CT data was obtained and used for attenuation correction and anatomic localization.  Fasting blood glucose: 244 mg/dl  COMPARISON:  03/23/2019 CT abdomen/pelvis.  FINDINGS: Mediastinal blood pool activity: SUV max 2.0  Liver activity: SUV max NA  NECK: No hypermetabolic lymph nodes in the neck.  Incidental CT findings: Right internal jugular Port-A-Cath terminates at the cavoatrial junction.  CHEST: No enlarged or hypermetabolic axillary, mediastinal or hilar lymph nodes. No hypermetabolic pulmonary findings.  Incidental CT findings: No acute consolidative airspace disease, lung masses or significant pulmonary nodules. Three-vessel coronary atherosclerosis. Atherosclerotic nonaneurysmal thoracic aorta. Dilated main pulmonary artery (3.5 cm diameter). Subcutaneous loop recorder in medial ventral left chest wall.  ABDOMEN/PELVIS: No abnormal hypermetabolic activity within the liver, pancreas, adrenal  glands, or spleen. No hypermetabolic lymph nodes in the abdomen or pelvis. Ureters and bladder obscured by urinary hypermetabolism.  Incidental CT findings: Mildly enlarged prostate. Bladder collapsed and not well evaluated on the noncontrast CT images, with no appreciable focal bladder abnormality. 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  SKELETON: No focal hypermetabolic activity to suggest skeletal metastasis.  Incidental CT findings: none  IMPRESSION: 1. No hypermetabolic locoregional adenopathy or distant metastatic disease. 2. Mild prostatomegaly. 3. 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 Recommend follow-up aortic ultrasound in 5 years. This recommendation follows ACR consensus guidelines: White Paper of the ACR Incidental Findings Committee II on Vascular Findings. J Am Coll Radiol 2013; 96:045-409. 4. Aortic Atherosclerosis (ICD10-I70.0). Additional chronic findings as detailed.    Impression and Plan:  73 year old man with:  1.    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    He did have 30% squamous cell differentiation as well as 10% glandular component.  He has disease of the bladder without any evidence of metastasis.  PET CT scan obtained on 05/30/2019 was personally reviewed and discussed with the patient today.  He has no evidence of metastatic disease at this time  and his disease is localized.  Risks and benefits of continuing neoadjuvant chemotherapy was reviewed and is agreeable to continue.  Plan is to proceed with 3-4 cycles of therapy.   2. IV access:Port-A-Cath placed without any complications.  This will continue to be in use.  3. Antiemetics: Compazine is available to him without any issues.  4. Renal function surveillance:Creatinine clearance is slightly decreased we will monitor closely at this time.  Will adjust cisplatin  dose accordingly.  5. Goals of care:  His disease remains curable and aggressive therapy is warranted.  6. Follow-up: In 2 weeks to start cycle 2 of therapy.  25  minutes was spent with the patient face-to-face today.  More than 50% of time was spent on reviewing his disease status, treatment options, reviewing his imaging studies answering questions regarding future plan of care.   Zola Button, MD 6/9/20208:22 AM

## 2019-06-06 ENCOUNTER — Telehealth: Payer: Self-pay | Admitting: Oncology

## 2019-06-06 NOTE — Telephone Encounter (Signed)
I talk with patient caregiver regarding schedule

## 2019-06-18 ENCOUNTER — Inpatient Hospital Stay: Payer: Medicare Other

## 2019-06-18 ENCOUNTER — Inpatient Hospital Stay (HOSPITAL_BASED_OUTPATIENT_CLINIC_OR_DEPARTMENT_OTHER): Payer: Medicare Other | Admitting: Oncology

## 2019-06-18 ENCOUNTER — Other Ambulatory Visit: Payer: Self-pay

## 2019-06-18 VITALS — BP 138/77 | HR 63 | Temp 98.2°F | Resp 17 | Ht 69.0 in | Wt 218.9 lb

## 2019-06-18 DIAGNOSIS — C679 Malignant neoplasm of bladder, unspecified: Secondary | ICD-10-CM | POA: Diagnosis not present

## 2019-06-18 DIAGNOSIS — Z5111 Encounter for antineoplastic chemotherapy: Secondary | ICD-10-CM | POA: Diagnosis not present

## 2019-06-18 DIAGNOSIS — C670 Malignant neoplasm of trigone of bladder: Secondary | ICD-10-CM

## 2019-06-18 DIAGNOSIS — Z95828 Presence of other vascular implants and grafts: Secondary | ICD-10-CM

## 2019-06-18 LAB — CBC WITH DIFFERENTIAL (CANCER CENTER ONLY)
Abs Immature Granulocytes: 0.04 10*3/uL (ref 0.00–0.07)
Basophils Absolute: 0 10*3/uL (ref 0.0–0.1)
Basophils Relative: 1 %
Eosinophils Absolute: 0.1 10*3/uL (ref 0.0–0.5)
Eosinophils Relative: 3 %
HCT: 39 % (ref 39.0–52.0)
Hemoglobin: 13.1 g/dL (ref 13.0–17.0)
Immature Granulocytes: 1 %
Lymphocytes Relative: 48 %
Lymphs Abs: 1.4 10*3/uL (ref 0.7–4.0)
MCH: 31.2 pg (ref 26.0–34.0)
MCHC: 33.6 g/dL (ref 30.0–36.0)
MCV: 92.9 fL (ref 80.0–100.0)
Monocytes Absolute: 0.6 10*3/uL (ref 0.1–1.0)
Monocytes Relative: 22 %
Neutro Abs: 0.7 10*3/uL — ABNORMAL LOW (ref 1.7–7.7)
Neutrophils Relative %: 25 %
Platelet Count: 367 10*3/uL (ref 150–400)
RBC: 4.2 MIL/uL — ABNORMAL LOW (ref 4.22–5.81)
RDW: 13.2 % (ref 11.5–15.5)
WBC Count: 2.9 10*3/uL — ABNORMAL LOW (ref 4.0–10.5)
nRBC: 0 % (ref 0.0–0.2)

## 2019-06-18 LAB — CMP (CANCER CENTER ONLY)
ALT: 26 U/L (ref 0–44)
AST: 28 U/L (ref 15–41)
Albumin: 3.6 g/dL (ref 3.5–5.0)
Alkaline Phosphatase: 54 U/L (ref 38–126)
Anion gap: 7 (ref 5–15)
BUN: 15 mg/dL (ref 8–23)
CO2: 25 mmol/L (ref 22–32)
Calcium: 9.1 mg/dL (ref 8.9–10.3)
Chloride: 104 mmol/L (ref 98–111)
Creatinine: 1.1 mg/dL (ref 0.61–1.24)
GFR, Est AFR Am: >60 mL/min (ref >60)
GFR, Estimated: >60 mL/min (ref >60)
Glucose, Bld: 142 mg/dL — ABNORMAL HIGH (ref 70–99)
Potassium: 4 mmol/L (ref 3.5–5.1)
Sodium: 136 mmol/L (ref 135–145)
Total Bilirubin: 0.4 mg/dL (ref 0.3–1.2)
Total Protein: 6.9 g/dL (ref 6.5–8.1)

## 2019-06-18 MED ORDER — HEPARIN SOD (PORK) LOCK FLUSH 100 UNIT/ML IV SOLN
500.0000 [IU] | Freq: Once | INTRAVENOUS | Status: AC
  Administered 2019-06-18: 500 [IU]
  Filled 2019-06-18: qty 5

## 2019-06-18 MED ORDER — SODIUM CHLORIDE 0.9% FLUSH
10.0000 mL | Freq: Once | INTRAVENOUS | Status: AC
  Administered 2019-06-18: 10 mL
  Filled 2019-06-18: qty 10

## 2019-06-18 NOTE — Addendum Note (Signed)
Addended by: Wyatt Portela on: 06/18/2019 08:59 AM   Modules accepted: Orders

## 2019-06-18 NOTE — Progress Notes (Signed)
Per Dr. Hazeline Junker note patient did not receive Day 8 of Cycle 1 and he would like to continue with Cycle 2 on 6/24 ; therefore completed Day 8 Cycle 1.   Larene Beach, PharmD

## 2019-06-18 NOTE — Progress Notes (Signed)
Hematology and Oncology Follow Up Visit  Gregory Cummings [id number] 21-Nov-1946 73 y.o. 06/18/2019 8:53 AM [name], MDStoneking, [name], MD   Principle Diagnosis: 73 year old man with T2N0 high-grade urothelial carcinoma of the bladder diagnosed in April 2020.  He was found to have disease with high-grade urothelial carcinoma and 30% squamous cell differentiation and 10% glandular component.     Prior Therapy:  TURBT on April 12, 2019 which confirmed the diagnosis.  Current therapy: Neoadjuvant chemotherapy utilizing gemcitabine and cisplatin started on 05/29/2019.  He is here for evaluation prior to cycle 2 of therapy.  Interim History: Mr. Cummings returns today for a repeat evaluation.  Since the last visit, he completed the first cycle of therapy without any major complications.  He did not receive day 8 of therapy because of neutropenia.  He denies any nausea, fatigue or peripheral neuropathy.  His performance status and quality of life remains unchanged.  He denies any worsening neuropathy or hearing deficits.  Continues to work but had to pace himself periodically.  He denied any alteration mental status, neuropathy, confusion or dizziness.  Denies any headaches or lethargy.  Denies any night sweats, weight loss or changes in appetite.  Denied orthopnea, dyspnea on exertion or chest discomfort.  Denies shortness of breath, difficulty breathing hemoptysis or cough.  Denies any abdominal distention, nausea, early satiety or dyspepsia.  Denies any hematuria, frequency, dysuria or nocturia.  Denies any skin irritation, dryness or rash.  Denies any ecchymosis or petechiae.  Denies any lymphadenopathy or clotting.  Denies any heat or cold intolerance.  Denies any anxiety or depression.  Remaining review of system is negative.        Medications: I have reviewed the patient's current medications.  Current Outpatient Medications  Medication Sig Dispense Refill  . aspirin 81 MG tablet Take 81  mg by mouth daily.    . famotidine (PEPCID) 10 MG tablet Take 10 mg by mouth daily as needed for heartburn or indigestion.    . fenofibrate (TRICOR) 145 MG tablet Take 145 mg by mouth every evening.     . insulin aspart (NOVOLOG) 100 UNIT/ML injection Inject 0-60 Units into the skin 3 (three) times daily.    Gregory Cummings lidocaine-prilocaine (EMLA) cream Apply 1 application topically as needed. 30 g 0  . lisinopril (PRINIVIL,ZESTRIL) 2.5 MG tablet Take 2.5 mg by mouth daily.    . phenazopyridine (PYRIDIUM) 200 MG tablet Take 1 tablet (200 mg total) by mouth 3 (three) times daily as needed for pain. (Patient not taking: Reported on 05/17/2019) 10 tablet 0  . prochlorperazine (COMPAZINE) 10 MG tablet Take 1 tablet (10 mg total) by mouth every 6 (six) hours as needed for nausea or vomiting. 30 tablet 0  . simvastatin (ZOCOR) 20 MG tablet Take 20 mg by mouth every evening.     . traMADol (ULTRAM) 50 MG tablet Take 1-2 tablets (50-100 mg total) by mouth every 6 (six) hours as needed for moderate pain. (Patient not taking: Reported on 05/17/2019) 15 tablet 0   No current facility-administered medications for this visit.      Allergies: No Known Allergies  Past Medical History, Surgical history, Social history, and Family History were reviewed and updated.  Gregory Cummings  Physical Exam: Blood pressure 138/77, pulse 63, temperature 98.2 F (36.8 C), temperature source Oral, resp. rate 17, height 5\' 9"  (1.753 m), weight 218 lb 14.4 oz (99.3 kg), SpO2 99 %.   ECOG: 0    General appearance: Comfortable appearing without any discomfort  Head: Normocephalic without any trauma Oropharynx: Mucous membranes are moist and pink without any thrush or ulcers. Eyes: Pupils are equal and round reactive to light. Lymph nodes: No cervical, supraclavicular, inguinal or axillary lymphadenopathy.   Heart:regular rate and rhythm.  S1 and S2 without leg edema. Lung: Clear without any rhonchi or wheezes.  No dullness to  percussion. Abdomin: Soft, nontender, nondistended with good bowel sounds.  No hepatosplenomegaly. Musculoskeletal: No joint deformity or effusion.  Full range of motion noted. Neurological: No deficits noted on motor, sensory and deep tendon reflex exam. Skin: No petechial rash or dryness.  Appeared moist.      Lab Results: Lab Results  Component Value Date   WBC 2.9 (L) 06/18/2019   HGB 13.1 06/18/2019   HCT 39.0 06/18/2019   MCV 92.9 06/18/2019   PLT 367 06/18/2019     Chemistry      Component Value Date/Time   NA 131 (L) 06/05/2019 0751   K 4.4 06/05/2019 0751   CL 97 (L) 06/05/2019 0751   CO2 25 06/05/2019 0751   BUN 18 06/05/2019 0751   CREATININE 1.36 (H) 06/05/2019 0751      Component Value Date/Time   CALCIUM 9.1 06/05/2019 0751   ALKPHOS 53 06/05/2019 0751   AST 23 06/05/2019 0751   ALT 25 06/05/2019 0751   BILITOT 0.5 06/05/2019 0751         Impression and Plan:  73 year old man with:  1.    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      He continues to tolerate chemotherapy without any major complications.  Risks and benefits of proceeding with cycle 2 of therapy was discussed.  He is agreeable to proceed at this time.  Although his white cell count has not completely recovered anticipate full recovery in the next 24 to 48 hours.  Complications from ongoing chemotherapy was reviewed include neutropenia, fatigue and nausea.  He is agreeable to continue and the plan is to finish 4 cycles of therapy.   2. IV access:Port-A-Cath continues to be in use without any issues.  3. Antiemetics: Mild nausea noted and Compazine is effective at this time.  4. Renal function surveillance:Kidney function will continue to be monitored.  Creatinine clearance is close to 60 cc/min.  5. Goals of care:  Therapy remains curative at this time and aggressive measures are warranted.  6.  Neutropenia: At this time we  will continue to monitor and potentially add growth factor support if needed.  7. Follow-up: We will be on 624 for the start of cycle 2 of therapy.  He will have MD follow-up in 3 weeks to start cycle 3.   25  minutes was spent with the patient face-to-face today.  More than 50% of time was dedicated to discussing the natural course of his disease, treatment approach, complications related therapy and management options.   Zola Button, MD 6/22/20208:53 AM

## 2019-06-19 ENCOUNTER — Telehealth: Payer: Self-pay | Admitting: Oncology

## 2019-06-19 NOTE — Telephone Encounter (Signed)
Scheduled per los. Mailed printout  °

## 2019-06-20 ENCOUNTER — Other Ambulatory Visit: Payer: Self-pay

## 2019-06-20 ENCOUNTER — Inpatient Hospital Stay: Payer: Medicare Other

## 2019-06-20 VITALS — BP 129/75 | HR 68 | Temp 98.5°F | Resp 17

## 2019-06-20 DIAGNOSIS — C679 Malignant neoplasm of bladder, unspecified: Secondary | ICD-10-CM

## 2019-06-20 DIAGNOSIS — Z5111 Encounter for antineoplastic chemotherapy: Secondary | ICD-10-CM | POA: Diagnosis not present

## 2019-06-20 MED ORDER — POTASSIUM CHLORIDE 2 MEQ/ML IV SOLN
Freq: Once | INTRAVENOUS | Status: AC
  Administered 2019-06-20: 09:00:00 via INTRAVENOUS
  Filled 2019-06-20: qty 10

## 2019-06-20 MED ORDER — PALONOSETRON HCL INJECTION 0.25 MG/5ML
INTRAVENOUS | Status: AC
  Filled 2019-06-20: qty 5

## 2019-06-20 MED ORDER — SODIUM CHLORIDE 0.9 % IV SOLN
Freq: Once | INTRAVENOUS | Status: AC
  Administered 2019-06-20: 09:00:00 via INTRAVENOUS
  Filled 2019-06-20: qty 250

## 2019-06-20 MED ORDER — SODIUM CHLORIDE 0.9 % IV SOLN
70.0000 mg/m2 | Freq: Once | INTRAVENOUS | Status: AC
  Administered 2019-06-20: 154 mg via INTRAVENOUS
  Filled 2019-06-20: qty 154

## 2019-06-20 MED ORDER — SODIUM CHLORIDE 0.9 % IV SOLN
Freq: Once | INTRAVENOUS | Status: AC
  Administered 2019-06-20: 11:00:00 via INTRAVENOUS
  Filled 2019-06-20: qty 5

## 2019-06-20 MED ORDER — PALONOSETRON HCL INJECTION 0.25 MG/5ML
0.2500 mg | Freq: Once | INTRAVENOUS | Status: AC
  Administered 2019-06-20: 0.25 mg via INTRAVENOUS

## 2019-06-20 MED ORDER — SODIUM CHLORIDE 0.9 % IV SOLN
1000.0000 mg/m2 | Freq: Once | INTRAVENOUS | Status: AC
  Administered 2019-06-20: 2204 mg via INTRAVENOUS
  Filled 2019-06-20: qty 57.97

## 2019-06-20 MED ORDER — HEPARIN SOD (PORK) LOCK FLUSH 100 UNIT/ML IV SOLN
500.0000 [IU] | Freq: Once | INTRAVENOUS | Status: AC | PRN
  Administered 2019-06-20: 500 [IU]
  Filled 2019-06-20: qty 5

## 2019-06-20 MED ORDER — SODIUM CHLORIDE 0.9% FLUSH
10.0000 mL | INTRAVENOUS | Status: DC | PRN
  Administered 2019-06-20: 10 mL
  Filled 2019-06-20: qty 10

## 2019-06-20 NOTE — Progress Notes (Signed)
Per Dr. Alen Blew, ok to treat with ANC 0.7.

## 2019-06-20 NOTE — Patient Instructions (Signed)
[facility] Discharge Instructions for Patients Receiving Chemotherapy  Today you received the following chemotherapy agents: Gemzar, cisplatin   To help prevent nausea and vomiting after your treatment, we encourage you to take your nausea medication as directed.    If you develop nausea and vomiting that is not controlled by your nausea medication, call the clinic.   BELOW ARE SYMPTOMS THAT SHOULD BE REPORTED IMMEDIATELY:  *FEVER GREATER THAN 100.5 F  *CHILLS WITH OR WITHOUT FEVER  NAUSEA AND VOMITING THAT IS NOT CONTROLLED WITH YOUR NAUSEA MEDICATION  *UNUSUAL SHORTNESS OF BREATH  *UNUSUAL BRUISING OR BLEEDING  TENDERNESS IN MOUTH AND THROAT WITH OR WITHOUT PRESENCE OF ULCERS  *URINARY PROBLEMS  *BOWEL PROBLEMS  UNUSUAL RASH Items with * indicate a potential emergency and should be followed up as soon as possible.  Feel free to call the clinic should you have any questions or concerns. The clinic phone number is [phone].  Please show the CHEMO ALERT CARD at check-in to the Emergency Department and triage nurse.   

## 2019-06-27 ENCOUNTER — Other Ambulatory Visit: Payer: Self-pay

## 2019-06-27 ENCOUNTER — Inpatient Hospital Stay: Payer: Medicare Other

## 2019-06-27 ENCOUNTER — Telehealth: Payer: Self-pay

## 2019-06-27 ENCOUNTER — Inpatient Hospital Stay: Payer: Medicare Other | Attending: Oncology

## 2019-06-27 VITALS — BP 140/80 | HR 65 | Temp 98.5°F | Resp 16

## 2019-06-27 DIAGNOSIS — C679 Malignant neoplasm of bladder, unspecified: Secondary | ICD-10-CM | POA: Insufficient documentation

## 2019-06-27 DIAGNOSIS — D701 Agranulocytosis secondary to cancer chemotherapy: Secondary | ICD-10-CM | POA: Insufficient documentation

## 2019-06-27 DIAGNOSIS — Z95828 Presence of other vascular implants and grafts: Secondary | ICD-10-CM

## 2019-06-27 DIAGNOSIS — T451X5A Adverse effect of antineoplastic and immunosuppressive drugs, initial encounter: Secondary | ICD-10-CM | POA: Diagnosis not present

## 2019-06-27 DIAGNOSIS — Z5111 Encounter for antineoplastic chemotherapy: Secondary | ICD-10-CM | POA: Diagnosis not present

## 2019-06-27 DIAGNOSIS — C670 Malignant neoplasm of trigone of bladder: Secondary | ICD-10-CM

## 2019-06-27 DIAGNOSIS — Z79899 Other long term (current) drug therapy: Secondary | ICD-10-CM | POA: Diagnosis not present

## 2019-06-27 LAB — CMP (CANCER CENTER ONLY)
ALT: 26 U/L (ref 0–44)
AST: 29 U/L (ref 15–41)
Albumin: 3.6 g/dL (ref 3.5–5.0)
Alkaline Phosphatase: 68 U/L (ref 38–126)
Anion gap: 10 (ref 5–15)
BUN: 21 mg/dL (ref 8–23)
CO2: 23 mmol/L (ref 22–32)
Calcium: 9.2 mg/dL (ref 8.9–10.3)
Chloride: 97 mmol/L — ABNORMAL LOW (ref 98–111)
Creatinine: 1.11 mg/dL (ref 0.61–1.24)
GFR, Est AFR Am: >60 mL/min (ref >60)
GFR, Estimated: >60 mL/min (ref >60)
Glucose, Bld: 178 mg/dL — ABNORMAL HIGH (ref 70–99)
Potassium: 4.5 mmol/L (ref 3.5–5.1)
Sodium: 130 mmol/L — ABNORMAL LOW (ref 135–145)
Total Bilirubin: 0.4 mg/dL (ref 0.3–1.2)
Total Protein: 7 g/dL (ref 6.5–8.1)

## 2019-06-27 LAB — CBC WITH DIFFERENTIAL (CANCER CENTER ONLY)
Abs Immature Granulocytes: 0 10*3/uL (ref 0.00–0.07)
Basophils Absolute: 0.1 10*3/uL (ref 0.0–0.1)
Basophils Relative: 2 %
Eosinophils Absolute: 0 10*3/uL (ref 0.0–0.5)
Eosinophils Relative: 0 %
HCT: 36.3 % — ABNORMAL LOW (ref 39.0–52.0)
Hemoglobin: 12.7 g/dL — ABNORMAL LOW (ref 13.0–17.0)
Immature Granulocytes: 0 %
Lymphocytes Relative: 57 %
Lymphs Abs: 1.6 10*3/uL (ref 0.7–4.0)
MCH: 31.6 pg (ref 26.0–34.0)
MCHC: 35 g/dL (ref 30.0–36.0)
MCV: 90.3 fL (ref 80.0–100.0)
Monocytes Absolute: 0.1 10*3/uL (ref 0.1–1.0)
Monocytes Relative: 3 %
Neutro Abs: 1.1 10*3/uL — ABNORMAL LOW (ref 1.7–7.7)
Neutrophils Relative %: 38 %
Platelet Count: 133 10*3/uL — ABNORMAL LOW (ref 150–400)
RBC: 4.02 MIL/uL — ABNORMAL LOW (ref 4.22–5.81)
RDW: 12.7 % (ref 11.5–15.5)
WBC Count: 2.8 10*3/uL — ABNORMAL LOW (ref 4.0–10.5)
nRBC: 0 % (ref 0.0–0.2)

## 2019-06-27 MED ORDER — PROCHLORPERAZINE MALEATE 10 MG PO TABS
ORAL_TABLET | ORAL | Status: AC
  Filled 2019-06-27: qty 1

## 2019-06-27 MED ORDER — SODIUM CHLORIDE 0.9 % IV SOLN
1000.0000 mg/m2 | Freq: Once | INTRAVENOUS | Status: AC
  Administered 2019-06-27: 2204 mg via INTRAVENOUS
  Filled 2019-06-27: qty 57.97

## 2019-06-27 MED ORDER — SODIUM CHLORIDE 0.9% FLUSH
10.0000 mL | Freq: Once | INTRAVENOUS | Status: AC
  Administered 2019-06-27: 10 mL
  Filled 2019-06-27: qty 10

## 2019-06-27 MED ORDER — HEPARIN SOD (PORK) LOCK FLUSH 100 UNIT/ML IV SOLN
500.0000 [IU] | Freq: Once | INTRAVENOUS | Status: AC | PRN
  Administered 2019-06-27: 500 [IU]
  Filled 2019-06-27: qty 5

## 2019-06-27 MED ORDER — PROCHLORPERAZINE MALEATE 10 MG PO TABS
10.0000 mg | ORAL_TABLET | Freq: Once | ORAL | Status: AC
  Administered 2019-06-27: 10 mg via ORAL

## 2019-06-27 MED ORDER — SODIUM CHLORIDE 0.9 % IV SOLN
Freq: Once | INTRAVENOUS | Status: AC
  Administered 2019-06-27: 10:00:00 via INTRAVENOUS
  Filled 2019-06-27: qty 250

## 2019-06-27 MED ORDER — SODIUM CHLORIDE 0.9% FLUSH
10.0000 mL | INTRAVENOUS | Status: DC | PRN
  Administered 2019-06-27: 10 mL
  Filled 2019-06-27: qty 10

## 2019-06-27 NOTE — Progress Notes (Signed)
Per Dr. Shadad, ok to treat with ANC 1.1. °

## 2019-06-27 NOTE — Patient Instructions (Signed)

## 2019-06-27 NOTE — Patient Instructions (Signed)
[facility] Discharge Instructions for Patients Receiving Chemotherapy  Today you received the following chemotherapy agents Gemzar To help prevent nausea and vomiting after your treatment, we encourage you to take your nausea medication as prescribed.  If you develop nausea and vomiting that is not controlled by your nausea medication, call the clinic.   BELOW ARE SYMPTOMS THAT SHOULD BE REPORTED IMMEDIATELY:  *FEVER GREATER THAN 100.5 F  *CHILLS WITH OR WITHOUT FEVER  NAUSEA AND VOMITING THAT IS NOT CONTROLLED WITH YOUR NAUSEA MEDICATION  *UNUSUAL SHORTNESS OF BREATH  *UNUSUAL BRUISING OR BLEEDING  TENDERNESS IN MOUTH AND THROAT WITH OR WITHOUT PRESENCE OF ULCERS  *URINARY PROBLEMS  *BOWEL PROBLEMS  UNUSUAL RASH Items with * indicate a potential emergency and should be followed up as soon as possible.  Feel free to call the clinic should you have any questions or concerns. The clinic phone number is [phone].  Please show the CHEMO ALERT CARD at check-in to the Emergency Department and triage nurse.   

## 2019-07-10 ENCOUNTER — Inpatient Hospital Stay: Payer: Medicare Other | Admitting: Oncology

## 2019-07-10 ENCOUNTER — Other Ambulatory Visit: Payer: Self-pay

## 2019-07-10 ENCOUNTER — Inpatient Hospital Stay: Payer: Medicare Other

## 2019-07-10 VITALS — BP 137/93 | HR 72 | Temp 98.7°F | Resp 17 | Ht 69.0 in | Wt 219.4 lb

## 2019-07-10 DIAGNOSIS — T451X5A Adverse effect of antineoplastic and immunosuppressive drugs, initial encounter: Secondary | ICD-10-CM | POA: Diagnosis not present

## 2019-07-10 DIAGNOSIS — D701 Agranulocytosis secondary to cancer chemotherapy: Secondary | ICD-10-CM | POA: Diagnosis not present

## 2019-07-10 DIAGNOSIS — Z5111 Encounter for antineoplastic chemotherapy: Secondary | ICD-10-CM | POA: Diagnosis not present

## 2019-07-10 DIAGNOSIS — C679 Malignant neoplasm of bladder, unspecified: Secondary | ICD-10-CM | POA: Diagnosis not present

## 2019-07-10 DIAGNOSIS — Z79899 Other long term (current) drug therapy: Secondary | ICD-10-CM

## 2019-07-10 DIAGNOSIS — Z95828 Presence of other vascular implants and grafts: Secondary | ICD-10-CM

## 2019-07-10 DIAGNOSIS — C670 Malignant neoplasm of trigone of bladder: Secondary | ICD-10-CM

## 2019-07-10 LAB — CBC WITH DIFFERENTIAL (CANCER CENTER ONLY)
Abs Immature Granulocytes: 0.02 10*3/uL (ref 0.00–0.07)
Basophils Absolute: 0 10*3/uL (ref 0.0–0.1)
Basophils Relative: 0 %
Eosinophils Absolute: 0.1 10*3/uL (ref 0.0–0.5)
Eosinophils Relative: 2 %
HCT: 33.1 % — ABNORMAL LOW (ref 39.0–52.0)
Hemoglobin: 11.4 g/dL — ABNORMAL LOW (ref 13.0–17.0)
Immature Granulocytes: 1 %
Lymphocytes Relative: 46 %
Lymphs Abs: 1.4 10*3/uL (ref 0.7–4.0)
MCH: 31.2 pg (ref 26.0–34.0)
MCHC: 34.4 g/dL (ref 30.0–36.0)
MCV: 90.7 fL (ref 80.0–100.0)
Monocytes Absolute: 0.6 10*3/uL (ref 0.1–1.0)
Monocytes Relative: 20 %
Neutro Abs: 0.9 10*3/uL — ABNORMAL LOW (ref 1.7–7.7)
Neutrophils Relative %: 31 %
Platelet Count: 252 10*3/uL (ref 150–400)
RBC: 3.65 MIL/uL — ABNORMAL LOW (ref 4.22–5.81)
RDW: 13.6 % (ref 11.5–15.5)
WBC Count: 3 10*3/uL — ABNORMAL LOW (ref 4.0–10.5)
nRBC: 0 % (ref 0.0–0.2)

## 2019-07-10 LAB — CMP (CANCER CENTER ONLY)
ALT: 21 U/L (ref 0–44)
AST: 25 U/L (ref 15–41)
Albumin: 3.6 g/dL (ref 3.5–5.0)
Alkaline Phosphatase: 45 U/L (ref 38–126)
Anion gap: 9 (ref 5–15)
BUN: 15 mg/dL (ref 8–23)
CO2: 23 mmol/L (ref 22–32)
Calcium: 8.8 mg/dL — ABNORMAL LOW (ref 8.9–10.3)
Chloride: 103 mmol/L (ref 98–111)
Creatinine: 1.01 mg/dL (ref 0.61–1.24)
GFR, Est AFR Am: >60 mL/min (ref >60)
GFR, Estimated: >60 mL/min (ref >60)
Glucose, Bld: 166 mg/dL — ABNORMAL HIGH (ref 70–99)
Potassium: 4.4 mmol/L (ref 3.5–5.1)
Sodium: 135 mmol/L (ref 135–145)
Total Bilirubin: 0.3 mg/dL (ref 0.3–1.2)
Total Protein: 6.8 g/dL (ref 6.5–8.1)

## 2019-07-10 MED ORDER — SODIUM CHLORIDE 0.9% FLUSH
10.0000 mL | Freq: Once | INTRAVENOUS | Status: AC
  Administered 2019-07-10: 10 mL
  Filled 2019-07-10: qty 10

## 2019-07-10 MED ORDER — HEPARIN SOD (PORK) LOCK FLUSH 100 UNIT/ML IV SOLN
500.0000 [IU] | Freq: Once | INTRAVENOUS | Status: AC
  Administered 2019-07-10: 500 [IU]
  Filled 2019-07-10: qty 5

## 2019-07-10 NOTE — Progress Notes (Signed)
PT didn't want to stay accessed for infusion tomorrow.

## 2019-07-10 NOTE — Patient Instructions (Signed)

## 2019-07-10 NOTE — Progress Notes (Signed)
Hematology and Oncology Follow Up Visit  Gregory Cummings [id number] 11/16/1946 73 y.o. 07/10/2019 9:54 AM [name], MDStoneking, Christiane Ha, MD   Principle Diagnosis: 73 year old man with bladder cancer diagnosed in April 2020.  He presented with T2N0 high-grade urothelial carcinoma with 30% squamous cell differentiation and 10% glandular component at the time of diagnosis.   Prior Therapy:  TURBT on April 12, 2019 which confirmed the diagnosis.  Current therapy: Neoadjuvant chemotherapy utilizing gemcitabine and cisplatin started on 05/29/2019.  He is here for evaluation prior to cycle 3 of therapy.  Interim History: [name] is here for a follow-up.  Since the last visit, he continues to tolerate chemotherapy without any major complaints.  He denies any nausea, vomiting or excessive fatigue or tiredness.  He continues to attend activities of daily living without any decline.  He denies any worsening neuropathy or infusion related complications.  His performance status and quality of life remains unchanged.   Patient denied headaches, blurry vision, syncope or seizures.  Denies any fevers, chills or sweats.  Denied chest pain, palpitation, orthopnea or leg edema.  Denied cough, wheezing or hemoptysis.  Denied nausea, vomiting or abdominal pain.  Denies any constipation or diarrhea.  Denies any frequency urgency or hesitancy.  Denies any arthralgias or myalgias.  Denies any skin rashes or lesions.  Denies any bleeding or clotting tendency.  Denies any easy bruising.  Denies any hair or nail changes.  Denies any anxiety or depression.  Remaining review of system is negative.          Medications: I have reviewed the patient's current medications.  Current Outpatient Medications  Medication Sig Dispense Refill  . aspirin 81 MG tablet Take 81 mg by mouth daily.    . famotidine (PEPCID) 10 MG tablet Take 10 mg by mouth daily as needed for heartburn or indigestion.    . fenofibrate (TRICOR)  145 MG tablet Take 145 mg by mouth every evening.     . insulin aspart (NOVOLOG) 100 UNIT/ML injection Inject 0-60 Units into the skin 3 (three) times daily.    [name] lidocaine-prilocaine (EMLA) cream Apply 1 application topically as needed. 30 g 0  . lisinopril (PRINIVIL,ZESTRIL) 2.5 MG tablet Take 2.5 mg by mouth daily.    . phenazopyridine (PYRIDIUM) 200 MG tablet Take 1 tablet (200 mg total) by mouth 3 (three) times daily as needed for pain. (Patient not taking: Reported on 05/17/2019) 10 tablet 0  . prochlorperazine (COMPAZINE) 10 MG tablet Take 1 tablet (10 mg total) by mouth every 6 (six) hours as needed for nausea or vomiting. 30 tablet 0  . simvastatin (ZOCOR) 20 MG tablet Take 20 mg by mouth every evening.     . traMADol (ULTRAM) 50 MG tablet Take 1-2 tablets (50-100 mg total) by mouth every 6 (six) hours as needed for moderate pain. (Patient not taking: Reported on 05/17/2019) 15 tablet 0   No current facility-administered medications for this visit.      Allergies: No Known Allergies  Past Medical History, Surgical history, Social history, and Family History were reviewed and updated.  [name]  Physical Exam: Blood pressure (!) 137/93, pulse 72, temperature 98.7 F (37.1 C), temperature source Oral, resp. rate 17, height 5\' 9"  (1.753 m), weight 219 lb 6.4 oz (99.5 kg), SpO2 97 %.   ECOG: 0    General appearance: Alert, awake without any distress. Head: Atraumatic without abnormalities Oropharynx: Without any thrush or ulcers. Eyes: No scleral icterus. Lymph nodes: No lymphadenopathy noted in  the cervical, supraclavicular, or axillary nodes Heart:regular rate and rhythm, without any murmurs or gallops.   Lung: Clear to auscultation without any rhonchi, wheezes or dullness to percussion. Abdomin: Soft, nontender without any shifting dullness or ascites. Musculoskeletal: No clubbing or cyanosis. Neurological: No motor or sensory deficits. Skin: No rashes or lesions. Psychiatric:  Mood and affect appeared normal.     Lab Results: Lab Results  Component Value Date   WBC 3.0 (L) 07/10/2019   HGB 11.4 (L) 07/10/2019   HCT 33.1 (L) 07/10/2019   MCV 90.7 07/10/2019   PLT 252 07/10/2019     Chemistry      Component Value Date/Time   NA 130 (L) 06/27/2019 0827   K 4.5 06/27/2019 0827   CL 97 (L) 06/27/2019 0827   CO2 23 06/27/2019 0827   BUN 21 06/27/2019 0827   CREATININE 1.11 06/27/2019 0827      Component Value Date/Time   CALCIUM 9.2 06/27/2019 0827   ALKPHOS 68 06/27/2019 0827   AST 29 06/27/2019 0827   ALT 26 06/27/2019 0827   BILITOT 0.4 06/27/2019 0827         Impression and Plan:  73 year old man with:  1.    Bladder cancer diagnosed in April 2020.  He was found to have T2N0 high-grade urothelial carcinoma at that time.  He is receiving neoadjuvant chemotherapy without any major complications.  Risks and benefits of continuing this approach was discussed today.  Let us complete 4 cycles of therapy for proceeding with surgical resection.  Potential complications include neutropenia, neutropenic sepsis, myelosuppression, bleeding and infusion related complications.  Is agreeable to continue at this time and will proceed with cycle 3 on 07/11/2019 without any delay.  He understands that day 8 of subsequent cycles may be held because of cytopenias.   2. IV access:Port-A-Cath remains in place without any concerns or issues.  3. Antiemetics: No nausea or vomiting reported at this time.  He is antiemetics are effective at this time.  4. Renal function surveillance:Creatinine remains close to baseline without any adjustments needed.  5. Goals of care:  His disease is incurable and aggressive therapy is warranted at this time.  6.  Neutropenia: Related to chemotherapy.  He will start cycle 3 on time and potentially hold day 8 if neutropenia does not resolve.  We also discussed the role of growth factor support moving forward.  7.  Follow-up: On 07/11/2019 to start cycle 3 of therapy.  Cycle 4 will be tentatively scheduled to start on August 4.   25  minutes was spent with the patient face-to-face today.  More than 50% of time was spent on reviewing his disease status, updating treatment options, complications related to therapy and answering questions regarding future plan of care.   Zola Button, MD 7/14/20209:54 AM

## 2019-07-11 ENCOUNTER — Other Ambulatory Visit: Payer: Self-pay

## 2019-07-11 ENCOUNTER — Inpatient Hospital Stay: Payer: Medicare Other

## 2019-07-11 VITALS — BP 133/82 | HR 70 | Temp 98.2°F | Resp 16

## 2019-07-11 DIAGNOSIS — Z5111 Encounter for antineoplastic chemotherapy: Secondary | ICD-10-CM | POA: Diagnosis not present

## 2019-07-11 DIAGNOSIS — C679 Malignant neoplasm of bladder, unspecified: Secondary | ICD-10-CM

## 2019-07-11 MED ORDER — SODIUM CHLORIDE 0.9 % IV SOLN
Freq: Once | INTRAVENOUS | Status: AC
  Administered 2019-07-11: 12:00:00 via INTRAVENOUS
  Filled 2019-07-11: qty 5

## 2019-07-11 MED ORDER — HEPARIN SOD (PORK) LOCK FLUSH 100 UNIT/ML IV SOLN
500.0000 [IU] | Freq: Once | INTRAVENOUS | Status: AC | PRN
  Administered 2019-07-11: 500 [IU]
  Filled 2019-07-11: qty 5

## 2019-07-11 MED ORDER — PALONOSETRON HCL INJECTION 0.25 MG/5ML
INTRAVENOUS | Status: AC
  Filled 2019-07-11: qty 5

## 2019-07-11 MED ORDER — SODIUM CHLORIDE 0.9% FLUSH
10.0000 mL | INTRAVENOUS | Status: DC | PRN
  Administered 2019-07-11: 10 mL
  Filled 2019-07-11: qty 10

## 2019-07-11 MED ORDER — SODIUM CHLORIDE 0.9 % IV SOLN
1000.0000 mg/m2 | Freq: Once | INTRAVENOUS | Status: AC
  Administered 2019-07-11: 2204 mg via INTRAVENOUS
  Filled 2019-07-11: qty 57.97

## 2019-07-11 MED ORDER — PALONOSETRON HCL INJECTION 0.25 MG/5ML
0.2500 mg | Freq: Once | INTRAVENOUS | Status: AC
  Administered 2019-07-11: 0.25 mg via INTRAVENOUS

## 2019-07-11 MED ORDER — POTASSIUM CHLORIDE 2 MEQ/ML IV SOLN
Freq: Once | INTRAVENOUS | Status: AC
  Administered 2019-07-11: 10:00:00 via INTRAVENOUS
  Filled 2019-07-11: qty 10

## 2019-07-11 MED ORDER — SODIUM CHLORIDE 0.9 % IV SOLN
Freq: Once | INTRAVENOUS | Status: AC
  Administered 2019-07-11: 09:00:00 via INTRAVENOUS
  Filled 2019-07-11: qty 250

## 2019-07-11 MED ORDER — SODIUM CHLORIDE 0.9 % IV SOLN
70.0000 mg/m2 | Freq: Once | INTRAVENOUS | Status: AC
  Administered 2019-07-11: 154 mg via INTRAVENOUS
  Filled 2019-07-11: qty 154

## 2019-07-11 NOTE — Progress Notes (Signed)
Dr. Alen Blew aware of [place] of 0.9, saw patient yesterday in office and wanting to proceed with treatment today.

## 2019-07-11 NOTE — Patient Instructions (Signed)
[facility]cer Center Discharge Instructions for Patients Receiving Chemotherapy  Today you received the following chemotherapy agents: Gemzar, cisplatin   To help prevent nausea and vomiting after your treatment, we encourage you to take your nausea medication as directed.    If you develop nausea and vomiting that is not controlled by your nausea medication, call the clinic.   BELOW ARE SYMPTOMS THAT SHOULD BE REPORTED IMMEDIATELY:  *FEVER GREATER THAN 100.5 F  *CHILLS WITH OR WITHOUT FEVER  NAUSEA AND VOMITING THAT IS NOT CONTROLLED WITH YOUR NAUSEA MEDICATION  *UNUSUAL SHORTNESS OF BREATH  *UNUSUAL BRUISING OR BLEEDING  TENDERNESS IN MOUTH AND THROAT WITH OR WITHOUT PRESENCE OF ULCERS  *URINARY PROBLEMS  *BOWEL PROBLEMS  UNUSUAL RASH Items with * indicate a potential emergency and should be followed up as soon as possible.  Feel free to call the clinic should you have any questions or concerns. The clinic phone number is [phone].  Please show the CHEMO ALERT CARD at check-in to the Emergency Department and triage nurse.   

## 2019-07-18 ENCOUNTER — Inpatient Hospital Stay: Payer: Medicare Other

## 2019-07-18 ENCOUNTER — Other Ambulatory Visit: Payer: Self-pay

## 2019-07-18 VITALS — BP 147/85 | HR 61 | Temp 97.8°F | Resp 18

## 2019-07-18 DIAGNOSIS — Z5111 Encounter for antineoplastic chemotherapy: Secondary | ICD-10-CM | POA: Diagnosis not present

## 2019-07-18 DIAGNOSIS — Z95828 Presence of other vascular implants and grafts: Secondary | ICD-10-CM

## 2019-07-18 DIAGNOSIS — C679 Malignant neoplasm of bladder, unspecified: Secondary | ICD-10-CM

## 2019-07-18 DIAGNOSIS — C670 Malignant neoplasm of trigone of bladder: Secondary | ICD-10-CM

## 2019-07-18 LAB — CMP (CANCER CENTER ONLY)
ALT: 20 U/L (ref 0–44)
AST: 21 U/L (ref 15–41)
Albumin: 3.4 g/dL — ABNORMAL LOW (ref 3.5–5.0)
Alkaline Phosphatase: 58 U/L (ref 38–126)
Anion gap: 8 (ref 5–15)
BUN: 17 mg/dL (ref 8–23)
CO2: 24 mmol/L (ref 22–32)
Calcium: 9 mg/dL (ref 8.9–10.3)
Chloride: 97 mmol/L — ABNORMAL LOW (ref 98–111)
Creatinine: 1.16 mg/dL (ref 0.61–1.24)
GFR, Est AFR Am: >60 mL/min (ref >60)
GFR, Estimated: >60 mL/min (ref >60)
Glucose, Bld: 264 mg/dL — ABNORMAL HIGH (ref 70–99)
Potassium: 4.7 mmol/L (ref 3.5–5.1)
Sodium: 129 mmol/L — ABNORMAL LOW (ref 135–145)
Total Bilirubin: 0.3 mg/dL (ref 0.3–1.2)
Total Protein: 6.5 g/dL (ref 6.5–8.1)

## 2019-07-18 LAB — CBC WITH DIFFERENTIAL (CANCER CENTER ONLY)
Abs Immature Granulocytes: 0.01 10*3/uL (ref 0.00–0.07)
Basophils Absolute: 0 10*3/uL (ref 0.0–0.1)
Basophils Relative: 2 %
Eosinophils Absolute: 0 10*3/uL (ref 0.0–0.5)
Eosinophils Relative: 0 %
HCT: 31.2 % — ABNORMAL LOW (ref 39.0–52.0)
Hemoglobin: 10.8 g/dL — ABNORMAL LOW (ref 13.0–17.0)
Immature Granulocytes: 0 %
Lymphocytes Relative: 61 %
Lymphs Abs: 1.5 10*3/uL (ref 0.7–4.0)
MCH: 31.7 pg (ref 26.0–34.0)
MCHC: 34.6 g/dL (ref 30.0–36.0)
MCV: 91.5 fL (ref 80.0–100.0)
Monocytes Absolute: 0.1 10*3/uL (ref 0.1–1.0)
Monocytes Relative: 3 %
Neutro Abs: 0.8 10*3/uL — ABNORMAL LOW (ref 1.7–7.7)
Neutrophils Relative %: 34 %
Platelet Count: 290 10*3/uL (ref 150–400)
RBC: 3.41 MIL/uL — ABNORMAL LOW (ref 4.22–5.81)
RDW: 13.8 % (ref 11.5–15.5)
WBC Count: 2.4 10*3/uL — ABNORMAL LOW (ref 4.0–10.5)
nRBC: 0 % (ref 0.0–0.2)

## 2019-07-18 LAB — MAGNESIUM: Magnesium: 1.6 mg/dL — ABNORMAL LOW (ref 1.7–2.4)

## 2019-07-18 MED ORDER — HEPARIN SOD (PORK) LOCK FLUSH 100 UNIT/ML IV SOLN
500.0000 [IU] | Freq: Once | INTRAVENOUS | Status: AC
  Administered 2019-07-18: 500 [IU] via INTRAVENOUS
  Filled 2019-07-18: qty 5

## 2019-07-18 MED ORDER — SODIUM CHLORIDE 0.9% FLUSH
10.0000 mL | Freq: Once | INTRAVENOUS | Status: AC
  Administered 2019-07-18: 10 mL via INTRAVENOUS
  Filled 2019-07-18: qty 10

## 2019-07-18 MED ORDER — SODIUM CHLORIDE 0.9% FLUSH
10.0000 mL | Freq: Once | INTRAVENOUS | Status: AC
  Administered 2019-07-18: 10 mL
  Filled 2019-07-18: qty 10

## 2019-07-18 NOTE — Progress Notes (Signed)
Patient's ANC today is 0.8. Per Dr. Alen Blew: "Please hold treatment today. No rescheduling for now." Patient informed and he verbalized understanding. Printed copy of Lab work provided. Patient knows to call with any complain or concerns.

## 2019-07-30 ENCOUNTER — Other Ambulatory Visit: Payer: Self-pay

## 2019-07-30 ENCOUNTER — Inpatient Hospital Stay: Payer: Medicare Other | Attending: Oncology | Admitting: Oncology

## 2019-07-30 ENCOUNTER — Inpatient Hospital Stay: Payer: Medicare Other

## 2019-07-30 ENCOUNTER — Telehealth: Payer: Self-pay | Admitting: Oncology

## 2019-07-30 VITALS — BP 141/84 | HR 64 | Temp 98.0°F | Resp 17 | Wt 219.6 lb

## 2019-07-30 DIAGNOSIS — E119 Type 2 diabetes mellitus without complications: Secondary | ICD-10-CM | POA: Insufficient documentation

## 2019-07-30 DIAGNOSIS — D701 Agranulocytosis secondary to cancer chemotherapy: Secondary | ICD-10-CM | POA: Diagnosis not present

## 2019-07-30 DIAGNOSIS — C670 Malignant neoplasm of trigone of bladder: Secondary | ICD-10-CM | POA: Insufficient documentation

## 2019-07-30 DIAGNOSIS — Z95828 Presence of other vascular implants and grafts: Secondary | ICD-10-CM

## 2019-07-30 DIAGNOSIS — Z7982 Long term (current) use of aspirin: Secondary | ICD-10-CM | POA: Insufficient documentation

## 2019-07-30 DIAGNOSIS — Z79899 Other long term (current) drug therapy: Secondary | ICD-10-CM | POA: Insufficient documentation

## 2019-07-30 DIAGNOSIS — K219 Gastro-esophageal reflux disease without esophagitis: Secondary | ICD-10-CM | POA: Insufficient documentation

## 2019-07-30 DIAGNOSIS — Z5111 Encounter for antineoplastic chemotherapy: Secondary | ICD-10-CM | POA: Diagnosis not present

## 2019-07-30 DIAGNOSIS — E785 Hyperlipidemia, unspecified: Secondary | ICD-10-CM | POA: Insufficient documentation

## 2019-07-30 DIAGNOSIS — C679 Malignant neoplasm of bladder, unspecified: Secondary | ICD-10-CM

## 2019-07-30 DIAGNOSIS — Z794 Long term (current) use of insulin: Secondary | ICD-10-CM | POA: Diagnosis not present

## 2019-07-30 LAB — CBC WITH DIFFERENTIAL (CANCER CENTER ONLY)
Abs Immature Granulocytes: 0.02 10*3/uL (ref 0.00–0.07)
Basophils Absolute: 0 10*3/uL (ref 0.0–0.1)
Basophils Relative: 1 %
Eosinophils Absolute: 0.1 10*3/uL (ref 0.0–0.5)
Eosinophils Relative: 2 %
HCT: 33.7 % — ABNORMAL LOW (ref 39.0–52.0)
Hemoglobin: 11.5 g/dL — ABNORMAL LOW (ref 13.0–17.0)
Immature Granulocytes: 1 %
Lymphocytes Relative: 33 %
Lymphs Abs: 1.1 10*3/uL (ref 0.7–4.0)
MCH: 32.6 pg (ref 26.0–34.0)
MCHC: 34.1 g/dL (ref 30.0–36.0)
MCV: 95.5 fL (ref 80.0–100.0)
Monocytes Absolute: 0.8 10*3/uL (ref 0.1–1.0)
Monocytes Relative: 23 %
Neutro Abs: 1.4 10*3/uL — ABNORMAL LOW (ref 1.7–7.7)
Neutrophils Relative %: 40 %
Platelet Count: 231 10*3/uL (ref 150–400)
RBC: 3.53 MIL/uL — ABNORMAL LOW (ref 4.22–5.81)
RDW: 17.2 % — ABNORMAL HIGH (ref 11.5–15.5)
WBC Count: 3.3 10*3/uL — ABNORMAL LOW (ref 4.0–10.5)
nRBC: 0 % (ref 0.0–0.2)

## 2019-07-30 LAB — CMP (CANCER CENTER ONLY)
ALT: 20 U/L (ref 0–44)
AST: 21 U/L (ref 15–41)
Albumin: 3.6 g/dL (ref 3.5–5.0)
Alkaline Phosphatase: 57 U/L (ref 38–126)
Anion gap: 6 (ref 5–15)
BUN: 20 mg/dL (ref 8–23)
CO2: 25 mmol/L (ref 22–32)
Calcium: 9.1 mg/dL (ref 8.9–10.3)
Chloride: 104 mmol/L (ref 98–111)
Creatinine: 1.01 mg/dL (ref 0.61–1.24)
GFR, Est AFR Am: >60 mL/min (ref >60)
GFR, Estimated: >60 mL/min (ref >60)
Glucose, Bld: 183 mg/dL — ABNORMAL HIGH (ref 70–99)
Potassium: 4.4 mmol/L (ref 3.5–5.1)
Sodium: 135 mmol/L (ref 135–145)
Total Bilirubin: 0.3 mg/dL (ref 0.3–1.2)
Total Protein: 6.6 g/dL (ref 6.5–8.1)

## 2019-07-30 MED ORDER — SODIUM CHLORIDE 0.9% FLUSH
10.0000 mL | Freq: Once | INTRAVENOUS | Status: AC
  Administered 2019-07-30: 10 mL
  Filled 2019-07-30: qty 10

## 2019-07-30 MED ORDER — HEPARIN SOD (PORK) LOCK FLUSH 100 UNIT/ML IV SOLN
500.0000 [IU] | Freq: Once | INTRAVENOUS | Status: AC
  Administered 2019-07-30: 500 [IU]
  Filled 2019-07-30: qty 5

## 2019-07-30 NOTE — Progress Notes (Signed)
Hematology and Oncology Follow Up Visit  Gregory Cummings [id number] 1946/10/09 73 y.o. 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  He was also found to have 30% squamous cell differentiation and 10% glandular component.    Prior Therapy:  TURBT on April 12, 2019 which confirmed the diagnosis.  Current therapy: Neoadjuvant chemotherapy utilizing gemcitabine and cisplatin started on 05/29/2019.  He is status post 3 cycles of therapy and here for evaluation prior to cycle 4.  Interim History: [name] presents today for a repeat evaluation.  Since the last visit, he completed 3 cycles of chemotherapy without any major complaints.  He denies any nausea, vomiting or neuropathy.  He denies any worsening fatigue or tiredness.  He continues to tolerate chemotherapy without any plaints.  He remains active and attends activities of daily living.  He denies any complications related to his Port-A-Cath.  Het denied any alteration mental status, neuropathy, confusion or dizziness.  Denies any headaches or lethargy.  Denies any night sweats, weight loss or changes in appetite.  Denied orthopnea, dyspnea on exertion or chest discomfort.  Denies shortness of breath, difficulty breathing hemoptysis or cough.  Denies any abdominal distention, nausea, early satiety or dyspepsia.  Denies any hematuria, frequency, dysuria or nocturia.  Denies any skin irritation, dryness or rash.  Denies any ecchymosis or petechiae.  Denies any lymphadenopathy or clotting.  Denies any heat or cold intolerance.  Denies any anxiety or depression.  Remaining review of system is negative.               Medications: Reviewed today without any changes. Current Outpatient Medications  Medication Sig Dispense Refill  . aspirin 81 MG tablet Take 81 mg by mouth daily.    . famotidine (PEPCID) 10 MG  tablet Take 10 mg by mouth daily as needed for heartburn or indigestion.    . fenofibrate (TRICOR) 145 MG tablet Take 145 mg by mouth every evening.     . insulin aspart (NOVOLOG) 100 UNIT/ML injection Inject 0-60 Units into the skin 3 (three) times daily.    [name] lidocaine-prilocaine (EMLA) cream Apply 1 application topically as needed. 30 g 0  . lisinopril (PRINIVIL,ZESTRIL) 2.5 MG tablet Take 2.5 mg by mouth daily.    . phenazopyridine (PYRIDIUM) 200 MG tablet Take 1 tablet (200 mg total) by mouth 3 (three) times daily as needed for pain. (Patient not taking: Reported on 05/17/2019) 10 tablet 0  . prochlorperazine (COMPAZINE) 10 MG tablet Take 1 tablet (10 mg total) by mouth every 6 (six) hours as needed for nausea or vomiting. 30 tablet 0  . simvastatin (ZOCOR) 20 MG tablet Take 20 mg by mouth every evening.     . traMADol (ULTRAM) 50 MG tablet Take 1-2 tablets (50-100 mg total) by mouth every 6 (six) hours as needed for moderate pain. (Patient not taking: Reported on 05/17/2019) 15 tablet 0   No current facility-administered medications for this visit.      Allergies: No Known Allergies  Past Medical History, Surgical history, Social history, and Family History reviewed without any changes.  .  Physical Exam: Blood pressure (!) 141/84, pulse 64, temperature 98 F (36.7 C), temperature source Temporal, resp. rate 17, weight 219 lb 9 oz (99.6 kg), SpO2 100 %.   ECOG: 0   General appearance: Comfortable appearing without any discomfort Head: Normocephalic without any trauma Oropharynx: Mucous membranes are  moist and pink without any thrush or ulcers. Eyes: Pupils are equal and round reactive to light. Lymph nodes: No cervical, supraclavicular, inguinal or axillary lymphadenopathy.   Heart:regular rate and rhythm.  S1 and S2 without leg edema. Chest wall examination showed Port-A-Cath site without any erythema or induration. Lung: Clear without any rhonchi or wheezes.  No dullness to  percussion. Abdomin: Soft, nontender, nondistended with good bowel sounds.  No hepatosplenomegaly. Musculoskeletal: No joint deformity or effusion.  Full range of motion noted. Neurological: No deficits noted on motor, sensory and deep tendon reflex exam. Skin: No petechial rash or dryness.  Appeared moist.       Lab Results: Lab Results  Component Value Date   WBC 2.4 (L) 07/18/2019   HGB 10.8 (L) 07/18/2019   HCT 31.2 (L) 07/18/2019   MCV 91.5 07/18/2019   PLT 290 07/18/2019     Chemistry      Component Value Date/Time   NA 129 (L) 07/18/2019 0837   K 4.7 07/18/2019 0837   CL 97 (L) 07/18/2019 0837   CO2 24 07/18/2019 0837   BUN 17 07/18/2019 0837   CREATININE 1.16 07/18/2019 0837      Component Value Date/Time   CALCIUM 9.0 07/18/2019 0837   ALKPHOS 58 07/18/2019 0837   AST 21 07/18/2019 0837   ALT 20 07/18/2019 0837   BILITOT 0.3 07/18/2019 0837         Impression and Plan:  73 year old man with:  1.  T2N0 high-grade urothelial carcinoma of the  bladder diagnosed in April 2020.    He continues to tolerate neoadjuvant chemotherapy without any major complications.  Risks and benefits of proceeding with cycle 4 of therapy was reviewed.  Potential complications including neutropenia, sepsis, bleeding and renal insufficiency.  Electrolyte imbalance is also a consideration.  After discussion is agreeable to proceed with cycle 4 of chemotherapy and then he will have repeat staging work-up.  If his CT scan did not show any evidence of disease outside of the bladder, radical cystectomy will be his next step.  Additional salvage therapies with immunotherapy and different chemotherapy will be only needed if he develops relapsed disease.  We will await his laboratory testing from today and schedule imaging studies in September complete the staging work-up prior to surgery.  Related to chemotherapy may be needed if his counts are not adequate especially due to  neutropenia.  2. IV access:Port-A-Cath remains in place without any issues.  3. Antiemetics: Antiemetics are available to him no nausea or vomiting reported.  4. Renal function surveillance:His kidney function remains stable without any issues related to cisplatin.  5. Goals of care:Therapy remains curative at this time and aggressive measures are warranted.  6.  Neutropenia: Related to chemotherapy and will be effective periodically before each cycle of chemotherapy.  7. Follow-up: Will be on 07/31/2019 to start cycle 4 of therapy if his counts are adequate.  He will receive day 8 on August 11 tentatively.  CT scan will be scheduled for September MD follow-up at that time.   25  minutes was spent with the patient face-to-face today.  More than 50% of time was dedicated to reviewing laboratory data, complications related to chemotherapy, future plan of care and answering questions regarding prognosis long-term options of treatment.   Zola Button, MD 8/3/20209:12 AM

## 2019-07-30 NOTE — Telephone Encounter (Signed)
Called and spoke with patients healthcare agent. Confirmed dates and times of appt

## 2019-07-31 ENCOUNTER — Inpatient Hospital Stay: Payer: Medicare Other

## 2019-07-31 ENCOUNTER — Other Ambulatory Visit: Payer: Self-pay

## 2019-07-31 ENCOUNTER — Other Ambulatory Visit: Payer: Self-pay | Admitting: Oncology

## 2019-07-31 VITALS — BP 121/80 | HR 70 | Temp 98.7°F | Resp 18

## 2019-07-31 DIAGNOSIS — Z5111 Encounter for antineoplastic chemotherapy: Secondary | ICD-10-CM | POA: Diagnosis not present

## 2019-07-31 DIAGNOSIS — C679 Malignant neoplasm of bladder, unspecified: Secondary | ICD-10-CM

## 2019-07-31 MED ORDER — SODIUM CHLORIDE 0.9% FLUSH
10.0000 mL | INTRAVENOUS | Status: DC | PRN
  Administered 2019-07-31: 10 mL
  Filled 2019-07-31: qty 10

## 2019-07-31 MED ORDER — SODIUM CHLORIDE 0.9 % IV SOLN
Freq: Once | INTRAVENOUS | Status: AC
  Administered 2019-07-31: 12:00:00 via INTRAVENOUS
  Filled 2019-07-31: qty 5

## 2019-07-31 MED ORDER — SODIUM CHLORIDE 0.9 % IV SOLN
70.0000 mg/m2 | Freq: Once | INTRAVENOUS | Status: AC
  Administered 2019-07-31: 154 mg via INTRAVENOUS
  Filled 2019-07-31: qty 154

## 2019-07-31 MED ORDER — SODIUM CHLORIDE 0.9 % IV SOLN
1000.0000 mg/m2 | Freq: Once | INTRAVENOUS | Status: AC
  Administered 2019-07-31: 2204 mg via INTRAVENOUS
  Filled 2019-07-31: qty 57.97

## 2019-07-31 MED ORDER — HEPARIN SOD (PORK) LOCK FLUSH 100 UNIT/ML IV SOLN
500.0000 [IU] | Freq: Once | INTRAVENOUS | Status: AC | PRN
  Administered 2019-07-31: 500 [IU]
  Filled 2019-07-31: qty 5

## 2019-07-31 MED ORDER — PALONOSETRON HCL INJECTION 0.25 MG/5ML
INTRAVENOUS | Status: AC
  Filled 2019-07-31: qty 5

## 2019-07-31 MED ORDER — PALONOSETRON HCL INJECTION 0.25 MG/5ML
0.2500 mg | Freq: Once | INTRAVENOUS | Status: AC
  Administered 2019-07-31: 0.25 mg via INTRAVENOUS

## 2019-07-31 MED ORDER — SODIUM CHLORIDE 0.9 % IV SOLN
Freq: Once | INTRAVENOUS | Status: AC
  Administered 2019-07-31: 09:00:00 via INTRAVENOUS
  Filled 2019-07-31: qty 250

## 2019-07-31 MED ORDER — POTASSIUM CHLORIDE 2 MEQ/ML IV SOLN
Freq: Once | INTRAVENOUS | Status: AC
  Administered 2019-07-31: 10:00:00 via INTRAVENOUS
  Filled 2019-07-31: qty 10

## 2019-07-31 NOTE — Progress Notes (Signed)
Ok to treat with [place] from 07/30/2019 per MD [facility]

## 2019-07-31 NOTE — Progress Notes (Signed)
07/18/19 Mg = 1.6 Cont as planned w/ IVF today w/o change. MD plans to recheck Mg next week.  Kennith Center, Pharm.D., CPP 07/31/2019@9 :22 AM

## 2019-07-31 NOTE — Patient Instructions (Signed)
Coronavirus (COVID-19) Are you at risk?  Are you at risk for the Coronavirus (COVID-19)?  To be considered HIGH RISK for Coronavirus (COVID-19), you have to meet the following criteria:  . Traveled to [place], [place], [place], [place] or [place]; or in the [place] to [place], [place], [place], or [place]; and have fever, cough, and shortness of breath within the last 2 weeks of travel OR . Been in close contact with a person diagnosed with COVID-19 within the last 2 weeks and have fever, cough, and shortness of breath . IF YOU DO NOT MEET THESE CRITERIA, YOU ARE CONSIDERED LOW RISK FOR COVID-19.  What to do if you are HIGH RISK for COVID-19?  . If you are having a medical emergency, call 911. . Seek medical care right away. Before you go to a doctor's office, urgent care or emergency department, call ahead and tell them about your recent travel, contact with someone diagnosed with COVID-19, and your symptoms. You should receive instructions from your physician's office regarding next steps of care.  . When you arrive at healthcare provider, tell the healthcare staff immediately you have returned from visiting [place], [place], [place], [place] or [place]; or traveled in the [place] to [place], [place], [place], or [place]; in the last two weeks or you have been in close contact with a person diagnosed with COVID-19 in the last 2 weeks.   . Tell the health care staff about your symptoms: fever, cough and shortness of breath. . After you have been seen by a medical provider, you will be either: o Tested for (COVID-19) and discharged home on quarantine except to seek medical care if symptoms worsen, and asked to  - Stay home and avoid contact with others until you get your results (4-5 days)  - Avoid travel on public transportation if possible (such as bus, train, or airplane) or o Sent to the Emergency Department by EMS for evaluation, COVID-19 testing, and possible  admission depending on your condition and test results.  What to do if you are LOW RISK for COVID-19?  Reduce your risk of any infection by using the same precautions used for avoiding the common cold or flu:  . Wash your hands often with soap and warm water for at least 20 seconds.  If soap and water are not readily available, use an alcohol-based hand sanitizer with at least 60% alcohol.  . If coughing or sneezing, cover your mouth and nose by coughing or sneezing into the elbow areas of your shirt or coat, into a tissue or into your sleeve (not your hands). . Avoid shaking hands with others and consider head nods or verbal greetings only. . Avoid touching your eyes, nose, or mouth with unwashed hands.  . Avoid close contact with people who are sick. . Avoid places or events with large numbers of people in one location, like concerts or sporting events. . Carefully consider travel plans you have or are making. . If you are planning any travel outside or inside the [place], visit the CDC's Travelers' Health webpage for the latest health notices. . If you have some symptoms but not all symptoms, continue to monitor at home and seek medical attention if your symptoms worsen. . If you are having a medical emergency, call 911.   ADDITIONAL HEALTHCARE OPTIONS FOR PATIENTS  [facility] / e-Visit: [email or web address]         [facility]: [phone]  Onset   Urgent Care: [facility]: [place] Discharge Instructions for Patients Receiving Chemotherapy  Today you received the following chemotherapy agents Cisplatin and Gemzar  To help prevent nausea and vomiting after your treatment, we encourage you to take your nausea medication as directed.    If you develop nausea and vomiting that is not controlled by your nausea medication, call the clinic.    BELOW ARE SYMPTOMS THAT SHOULD BE REPORTED IMMEDIATELY:  *FEVER GREATER THAN 100.5 F  *CHILLS WITH OR WITHOUT FEVER  NAUSEA AND VOMITING THAT IS NOT CONTROLLED WITH YOUR NAUSEA MEDICATION  *UNUSUAL SHORTNESS OF BREATH  *UNUSUAL BRUISING OR BLEEDING  TENDERNESS IN MOUTH AND THROAT WITH OR WITHOUT PRESENCE OF ULCERS  *URINARY PROBLEMS  *BOWEL PROBLEMS  UNUSUAL RASH Items with * indicate a potential emergency and should be followed up as soon as possible.  Feel free to call the clinic should you have any questions or concerns. The clinic phone number is [phone].  Please show the [place] at check-in to the Emergency Department and triage nurse.

## 2019-08-07 ENCOUNTER — Inpatient Hospital Stay: Payer: Medicare Other

## 2019-08-07 ENCOUNTER — Telehealth: Payer: Self-pay

## 2019-08-07 NOTE — Telephone Encounter (Signed)
Received call from patient cancelling today's appt's due to not feeling well and needing more time to recover. Appointments cancelled and MD made aware.

## 2019-08-07 NOTE — Telephone Encounter (Signed)
-----   Message from Wyatt Portela, MD sent at 08/07/2019  8:20 AM EDT ----- Regarding: RE: cancelling appts today That is fine. Thanks.  ----- Message ----- From: Scot Dock, RN Sent: 08/07/2019   8:06 AM EDT To: Wyatt Portela, MD Subject: cancelling appts today                         He called to cancel appts for today - looks like last scheduled inf appt. He states that he just needs more time to "bounce" back from the last treatment. Lethargy, nausea, no appetite. Next appt scheduled 9/1 lab/flush/scan, 9/3 MD.

## 2019-08-08 ENCOUNTER — Other Ambulatory Visit: Payer: Self-pay | Admitting: Urology

## 2019-08-09 ENCOUNTER — Other Ambulatory Visit: Payer: Self-pay | Admitting: Urology

## 2019-08-28 ENCOUNTER — Inpatient Hospital Stay: Payer: Medicare Other | Attending: Oncology

## 2019-08-28 ENCOUNTER — Ambulatory Visit (HOSPITAL_COMMUNITY)
Admission: RE | Admit: 2019-08-28 | Discharge: 2019-08-28 | Disposition: A | Discharge status: Home / Self Care | Payer: Medicare Other | Source: Ambulatory Visit | Attending: Oncology | Admitting: Oncology

## 2019-08-28 ENCOUNTER — Other Ambulatory Visit: Payer: Self-pay

## 2019-08-28 ENCOUNTER — Encounter (HOSPITAL_COMMUNITY): Payer: Self-pay

## 2019-08-28 ENCOUNTER — Inpatient Hospital Stay: Payer: Medicare Other

## 2019-08-28 DIAGNOSIS — Z79899 Other long term (current) drug therapy: Secondary | ICD-10-CM | POA: Insufficient documentation

## 2019-08-28 DIAGNOSIS — E119 Type 2 diabetes mellitus without complications: Secondary | ICD-10-CM | POA: Diagnosis not present

## 2019-08-28 DIAGNOSIS — E785 Hyperlipidemia, unspecified: Secondary | ICD-10-CM | POA: Diagnosis not present

## 2019-08-28 DIAGNOSIS — Z794 Long term (current) use of insulin: Secondary | ICD-10-CM | POA: Insufficient documentation

## 2019-08-28 DIAGNOSIS — Z95828 Presence of other vascular implants and grafts: Secondary | ICD-10-CM

## 2019-08-28 DIAGNOSIS — C679 Malignant neoplasm of bladder, unspecified: Secondary | ICD-10-CM

## 2019-08-28 DIAGNOSIS — C670 Malignant neoplasm of trigone of bladder: Secondary | ICD-10-CM | POA: Insufficient documentation

## 2019-08-28 DIAGNOSIS — Z9221 Personal history of antineoplastic chemotherapy: Secondary | ICD-10-CM | POA: Insufficient documentation

## 2019-08-28 DIAGNOSIS — Z7982 Long term (current) use of aspirin: Secondary | ICD-10-CM | POA: Insufficient documentation

## 2019-08-28 DIAGNOSIS — R5383 Other fatigue: Secondary | ICD-10-CM | POA: Diagnosis not present

## 2019-08-28 DIAGNOSIS — Z452 Encounter for adjustment and management of vascular access device: Secondary | ICD-10-CM | POA: Insufficient documentation

## 2019-08-28 LAB — CMP (CANCER CENTER ONLY)
ALT: 18 U/L (ref 0–44)
AST: 22 U/L (ref 15–41)
Albumin: 3.8 g/dL (ref 3.5–5.0)
Alkaline Phosphatase: 49 U/L (ref 38–126)
Anion gap: 9 (ref 5–15)
BUN: 20 mg/dL (ref 8–23)
CO2: 25 mmol/L (ref 22–32)
Calcium: 9 mg/dL (ref 8.9–10.3)
Chloride: 101 mmol/L (ref 98–111)
Creatinine: 1.18 mg/dL (ref 0.61–1.24)
GFR, Est AFR Am: >60 mL/min (ref >60)
GFR, Estimated: >60 mL/min (ref >60)
Glucose, Bld: 188 mg/dL — ABNORMAL HIGH (ref 70–99)
Potassium: 4.3 mmol/L (ref 3.5–5.1)
Sodium: 135 mmol/L (ref 135–145)
Total Bilirubin: 0.5 mg/dL (ref 0.3–1.2)
Total Protein: 6.8 g/dL (ref 6.5–8.1)

## 2019-08-28 LAB — CBC WITH DIFFERENTIAL (CANCER CENTER ONLY)
Abs Immature Granulocytes: 0.02 10*3/uL (ref 0.00–0.07)
Basophils Absolute: 0 10*3/uL (ref 0.0–0.1)
Basophils Relative: 1 %
Eosinophils Absolute: 0.1 10*3/uL (ref 0.0–0.5)
Eosinophils Relative: 2 %
HCT: 33.6 % — ABNORMAL LOW (ref 39.0–52.0)
Hemoglobin: 11.5 g/dL — ABNORMAL LOW (ref 13.0–17.0)
Immature Granulocytes: 1 %
Lymphocytes Relative: 30 %
Lymphs Abs: 1.2 10*3/uL (ref 0.7–4.0)
MCH: 33 pg (ref 26.0–34.0)
MCHC: 34.2 g/dL (ref 30.0–36.0)
MCV: 96.6 fL (ref 80.0–100.0)
Monocytes Absolute: 0.5 10*3/uL (ref 0.1–1.0)
Monocytes Relative: 14 %
Neutro Abs: 2.1 10*3/uL (ref 1.7–7.7)
Neutrophils Relative %: 52 %
Platelet Count: 224 10*3/uL (ref 150–400)
RBC: 3.48 MIL/uL — ABNORMAL LOW (ref 4.22–5.81)
RDW: 17.8 % — ABNORMAL HIGH (ref 11.5–15.5)
WBC Count: 4 10*3/uL (ref 4.0–10.5)
nRBC: 0 % (ref 0.0–0.2)

## 2019-08-28 LAB — MAGNESIUM: Magnesium: 1.7 mg/dL (ref 1.7–2.4)

## 2019-08-28 MED ORDER — HEPARIN SOD (PORK) LOCK FLUSH 100 UNIT/ML IV SOLN
500.0000 [IU] | Freq: Once | INTRAVENOUS | Status: AC
  Administered 2019-08-28: 500 [IU] via INTRAVENOUS

## 2019-08-28 MED ORDER — IOHEXOL 300 MG/ML  SOLN
100.0000 mL | Freq: Once | INTRAMUSCULAR | Status: AC | PRN
  Administered 2019-08-28: 12:00:00 100 mL via INTRAVENOUS

## 2019-08-28 MED ORDER — IOHEXOL 300 MG/ML  SOLN
30.0000 mL | Freq: Once | INTRAMUSCULAR | Status: AC | PRN
  Administered 2019-08-28: 30 mL via ORAL

## 2019-08-28 MED ORDER — SODIUM CHLORIDE 0.9 % IV SOLN
INTRAVENOUS | Status: AC
  Filled 2019-08-28: qty 250

## 2019-08-28 MED ORDER — SODIUM CHLORIDE (PF) 0.9 % IJ SOLN
INTRAMUSCULAR | Status: AC
  Filled 2019-08-28: qty 50

## 2019-08-28 MED ORDER — SODIUM CHLORIDE 0.9% FLUSH
10.0000 mL | Freq: Once | INTRAVENOUS | Status: AC
  Administered 2019-08-28: 09:00:00 10 mL
  Filled 2019-08-28: qty 10

## 2019-08-28 MED ORDER — HEPARIN SOD (PORK) LOCK FLUSH 100 UNIT/ML IV SOLN
INTRAVENOUS | Status: AC
  Filled 2019-08-28: qty 5

## 2019-08-30 ENCOUNTER — Inpatient Hospital Stay (HOSPITAL_BASED_OUTPATIENT_CLINIC_OR_DEPARTMENT_OTHER): Payer: Medicare Other | Admitting: Oncology

## 2019-08-30 ENCOUNTER — Other Ambulatory Visit: Payer: Self-pay

## 2019-08-30 VITALS — BP 128/60 | HR 78 | Temp 98.7°F | Resp 18 | Ht 69.0 in | Wt 219.7 lb

## 2019-08-30 DIAGNOSIS — C670 Malignant neoplasm of trigone of bladder: Secondary | ICD-10-CM

## 2019-08-30 NOTE — Progress Notes (Signed)
Hematology and Oncology Follow Up Visit  Gregory Cummings [id number] [birth year] 73 y.o. 08/30/2019 8:57 AM Gregory Cummings, MDStoneking, Christiane Ha, MD   Principle Diagnosis: 73 year old man with bladder cancer diagnosed in April 2020.  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  He is no evidence of metastatic disease.   Prior Therapy:  TURBT on April 12, 2019 which confirmed the diagnosis.  Neoadjuvant chemotherapy utilizing gemcitabine and cisplatin started on 05/29/2019.  He is status post 4 cycles of chemotherapy completed July 31, 2019.  Current therapy: Anterior investigation for definitive radical cystectomy.  Interim History: Gregory Cummings is here for a follow-up.  Since the last visit, he completed 4 cycles of chemotherapy without any major complications.  Day 8 of cycle 4 was withheld because of neutropenia otherwise no other complaints.  He does report some fatigue tiredness that has improved.  His appetite is also improved and weight is stable.  He denies any worsening neuropathy or changes in his mentation.  He denies any urination difficulties.  He denied headaches, blurry vision, syncope or seizures.  Denies any fevers, chills or sweats.  Denied chest pain, palpitation, orthopnea or leg edema.  Denied cough, wheezing or hemoptysis.  Denied nausea, vomiting or abdominal pain.  Denies any constipation or diarrhea.  Denies any frequency urgency or hesitancy.  Denies any arthralgias or myalgias.  Denies any skin rashes or lesions.  Denies any bleeding or clotting tendency.  Denies any easy bruising.  Denies any hair or nail changes.  Denies any anxiety or depression.  Remaining review of system is negative.                 Medications: Updated on review. Current Outpatient Medications  Medication Sig Dispense Refill  . aspirin 81 MG tablet Take 81 mg by mouth daily.    . famotidine (PEPCID) 10 MG  tablet Take 10 mg by mouth daily as needed for heartburn or indigestion.    . fenofibrate (TRICOR) 145 MG tablet Take 145 mg by mouth every evening.     . insulin aspart (NOVOLOG) 100 UNIT/ML injection Inject 0-60 Units into the skin 3 (three) times daily.    [name] lidocaine-prilocaine (EMLA) cream Apply 1 application topically as needed. 30 g 0  . lisinopril (PRINIVIL,ZESTRIL) 2.5 MG tablet Take 2.5 mg by mouth daily.    . phenazopyridine (PYRIDIUM) 200 MG tablet Take 1 tablet (200 mg total) by mouth 3 (three) times daily as needed for pain. (Patient not taking: Reported on 05/17/2019) 10 tablet 0  . prochlorperazine (COMPAZINE) 10 MG tablet Take 1 tablet (10 mg total) by mouth every 6 (six) hours as needed for nausea or vomiting. 30 tablet 0  . simvastatin (ZOCOR) 20 MG tablet Take 20 mg by mouth every evening.     . traMADol (ULTRAM) 50 MG tablet Take 1-2 tablets (50-100 mg total) by mouth every 6 (six) hours as needed for moderate pain. (Patient not taking: Reported on 05/17/2019) 15 tablet 0   No current facility-administered medications for this visit.      Allergies: No Known Allergies  Past Medical History, Surgical history, Social history, and Family History unchanged on update. [name]  Physical Exam: Blood pressure 128/60, pulse 78, temperature 98.7 F (37.1 C), temperature source Oral, resp. rate 18, height 5\' 9"  (1.753 m), weight 219 lb 11.2 oz (99.7 kg), SpO2 98 %.   ECOG: 0    General appearance: Alert, awake without any  distress. Head: Atraumatic without abnormalities Oropharynx: Without any thrush or ulcers. Eyes: No scleral icterus. Lymph nodes: No lymphadenopathy noted in the cervical, supraclavicular, or axillary nodes Heart:regular rate and rhythm, without any murmurs or gallops.   Lung: Clear to auscultation without any rhonchi, wheezes or dullness to percussion. Abdomin: Soft, nontender without any shifting dullness or ascites. Musculoskeletal: No clubbing or  cyanosis. Neurological: No motor or sensory deficits. Skin: No rashes or lesions. Psychiatric: Mood and affect appeared normal.       Lab Results: Lab Results  Component Value Date   WBC 4.0 08/28/2019   HGB 11.5 (L) 08/28/2019   HCT 33.6 (L) 08/28/2019   MCV 96.6 08/28/2019   PLT 224 08/28/2019     Chemistry      Component Value Date/Time   NA 135 08/28/2019 0901   K 4.3 08/28/2019 0901   CL 101 08/28/2019 0901   CO2 25 08/28/2019 0901   BUN 20 08/28/2019 0901   CREATININE 1.18 08/28/2019 0901      Component Value Date/Time   CALCIUM 9.0 08/28/2019 0901   ALKPHOS 49 08/28/2019 0901   AST 22 08/28/2019 0901   ALT 18 08/28/2019 0901   BILITOT 0.5 08/28/2019 0901       IMPRESSION: 1. No evidence for metastatic disease in the chest, abdomen, or pelvis. No lymphadenopathy in the abdomen/pelvis. 2. 1.8 cm low-density right interpolar renal lesion, likely a cyst. Attention on follow-up suggested. 3.  Aortic Atherosclerois (ICD10-170.0)   Impression and Plan:  73 year old man with:  1.  Bladder cancer diagnosed in April 2020.  He was found to have T2N0 high-grade urothelial carcinoma with squamous and glandular differentiation.  He has completed 4 cycles of neoadjuvant chemotherapy without any residual complications.  CT scan obtained on 08/28/2019 was personally reviewed and showed no evidence of metastatic disease.  The natural course of this disease and treatment options moving forward were reviewed.  The most definitive option at this time would be a radical cystectomy which she is scheduled for in October.  He is ready to proceed at this time.  The role for any additional systemic therapy was discussed including immunotherapy abuse will be reserved he has recurrent disease.  2. IV access:Port-A-Cath will be flushed periodically and will be kept postoperatively.  3. Antiemetics: No residual nausea or vomiting at this time.  4. Renal function  surveillance:His creatinine post cisplatin remains normal without any residual complications.  5. Goals of care:He is disease is curable at this time and aggressive measures are warranted.  6.  Neutropenia: Resolved after completing chemotherapy at this time.  7. Follow-up: 6 weeks after his surgery to follow-up on his progress.   25  minutes was spent with the patient face-to-face today.  More than 50% of time spent on reviewing his disease status, reviewing imaging studies and answering questions regarding future plan of care.   Zola Button, MD 9/3/20208:57 AM

## 2019-09-07 ENCOUNTER — Telehealth: Payer: Self-pay | Admitting: Oncology

## 2019-09-07 NOTE — Telephone Encounter (Signed)
Called and left msg. Mailed printout  °

## 2019-10-09 ENCOUNTER — Other Ambulatory Visit: Payer: Self-pay | Admitting: Urology

## 2019-10-09 ENCOUNTER — Inpatient Hospital Stay (HOSPITAL_COMMUNITY)
Admission: AD | Admit: 2019-10-09 | Discharge: 2019-10-15 | DRG: 654 | Disposition: A | Discharge status: Home / Self Care | Payer: Medicare Other | Source: Ambulatory Visit | Attending: Urology | Admitting: Urology

## 2019-10-09 ENCOUNTER — Encounter (HOSPITAL_COMMUNITY): Payer: Self-pay | Admitting: Anesthesiology

## 2019-10-09 DIAGNOSIS — I2510 Atherosclerotic heart disease of native coronary artery without angina pectoris: Secondary | ICD-10-CM | POA: Diagnosis present

## 2019-10-09 DIAGNOSIS — Z9221 Personal history of antineoplastic chemotherapy: Secondary | ICD-10-CM

## 2019-10-09 DIAGNOSIS — Z955 Presence of coronary angioplasty implant and graft: Secondary | ICD-10-CM | POA: Diagnosis not present

## 2019-10-09 DIAGNOSIS — K219 Gastro-esophageal reflux disease without esophagitis: Secondary | ICD-10-CM | POA: Diagnosis present

## 2019-10-09 DIAGNOSIS — I452 Bifascicular block: Secondary | ICD-10-CM | POA: Diagnosis present

## 2019-10-09 DIAGNOSIS — E871 Hypo-osmolality and hyponatremia: Secondary | ICD-10-CM | POA: Diagnosis not present

## 2019-10-09 DIAGNOSIS — Z8249 Family history of ischemic heart disease and other diseases of the circulatory system: Secondary | ICD-10-CM

## 2019-10-09 DIAGNOSIS — Z20828 Contact with and (suspected) exposure to other viral communicable diseases: Secondary | ICD-10-CM | POA: Diagnosis present

## 2019-10-09 DIAGNOSIS — Z79891 Long term (current) use of opiate analgesic: Secondary | ICD-10-CM

## 2019-10-09 DIAGNOSIS — R7989 Other specified abnormal findings of blood chemistry: Secondary | ICD-10-CM | POA: Diagnosis not present

## 2019-10-09 DIAGNOSIS — Z794 Long term (current) use of insulin: Secondary | ICD-10-CM | POA: Diagnosis not present

## 2019-10-09 DIAGNOSIS — I10 Essential (primary) hypertension: Secondary | ICD-10-CM | POA: Diagnosis present

## 2019-10-09 DIAGNOSIS — N400 Enlarged prostate without lower urinary tract symptoms: Secondary | ICD-10-CM | POA: Diagnosis present

## 2019-10-09 DIAGNOSIS — E119 Type 2 diabetes mellitus without complications: Secondary | ICD-10-CM | POA: Diagnosis not present

## 2019-10-09 DIAGNOSIS — C670 Malignant neoplasm of trigone of bladder: Secondary | ICD-10-CM | POA: Diagnosis not present

## 2019-10-09 DIAGNOSIS — K567 Ileus, unspecified: Secondary | ICD-10-CM | POA: Diagnosis not present

## 2019-10-09 DIAGNOSIS — R778 Other specified abnormalities of plasma proteins: Secondary | ICD-10-CM | POA: Diagnosis not present

## 2019-10-09 DIAGNOSIS — Z79899 Other long term (current) drug therapy: Secondary | ICD-10-CM | POA: Diagnosis not present

## 2019-10-09 DIAGNOSIS — C679 Malignant neoplasm of bladder, unspecified: Secondary | ICD-10-CM | POA: Diagnosis present

## 2019-10-09 DIAGNOSIS — E1165 Type 2 diabetes mellitus with hyperglycemia: Secondary | ICD-10-CM | POA: Diagnosis present

## 2019-10-09 DIAGNOSIS — Z7982 Long term (current) use of aspirin: Secondary | ICD-10-CM

## 2019-10-09 DIAGNOSIS — E1169 Type 2 diabetes mellitus with other specified complication: Secondary | ICD-10-CM | POA: Diagnosis not present

## 2019-10-09 DIAGNOSIS — I252 Old myocardial infarction: Secondary | ICD-10-CM | POA: Diagnosis not present

## 2019-10-09 DIAGNOSIS — Z87891 Personal history of nicotine dependence: Secondary | ICD-10-CM | POA: Diagnosis not present

## 2019-10-09 DIAGNOSIS — R9431 Abnormal electrocardiogram [ECG] [EKG]: Secondary | ICD-10-CM | POA: Diagnosis not present

## 2019-10-09 LAB — COMPREHENSIVE METABOLIC PANEL
ALT: 23 U/L (ref 0–44)
AST: 25 U/L (ref 15–41)
Albumin: 3.7 g/dL (ref 3.5–5.0)
Alkaline Phosphatase: 46 U/L (ref 38–126)
Anion gap: 10 (ref 5–15)
BUN: 18 mg/dL (ref 8–23)
CO2: 23 mmol/L (ref 22–32)
Calcium: 9 mg/dL (ref 8.9–10.3)
Chloride: 102 mmol/L (ref 98–111)
Creatinine, Ser: 0.85 mg/dL (ref 0.61–1.24)
GFR calc Af Amer: >60 mL/min (ref >60)
GFR calc non Af Amer: >60 mL/min (ref >60)
Glucose, Bld: 154 mg/dL — ABNORMAL HIGH (ref 70–99)
Potassium: 3.7 mmol/L (ref 3.5–5.1)
Sodium: 135 mmol/L (ref 135–145)
Total Bilirubin: 1.4 mg/dL — ABNORMAL HIGH (ref 0.3–1.2)
Total Protein: 6.9 g/dL (ref 6.5–8.1)

## 2019-10-09 LAB — CBC
HCT: 39 % (ref 39.0–52.0)
Hemoglobin: 12.8 g/dL — ABNORMAL LOW (ref 13.0–17.0)
MCH: 33.1 pg (ref 26.0–34.0)
MCHC: 32.8 g/dL (ref 30.0–36.0)
MCV: 100.8 fL — ABNORMAL HIGH (ref 80.0–100.0)
Platelets: 213 10*3/uL (ref 150–400)
RBC: 3.87 MIL/uL — ABNORMAL LOW (ref 4.22–5.81)
RDW: 12.9 % (ref 11.5–15.5)
WBC: 4.6 10*3/uL (ref 4.0–10.5)
nRBC: 0 % (ref 0.0–0.2)

## 2019-10-09 LAB — SARS CORONAVIRUS 2 BY RT PCR (HOSPITAL ORDER, PERFORMED IN ~~LOC~~ HOSPITAL LAB): SARS Coronavirus 2: NEGATIVE

## 2019-10-09 LAB — GLUCOSE, CAPILLARY
Glucose-Capillary: 149 mg/dL — ABNORMAL HIGH (ref 70–99)
Glucose-Capillary: 162 mg/dL — ABNORMAL HIGH (ref 70–99)

## 2019-10-09 LAB — MRSA PCR SCREENING: MRSA by PCR: NEGATIVE

## 2019-10-09 MED ORDER — SODIUM CHLORIDE 0.9% FLUSH
10.0000 mL | Freq: Two times a day (BID) | INTRAVENOUS | Status: DC
  Administered 2019-10-09 – 2019-10-15 (×10): 10 mL

## 2019-10-09 MED ORDER — INSULIN ASPART 100 UNIT/ML ~~LOC~~ SOLN
0.0000 [IU] | Freq: Three times a day (TID) | SUBCUTANEOUS | Status: DC
  Administered 2019-10-09: 2 [IU] via SUBCUTANEOUS
  Administered 2019-10-10: 16:00:00 11 [IU] via SUBCUTANEOUS
  Administered 2019-10-10: 17:00:00 15 [IU] via SUBCUTANEOUS
  Administered 2019-10-11: 8 [IU] via SUBCUTANEOUS
  Administered 2019-10-11 (×2): 5 [IU] via SUBCUTANEOUS
  Administered 2019-10-12: 8 [IU] via SUBCUTANEOUS
  Administered 2019-10-12: 5 [IU] via SUBCUTANEOUS

## 2019-10-09 MED ORDER — PIPERACILLIN-TAZOBACTAM 3.375 G IVPB 30 MIN: 3.3750 g | INTRAVENOUS | Status: DC

## 2019-10-09 MED ORDER — PIPERACILLIN-TAZOBACTAM 3.375 G IVPB 30 MIN
3.3750 g | INTRAVENOUS | Status: AC
  Administered 2019-10-10: 09:00:00 3.375 g via INTRAVENOUS
  Filled 2019-10-09: qty 50

## 2019-10-09 MED ORDER — INSULIN ASPART 100 UNIT/ML ~~LOC~~ SOLN
3.0000 [IU] | Freq: Three times a day (TID) | SUBCUTANEOUS | Status: DC
  Administered 2019-10-11 – 2019-10-12 (×3): 3 [IU] via SUBCUTANEOUS

## 2019-10-09 MED ORDER — CHLORHEXIDINE GLUCONATE CLOTH 2 % EX PADS
6.0000 | MEDICATED_PAD | Freq: Every day | CUTANEOUS | Status: DC
  Administered 2019-10-09: 6 via TOPICAL

## 2019-10-09 MED ORDER — INSULIN ASPART 100 UNIT/ML ~~LOC~~ SOLN
0.0000 [IU] | Freq: Every day | SUBCUTANEOUS | Status: DC
  Administered 2019-10-10: 23:00:00 3 [IU] via SUBCUTANEOUS

## 2019-10-09 MED ORDER — PEG 3350-KCL-NA BICARB-NACL 420 G PO SOLR
4000.0000 mL | Freq: Once | ORAL | Status: AC
  Administered 2019-10-09: 4000 mL via ORAL

## 2019-10-09 MED ORDER — NEOMYCIN SULFATE 500 MG PO TABS
1000.0000 mg | ORAL_TABLET | ORAL | Status: AC
  Administered 2019-10-09 – 2019-10-10 (×3): 1000 mg via ORAL
  Filled 2019-10-09 (×3): qty 2

## 2019-10-09 MED ORDER — SODIUM CHLORIDE 0.9% FLUSH
10.0000 mL | INTRAVENOUS | Status: DC | PRN
  Administered 2019-10-15: 10 mL
  Filled 2019-10-09: qty 40

## 2019-10-09 MED ORDER — PEG 3350-KCL-NA BICARB-NACL 420 G PO SOLR: 4000.0000 mL | Freq: Once | ORAL | Status: DC

## 2019-10-09 MED ORDER — CARVEDILOL 3.125 MG PO TABS
3.1250 mg | ORAL_TABLET | Freq: Every day | ORAL | Status: DC
  Administered 2019-10-11 – 2019-10-15 (×5): 3.125 mg via ORAL
  Filled 2019-10-09 (×5): qty 1

## 2019-10-09 MED ORDER — METRONIDAZOLE 500 MG PO TABS
500.0000 mg | ORAL_TABLET | ORAL | Status: AC
  Administered 2019-10-09 – 2019-10-10 (×3): 500 mg via ORAL
  Filled 2019-10-09 (×3): qty 1

## 2019-10-09 MED ORDER — SIMVASTATIN 20 MG PO TABS
20.0000 mg | ORAL_TABLET | Freq: Every day | ORAL | Status: DC
  Administered 2019-10-09 – 2019-10-14 (×6): 20 mg via ORAL
  Filled 2019-10-09: qty 2
  Filled 2019-10-09: qty 1
  Filled 2019-10-09 (×3): qty 2
  Filled 2019-10-09: qty 1

## 2019-10-09 MED ORDER — SODIUM CHLORIDE 0.9 % IV SOLN
INTRAVENOUS | Status: DC
  Administered 2019-10-09: 21:00:00 via INTRAVENOUS

## 2019-10-09 NOTE — Consult Note (Addendum)
[facility] Nurse ostomy consult note  [place] Nurse requested for preoperative stoma site marking by Dr.T. Tresa Moore.  Discussed surgical procedure and stoma creation with patient and family.  Explained role of the [facility] nurse team.  Answered patient and family questions.   Examined patient lying, sitting, and standing in order to place the marking in the patient's visual field, away from any creases or abdominal contour issues and within the rectus muscle.    Marked for ileal conduit in the RUQ  7.5 cm to the right of the umbilicus and  3 cm above the umbilicus.  Patient's abdomen cleansed with CHG wipes at site markings, allowed to air dry prior to marking.Covered mark with thin film transparent dressing to preserve mark until date of surgery (tomorrow, 10/10/19).   [facility] Nurse team will follow up with patient after surgery for continued ostomy care and teaching.   Thank you for allowing [place] to meet and preoperatively mark this nice gentleman.  Maudie Flakes, MSN, RN, [place], [facility]  Pager# [phone]
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
## 2019-10-10 ENCOUNTER — Inpatient Hospital Stay (HOSPITAL_COMMUNITY): Payer: Medicare Other | Admitting: Anesthesiology

## 2019-10-10 ENCOUNTER — Encounter (HOSPITAL_COMMUNITY)
Admission: AD | Disposition: A | Discharge status: Home / Self Care | Payer: Self-pay | Source: Ambulatory Visit | Attending: Urology

## 2019-10-10 ENCOUNTER — Encounter (HOSPITAL_COMMUNITY): Payer: Self-pay | Admitting: Anesthesiology

## 2019-10-10 DIAGNOSIS — C679 Malignant neoplasm of bladder, unspecified: Secondary | ICD-10-CM | POA: Diagnosis present

## 2019-10-10 HISTORY — PX: CYSTOSCOPY WITH INJECTION: SHX1424

## 2019-10-10 LAB — TYPE AND SCREEN
ABO/RH(D): O POS
Antibody Screen: NEGATIVE

## 2019-10-10 LAB — ABO/RH: ABO/RH(D): O POS

## 2019-10-10 LAB — GLUCOSE, CAPILLARY
Glucose-Capillary: 347 mg/dL — ABNORMAL HIGH (ref 70–99)
Glucose-Capillary: 371 mg/dL — ABNORMAL HIGH (ref 70–99)
Glucose-Capillary: 291 mg/dL — ABNORMAL HIGH (ref 70–99)

## 2019-10-10 LAB — HEMOGLOBIN AND HEMATOCRIT, BLOOD
HCT: 36.8 % — ABNORMAL LOW (ref 39.0–52.0)
Hemoglobin: 12.1 g/dL — ABNORMAL LOW (ref 13.0–17.0)

## 2019-10-10 SURGERY — ROBOT ASSISTED LAPAROSCOPIC RADICAL CYSTOPROSTATECTOMY BILATERAL PELVIC LYMPHADENECTOMY,ORTHOTOPIC NEOBLADDER
Anesthesia: General

## 2019-10-10 MED ORDER — PHENYLEPHRINE 40 MCG/ML (10ML) SYRINGE FOR IV PUSH (FOR BLOOD PRESSURE SUPPORT)
PREFILLED_SYRINGE | INTRAVENOUS | Status: AC
  Filled 2019-10-10: qty 10

## 2019-10-10 MED ORDER — HEPARIN SOD (PORK) LOCK FLUSH 100 UNIT/ML IV SOLN
500.0000 [IU] | INTRAVENOUS | Status: DC
  Filled 2019-10-10: qty 5

## 2019-10-10 MED ORDER — DIPHENHYDRAMINE HCL 12.5 MG/5ML PO ELIX: 12.5000 mg | ORAL_SOLUTION | Freq: Four times a day (QID) | ORAL | Status: DC | PRN

## 2019-10-10 MED ORDER — ONDANSETRON HCL 4 MG/2ML IJ SOLN
INTRAMUSCULAR | Status: AC
  Filled 2019-10-10: qty 4

## 2019-10-10 MED ORDER — ACETAMINOPHEN 10 MG/ML IV SOLN
1000.0000 mg | Freq: Four times a day (QID) | INTRAVENOUS | Status: AC
  Administered 2019-10-10 – 2019-10-11 (×4): 1000 mg via INTRAVENOUS
  Filled 2019-10-10 (×4): qty 100

## 2019-10-10 MED ORDER — EPHEDRINE SULFATE-NACL 50-0.9 MG/10ML-% IV SOSY
PREFILLED_SYRINGE | INTRAVENOUS | Status: DC | PRN
  Administered 2019-10-10 (×2): 5 mg via INTRAVENOUS

## 2019-10-10 MED ORDER — SUCCINYLCHOLINE CHLORIDE 200 MG/10ML IV SOSY
PREFILLED_SYRINGE | INTRAVENOUS | Status: DC | PRN
  Administered 2019-10-10: 120 mg via INTRAVENOUS

## 2019-10-10 MED ORDER — FENTANYL CITRATE (PF) 250 MCG/5ML IJ SOLN
INTRAMUSCULAR | Status: AC
  Filled 2019-10-10: qty 5

## 2019-10-10 MED ORDER — LACTATED RINGERS IR SOLN
Status: DC | PRN
  Administered 2019-10-10: 1000 mL

## 2019-10-10 MED ORDER — PROPOFOL 10 MG/ML IV BOLUS
INTRAVENOUS | Status: DC | PRN
  Administered 2019-10-10: 150 mg via INTRAVENOUS

## 2019-10-10 MED ORDER — ONDANSETRON HCL 4 MG/2ML IJ SOLN
4.0000 mg | INTRAMUSCULAR | Status: DC | PRN
  Administered 2019-10-11 – 2019-10-12 (×5): 4 mg via INTRAVENOUS
  Filled 2019-10-10 (×5): qty 2

## 2019-10-10 MED ORDER — SODIUM CHLORIDE (PF) 0.9 % IJ SOLN
INTRAMUSCULAR | Status: AC
  Filled 2019-10-10: qty 20

## 2019-10-10 MED ORDER — SUCCINYLCHOLINE CHLORIDE 200 MG/10ML IV SOSY
PREFILLED_SYRINGE | INTRAVENOUS | Status: AC
  Filled 2019-10-10: qty 10

## 2019-10-10 MED ORDER — OXYCODONE HCL 5 MG PO TABS: 5.0000 mg | ORAL_TABLET | Freq: Once | ORAL | Status: DC | PRN

## 2019-10-10 MED ORDER — HEPARIN SOD (PORK) LOCK FLUSH 100 UNIT/ML IV SOLN
INTRAVENOUS | Status: AC
  Filled 2019-10-10: qty 5

## 2019-10-10 MED ORDER — OXYCODONE HCL 5 MG/5ML PO SOLN: 5.0000 mg | Freq: Once | ORAL | Status: DC | PRN

## 2019-10-10 MED ORDER — ORAL CARE MOUTH RINSE
15.0000 mL | Freq: Two times a day (BID) | OROMUCOSAL | Status: DC
  Administered 2019-10-10 – 2019-10-15 (×8): 15 mL via OROMUCOSAL

## 2019-10-10 MED ORDER — ONDANSETRON HCL 4 MG/2ML IJ SOLN
INTRAMUSCULAR | Status: DC | PRN
  Administered 2019-10-10 (×2): 4 mg via INTRAVENOUS

## 2019-10-10 MED ORDER — LACTATED RINGERS IV SOLN
INTRAVENOUS | Status: DC
  Administered 2019-10-10: 08:00:00 via INTRAVENOUS

## 2019-10-10 MED ORDER — LIDOCAINE 2% (20 MG/ML) 5 ML SYRINGE
INTRAMUSCULAR | Status: AC
  Filled 2019-10-10: qty 5

## 2019-10-10 MED ORDER — BUPIVACAINE HCL (PF) 0.25 % IJ SOLN
INTRAMUSCULAR | Status: AC
  Filled 2019-10-10: qty 30

## 2019-10-10 MED ORDER — FENTANYL CITRATE (PF) 100 MCG/2ML IJ SOLN
INTRAMUSCULAR | Status: DC | PRN
  Administered 2019-10-10: 100 ug via INTRAVENOUS
  Administered 2019-10-10 (×7): 50 ug via INTRAVENOUS

## 2019-10-10 MED ORDER — CHLORHEXIDINE GLUCONATE CLOTH 2 % EX PADS
6.0000 | MEDICATED_PAD | Freq: Every day | CUTANEOUS | Status: DC
  Administered 2019-10-10 – 2019-10-15 (×6): 6 via TOPICAL

## 2019-10-10 MED ORDER — HEPARIN SOD (PORK) LOCK FLUSH 100 UNIT/ML IV SOLN
500.0000 [IU] | INTRAVENOUS | Status: DC | PRN
  Administered 2019-10-10: 500 [IU]
  Filled 2019-10-10 (×2): qty 5

## 2019-10-10 MED ORDER — EPHEDRINE 5 MG/ML INJ
INTRAVENOUS | Status: AC
  Filled 2019-10-10: qty 10

## 2019-10-10 MED ORDER — FENTANYL CITRATE (PF) 100 MCG/2ML IJ SOLN
25.0000 ug | INTRAMUSCULAR | Status: AC | PRN
  Administered 2019-10-10 (×6): 25 ug via INTRAVENOUS

## 2019-10-10 MED ORDER — LIDOCAINE 2% (20 MG/ML) 5 ML SYRINGE
INTRAMUSCULAR | Status: DC | PRN
  Administered 2019-10-10: 100 mg via INTRAVENOUS

## 2019-10-10 MED ORDER — LACTATED RINGERS IV SOLN
INTRAVENOUS | Status: DC
  Administered 2019-10-10 (×2): via INTRAVENOUS

## 2019-10-10 MED ORDER — PIPERACILLIN-TAZOBACTAM 3.375 G IVPB
3.3750 g | Freq: Three times a day (TID) | INTRAVENOUS | Status: AC
  Administered 2019-10-10 – 2019-10-11 (×3): 3.375 g via INTRAVENOUS
  Filled 2019-10-10 (×3): qty 50

## 2019-10-10 MED ORDER — SODIUM CHLORIDE 0.45 % IV SOLN
INTRAVENOUS | Status: DC
  Administered 2019-10-10 – 2019-10-12 (×2): via INTRAVENOUS

## 2019-10-10 MED ORDER — DEXAMETHASONE SODIUM PHOSPHATE 10 MG/ML IJ SOLN
INTRAMUSCULAR | Status: AC
  Filled 2019-10-10: qty 1

## 2019-10-10 MED ORDER — HYDROMORPHONE HCL 1 MG/ML IJ SOLN: 0.5000 mg | INTRAMUSCULAR | Status: DC | PRN

## 2019-10-10 MED ORDER — FENTANYL CITRATE (PF) 100 MCG/2ML IJ SOLN
INTRAMUSCULAR | Status: AC
  Filled 2019-10-10: qty 2

## 2019-10-10 MED ORDER — INSULIN ASPART 100 UNIT/ML ~~LOC~~ SOLN
SUBCUTANEOUS | Status: AC
  Administered 2019-10-10: 15 [IU] via SUBCUTANEOUS
  Filled 2019-10-10: qty 1

## 2019-10-10 MED ORDER — FENTANYL CITRATE (PF) 100 MCG/2ML IJ SOLN
INTRAMUSCULAR | Status: AC
  Administered 2019-10-10: 25 ug via INTRAVENOUS
  Filled 2019-10-10: qty 2

## 2019-10-10 MED ORDER — DEXMEDETOMIDINE HCL IN NACL 200 MCG/50ML IV SOLN
INTRAVENOUS | Status: DC | PRN
  Administered 2019-10-10 (×2): 4 ug via INTRAVENOUS

## 2019-10-10 MED ORDER — SODIUM CHLORIDE (PF) 0.9 % IJ SOLN
INTRAMUSCULAR | Status: DC | PRN
  Administered 2019-10-10: 20 mL

## 2019-10-10 MED ORDER — DEXAMETHASONE SODIUM PHOSPHATE 10 MG/ML IJ SOLN
INTRAMUSCULAR | Status: DC | PRN
  Administered 2019-10-10: 4 mg via INTRAVENOUS

## 2019-10-10 MED ORDER — DIPHENHYDRAMINE HCL 50 MG/ML IJ SOLN: 12.5000 mg | Freq: Four times a day (QID) | INTRAMUSCULAR | Status: DC | PRN

## 2019-10-10 MED ORDER — ALVIMOPAN 12 MG PO CAPS
12.0000 mg | ORAL_CAPSULE | Freq: Two times a day (BID) | ORAL | Status: DC
  Administered 2019-10-11 – 2019-10-12 (×2): 12 mg via ORAL
  Filled 2019-10-10 (×4): qty 1

## 2019-10-10 MED ORDER — STERILE WATER FOR INJECTION IJ SOLN
INTRAMUSCULAR | Status: AC
  Filled 2019-10-10: qty 20

## 2019-10-10 MED ORDER — SUGAMMADEX SODIUM 200 MG/2ML IV SOLN
INTRAVENOUS | Status: DC | PRN
  Administered 2019-10-10: 200 mg via INTRAVENOUS

## 2019-10-10 MED ORDER — FAMOTIDINE IN NACL 20-0.9 MG/50ML-% IV SOLN
20.0000 mg | Freq: Once | INTRAVENOUS | Status: AC
  Administered 2019-10-10: 20 mg via INTRAVENOUS
  Filled 2019-10-10: qty 50

## 2019-10-10 MED ORDER — ALVIMOPAN 12 MG PO CAPS
12.0000 mg | ORAL_CAPSULE | ORAL | Status: AC
  Administered 2019-10-10: 12 mg via ORAL
  Filled 2019-10-10: qty 1

## 2019-10-10 MED ORDER — BUPIVACAINE LIPOSOME 1.3 % IJ SUSP
20.0000 mL | Freq: Once | INTRAMUSCULAR | Status: AC
  Administered 2019-10-10: 20 mL
  Filled 2019-10-10: qty 20

## 2019-10-10 MED ORDER — LIDOCAINE 2% (20 MG/ML) 5 ML SYRINGE
INTRAMUSCULAR | Status: DC | PRN
  Administered 2019-10-10: 1 mg/kg/h via INTRAVENOUS

## 2019-10-10 MED ORDER — OXYCODONE HCL 5 MG PO TABS
5.0000 mg | ORAL_TABLET | ORAL | Status: DC | PRN
  Administered 2019-10-10 – 2019-10-11 (×2): 5 mg via ORAL
  Filled 2019-10-10 (×2): qty 1

## 2019-10-10 MED ORDER — ROCURONIUM BROMIDE 10 MG/ML (PF) SYRINGE
PREFILLED_SYRINGE | INTRAVENOUS | Status: AC
  Filled 2019-10-10: qty 20

## 2019-10-10 MED ORDER — PROPOFOL 10 MG/ML IV BOLUS
INTRAVENOUS | Status: AC
  Filled 2019-10-10: qty 40

## 2019-10-10 MED ORDER — ROCURONIUM BROMIDE 10 MG/ML (PF) SYRINGE
PREFILLED_SYRINGE | INTRAVENOUS | Status: DC | PRN
  Administered 2019-10-10: 10 mg via INTRAVENOUS
  Administered 2019-10-10: 20 mg via INTRAVENOUS
  Administered 2019-10-10: 50 mg via INTRAVENOUS
  Administered 2019-10-10 (×4): 20 mg via INTRAVENOUS

## 2019-10-10 MED ORDER — ONDANSETRON HCL 4 MG/2ML IJ SOLN: 4.0000 mg | Freq: Four times a day (QID) | INTRAMUSCULAR | Status: DC | PRN

## 2019-10-10 MED ORDER — MIDAZOLAM HCL 2 MG/2ML IJ SOLN
INTRAMUSCULAR | Status: AC
  Filled 2019-10-10: qty 2

## 2019-10-10 MED ORDER — MIDAZOLAM HCL 5 MG/5ML IJ SOLN
INTRAMUSCULAR | Status: DC | PRN
  Administered 2019-10-10: 2 mg via INTRAVENOUS

## 2019-10-10 MED ORDER — PHENYLEPHRINE 40 MCG/ML (10ML) SYRINGE FOR IV PUSH (FOR BLOOD PRESSURE SUPPORT)
PREFILLED_SYRINGE | INTRAVENOUS | Status: DC | PRN
  Administered 2019-10-10: 80 ug via INTRAVENOUS
  Administered 2019-10-10 (×2): 40 ug via INTRAVENOUS
  Administered 2019-10-10 (×2): 80 ug via INTRAVENOUS
  Administered 2019-10-10: 40 ug via INTRAVENOUS
  Administered 2019-10-10 (×5): 80 ug via INTRAVENOUS
  Administered 2019-10-10: 40 ug via INTRAVENOUS
  Administered 2019-10-10 (×3): 80 ug via INTRAVENOUS

## 2019-10-10 MED ORDER — WATER FOR IRRIGATION, STERILE IR SOLN
Status: DC | PRN
  Administered 2019-10-10: 1000 mL

## 2019-10-10 MED ORDER — LIDOCAINE HCL 2 % IJ SOLN
INTRAMUSCULAR | Status: AC
  Filled 2019-10-10: qty 40

## 2019-10-10 MED ORDER — ONDANSETRON HCL 4 MG/2ML IJ SOLN
INTRAMUSCULAR | Status: AC
  Filled 2019-10-10: qty 2

## 2019-10-10 MED ORDER — INSULIN ASPART 100 UNIT/ML ~~LOC~~ SOLN
SUBCUTANEOUS | Status: AC
  Administered 2019-10-10: 11 [IU] via SUBCUTANEOUS
  Filled 2019-10-10: qty 1

## 2019-10-10 MED ORDER — LIDOCAINE HCL 2 % IJ SOLN
INTRAMUSCULAR | Status: AC
  Filled 2019-10-10: qty 20

## 2019-10-10 SURGICAL SUPPLY — 98 items
APPLICATOR COTTON TIP 6 STRL (MISCELLANEOUS) ×1 IMPLANT
APPLICATOR COTTON TIP 6IN STRL (MISCELLANEOUS) ×2
APPLICATOR SURGIFLO ENDO (HEMOSTASIS) IMPLANT
BAG LAPAROSCOPIC 12 15 PORT 16 (BASKET) ×1 IMPLANT
BAG RETRIEVAL 12/15 (BASKET) ×2
BAG URO CATCHER STRL LF (MISCELLANEOUS) IMPLANT
BENZOIN TINCTURE PRP APPL 2/3 (GAUZE/BANDAGES/DRESSINGS) IMPLANT
BLADE HEX COATED 2.75 (ELECTRODE) ×2 IMPLANT
BLADE SURG SZ10 CARB STEEL (BLADE) IMPLANT
CATH SILICONE 5CC 18FR (INSTRUMENTS) ×2 IMPLANT
CELLS DAT CNTRL 66122 CELL SVR (MISCELLANEOUS) ×1 IMPLANT
CHLORAPREP W/TINT 26 (MISCELLANEOUS) ×2 IMPLANT
CLIP VESOLOCK LG 6/CT PURPLE (CLIP) ×4 IMPLANT
CLIP VESOLOCK MED LG 6/CT (CLIP) ×2 IMPLANT
CLIP VESOLOCK XL 6/CT (CLIP) ×2 IMPLANT
CLOTH BEACON ORANGE TIMEOUT ST (SAFETY) ×2 IMPLANT
CONT SPEC 4OZ CLIKSEAL STRL BL (MISCELLANEOUS) ×2 IMPLANT
COVER SURGICAL LIGHT HANDLE (MISCELLANEOUS) ×2 IMPLANT
COVER TIP SHEARS 8 DVNC (MISCELLANEOUS) ×1 IMPLANT
COVER TIP SHEARS 8MM DA VINCI (MISCELLANEOUS) ×1
COVER WAND RF STERILE (DRAPES) IMPLANT
DECANTER SPIKE VIAL GLASS SM (MISCELLANEOUS) ×4 IMPLANT
DERMABOND ADVANCED (GAUZE/BANDAGES/DRESSINGS) ×1
DERMABOND ADVANCED .7 DNX12 (GAUZE/BANDAGES/DRESSINGS) ×1 IMPLANT
DRAIN CHANNEL RND F F (WOUND CARE) IMPLANT
DRAIN PENROSE 18X1/2 LTX STRL (DRAIN) IMPLANT
DRAPE ARM DVNC X/XI (DISPOSABLE) ×4 IMPLANT
DRAPE COLUMN DVNC XI (DISPOSABLE) ×1 IMPLANT
DRAPE DA VINCI XI ARM (DISPOSABLE) ×4
DRAPE DA VINCI XI COLUMN (DISPOSABLE) ×1
DRSG TEGADERM 4X4.75 (GAUZE/BANDAGES/DRESSINGS) IMPLANT
ELECT PENCIL ROCKER SW 15FT (MISCELLANEOUS) ×2 IMPLANT
ELECT REM PT RETURN 15FT ADLT (MISCELLANEOUS) ×2 IMPLANT
EVACUATOR SILICONE 100CC (DRAIN) ×2 IMPLANT
GAUZE 4X4 16PLY RFD (DISPOSABLE) IMPLANT
GLOVE BIO SURGEON STRL SZ 6.5 (GLOVE) ×8 IMPLANT
GLOVE BIOGEL M STRL SZ7.5 (GLOVE) ×6 IMPLANT
GLOVE BIOGEL PI IND STRL 7.5 (GLOVE) ×1 IMPLANT
GLOVE BIOGEL PI INDICATOR 7.5 (GLOVE) ×1
GOWN STRL REUS W/TWL LRG LVL3 (GOWN DISPOSABLE) ×10 IMPLANT
GOWN STRL REUS W/TWL XL LVL3 (GOWN DISPOSABLE) ×2 IMPLANT
IRRIG SUCT STRYKERFLOW 2 WTIP (MISCELLANEOUS) ×2
IRRIGATION SUCT STRKRFLW 2 WTP (MISCELLANEOUS) ×1 IMPLANT
KIT PROCEDURE DA VINCI SI (MISCELLANEOUS)
KIT PROCEDURE DVNC SI (MISCELLANEOUS) IMPLANT
KIT TURNOVER KIT A (KITS) IMPLANT
LOOP VESSEL MAXI BLUE (MISCELLANEOUS) ×2 IMPLANT
MANIFOLD NEPTUNE II (INSTRUMENTS) ×2 IMPLANT
NEEDLE ASPIRATION 22 (NEEDLE) ×2 IMPLANT
NEEDLE INSUFFLATION 14GA 120MM (NEEDLE) ×2 IMPLANT
PACK CYSTO (CUSTOM PROCEDURE TRAY) ×2 IMPLANT
PACK ROBOT UROLOGY CUSTOM (CUSTOM PROCEDURE TRAY) ×2 IMPLANT
PAD POSITIONING PINK XL (MISCELLANEOUS) ×2 IMPLANT
RELOAD STAPLER GREEN 60MM (STAPLE) ×5 IMPLANT
RELOAD STAPLER WHITE 60MM (STAPLE) ×10 IMPLANT
RETRACTOR LONRSTAR 16.6X16.6CM (MISCELLANEOUS) IMPLANT
RETRACTOR STAY HOOK 5MM (MISCELLANEOUS) IMPLANT
RETRACTOR STER APS 16.6X16.6CM (MISCELLANEOUS)
RTRCTR WOUND ALEXIS 18CM MED (MISCELLANEOUS) ×2
SEAL CANN UNIV 5-8 DVNC XI (MISCELLANEOUS) ×4 IMPLANT
SEAL XI 5MM-8MM UNIVERSAL (MISCELLANEOUS) ×4
SET TUBE SMOKE EVAC HIGH FLOW (TUBING) ×2 IMPLANT
SOLUTION ELECTROLUBE (MISCELLANEOUS) ×2 IMPLANT
SPONGE LAP 18X18 RF (DISPOSABLE) ×4 IMPLANT
SPONGE LAP 4X18 RFD (DISPOSABLE) ×2 IMPLANT
STAPLER ECHELON LONG 60 440 (INSTRUMENTS) ×2 IMPLANT
STAPLER RELOAD GREEN 60MM (STAPLE) ×10
STAPLER RELOAD WHITE 60MM (STAPLE) ×20
STENT SET URETHERAL LEFT 7FR (STENTS) ×2 IMPLANT
STENT SET URETHERAL RIGHT 7FR (STENTS) ×2 IMPLANT
SURGIFLO W/THROMBIN 8M KIT (HEMOSTASIS) IMPLANT
SUT CHROMIC 4 0 RB 1X27 (SUTURE) ×2 IMPLANT
SUT ETHILON 3 0 PS 1 (SUTURE) ×2 IMPLANT
SUT MNCRL AB 4-0 PS2 18 (SUTURE) ×4 IMPLANT
SUT PDS AB 0 CTX 36 PDP370T (SUTURE) ×6 IMPLANT
SUT SILK 3 0 SH 30 (SUTURE) IMPLANT
SUT SILK 3 0 SH CR/8 (SUTURE) ×2 IMPLANT
SUT V-LOC BARB 180 2/0GR6 GS22 (SUTURE)
SUT VIC AB 2-0 CT1 27 (SUTURE) ×2
SUT VIC AB 2-0 CT1 27XBRD (SUTURE) ×2 IMPLANT
SUT VIC AB 2-0 SH 18 (SUTURE) IMPLANT
SUT VIC AB 2-0 UR5 27 (SUTURE) ×8 IMPLANT
SUT VIC AB 3-0 SH 27 (SUTURE) ×6
SUT VIC AB 3-0 SH 27X BRD (SUTURE) ×2 IMPLANT
SUT VIC AB 3-0 SH 27XBRD (SUTURE) ×4 IMPLANT
SUT VIC AB 4-0 RB1 27 (SUTURE) ×4
SUT VIC AB 4-0 RB1 27XBRD (SUTURE) ×4 IMPLANT
SUT VLOC BARB 180 ABS3/0GR12 (SUTURE) ×2
SUTURE V-LC BRB 180 2/0GR6GS22 (SUTURE) IMPLANT
SUTURE VLOC BRB 180 ABS3/0GR12 (SUTURE) ×1 IMPLANT
SYR CONTROL 10ML LL (SYRINGE) IMPLANT
SYSTEM UROSTOMY GENTLE TOUCH (WOUND CARE) ×2 IMPLANT
TOWEL OR NON WOVEN STRL DISP B (DISPOSABLE) ×2 IMPLANT
TROCAR BLADELESS 15MM (ENDOMECHANICALS) ×2 IMPLANT
TROCAR XCEL NON-BLD 5MMX100MML (ENDOMECHANICALS) IMPLANT
TUBING CONNECTING 10 (TUBING) IMPLANT
WATER STERILE IRR 1000ML POUR (IV SOLUTION) ×4 IMPLANT
YANKAUER SUCT BULB TIP 10FT TU (MISCELLANEOUS) ×2 IMPLANT

## 2019-10-10 NOTE — Anesthesia Preprocedure Evaluation (Signed)
Anesthesia Evaluation  Patient identified by MRN, date of birth, ID band Patient awake    Reviewed: Allergy & Precautions, H&P , NPO status , Patient's Chart, lab work & pertinent test results  Airway Mallampati: II   Neck ROM: full    Dental   Pulmonary former smoker,    breath sounds clear to auscultation       Cardiovascular hypertension, + CAD and + Past MI  + dysrhythmias  Rhythm:regular Rate:Normal  bifasicular block. 1st degree AVB   Neuro/Psych    GI/Hepatic GERD  ,  Endo/Other  diabetes, Type 2  Renal/GU      Musculoskeletal   Abdominal   Peds  Hematology   Anesthesia Other Findings   Reproductive/Obstetrics                             Anesthesia Physical Anesthesia Plan  ASA: III  Anesthesia Plan: General   Post-op Pain Management:    Induction: Intravenous  PONV Risk Score and Plan: 2 and Ondansetron, Dexamethasone, Midazolam and Treatment may vary due to age or medical condition  Airway Management Planned: Oral ETT  Additional Equipment: Arterial line  Intra-op Plan:   Post-operative Plan: Extubation in OR  Informed Consent: I have reviewed the patients History and Physical, chart, labs and discussed the procedure including the risks, benefits and alternatives for the proposed anesthesia with the patient or authorized representative who has indicated his/her understanding and acceptance.       Plan Discussed with: CRNA, Anesthesiologist and Surgeon  Anesthesia Plan Comments:         Anesthesia Quick Evaluation

## 2019-10-10 NOTE — Brief Op Note (Signed)
10/09/2019 - 10/10/2019  2:51 PM  PATIENT:  Gregory Cummings  73 y.o. male  PRE-OPERATIVE DIAGNOSIS:  BLADDER CANCER  POST-OPERATIVE DIAGNOSIS:  BLADDER CANCER  PROCEDURE:  Procedure(s) with comments: ROBOT ASSISTED LAPAROSCOPIC RADICAL CYSTOPROSTATECTOMY BILATERAL PELVIC LYMPHADENECTOMY,ILEAL CONDUIT (N/A) - 6 HRS CYSTOSCOPY WITH INJECTION (N/A)  SURGEON:  Surgeon(s) and Role:    * Alexis Frock, MD - Primary  PHYSICIAN ASSISTANT:   ASSISTANTS: Clemetine Marker PA   ANESTHESIA:   local and general  EBL:  200 mL   BLOOD ADMINISTERED:none  DRAINS: 1 - JP to bulb; 2 - RLQ Urostomy to gravity wtih Rt (red) and Lt (blue) bander stents   LOCAL MEDICATIONS USED:  MARCAINE     SPECIMEN:  Source of Specimen:  1 - distal ureteral margins; 2 - pelvic lymph nodes; 3 - cystoprostatectomy  DISPOSITION OF SPECIMEN:  PATHOLOGY  COUNTS:  YES  TOURNIQUET:  * No tourniquets in log *  DICTATION: .Other Dictation: Dictation Number  [id number]  PLAN OF CARE: Admit to inpatient   PATIENT DISPOSITION:  PACU - hemodynamically stable.   Delay start of Pharmacological VTE agent (>24hrs) due to surgical blood loss or risk of bleeding: yes

## 2019-10-10 NOTE — Anesthesia Procedure Notes (Signed)
Procedure Name: Intubation Date/Time: 10/10/2019 8:45 AM Performed by: Lavina Hamman, CRNA Pre-anesthesia Checklist: Patient identified, Emergency Drugs available, Suction available, Patient being monitored and Timeout performed Patient Re-evaluated:Patient Re-evaluated prior to induction Oxygen Delivery Method: Circle system utilized Preoxygenation: Pre-oxygenation with 100% oxygen Induction Type: IV induction, Rapid sequence and Cricoid Pressure applied Laryngoscope Size: Mac and 4 Grade View: Grade II Tube type: Oral Tube size: 7.5 mm Number of attempts: 1 Airway Equipment and Method: Stylet Placement Confirmation: ETT inserted through vocal cords under direct vision,  positive ETCO2,  CO2 detector and breath sounds checked- equal and bilateral Secured at: 22 cm Tube secured with: Tape Dental Injury: Teeth and Oropharynx as per pre-operative assessment

## 2019-10-10 NOTE — Transfer of Care (Signed)
Immediate Anesthesia Transfer of Care Note  Patient: Gregory Cummings  Procedure(s) Performed: ROBOT ASSISTED LAPAROSCOPIC RADICAL CYSTOPROSTATECTOMY BILATERAL PELVIC LYMPHADENECTOMY,ILEAL CONDUIT (N/A ) CYSTOSCOPY WITH INJECTION (N/A )  Patient Location: PACU  Anesthesia Type:General  Level of Consciousness: drowsy and patient cooperative  Airway & Oxygen Therapy: Patient Spontanous Breathing and Patient connected to face mask oxygen  Post-op Assessment: Report given to RN and Post -op Vital signs reviewed and stable  Post vital signs: Reviewed and stable  Last Vitals:  Vitals Value Taken Time  BP    Temp    Pulse 85 10/10/19 1523  Resp 21 10/10/19 1523  SpO2 98 % 10/10/19 1523  Vitals shown include unvalidated device data.  Last Pain:  Vitals:   10/10/19 0454  TempSrc: Oral  PainSc:          Complications: No apparent anesthesia complications

## 2019-10-10 NOTE — Anesthesia Procedure Notes (Signed)
Arterial Line Insertion Start/End10/14/2020 8:00 AM, 10/10/2019 8:11 AM Performed by: Lavina Hamman, CRNA, CRNA  Patient location: Pre-op. Preanesthetic checklist: patient identified, IV checked, site marked, risks and benefits discussed, surgical consent, monitors and equipment checked, pre-op evaluation, timeout performed and anesthesia consent Lidocaine 1% used for infiltration Right, radial was placed Catheter size: 20 Fr Hand hygiene performed  and maximum sterile barriers used  Allen's test indicative of satisfactory collateral circulation Attempts: 1 Procedure performed without using ultrasound guided technique. Following insertion, dressing applied and Biopatch. Post procedure assessment: normal and unchanged  Patient tolerated the procedure well with no immediate complications.

## 2019-10-10 NOTE — Discharge Instructions (Signed)

## 2019-10-11 ENCOUNTER — Encounter (HOSPITAL_COMMUNITY): Payer: Self-pay | Admitting: Urology

## 2019-10-11 LAB — GLUCOSE, CAPILLARY
Glucose-Capillary: 253 mg/dL — ABNORMAL HIGH (ref 70–99)
Glucose-Capillary: 242 mg/dL — ABNORMAL HIGH (ref 70–99)
Glucose-Capillary: 235 mg/dL — ABNORMAL HIGH (ref 70–99)
Glucose-Capillary: 265 mg/dL — ABNORMAL HIGH (ref 70–99)
Glucose-Capillary: 186 mg/dL — ABNORMAL HIGH (ref 70–99)

## 2019-10-11 LAB — BASIC METABOLIC PANEL
Anion gap: 13 (ref 5–15)
BUN: 22 mg/dL (ref 8–23)
CO2: 21 mmol/L — ABNORMAL LOW (ref 22–32)
Calcium: 8.6 mg/dL — ABNORMAL LOW (ref 8.9–10.3)
Chloride: 97 mmol/L — ABNORMAL LOW (ref 98–111)
Creatinine, Ser: 1.05 mg/dL (ref 0.61–1.24)
GFR calc Af Amer: >60 mL/min (ref >60)
GFR calc non Af Amer: >60 mL/min (ref >60)
Glucose, Bld: 268 mg/dL — ABNORMAL HIGH (ref 70–99)
Potassium: 3.8 mmol/L (ref 3.5–5.1)
Sodium: 131 mmol/L — ABNORMAL LOW (ref 135–145)

## 2019-10-11 LAB — HEMOGLOBIN AND HEMATOCRIT, BLOOD
HCT: 35.1 % — ABNORMAL LOW (ref 39.0–52.0)
Hemoglobin: 11.8 g/dL — ABNORMAL LOW (ref 13.0–17.0)

## 2019-10-11 MED ORDER — FAMOTIDINE 20 MG PO TABS
40.0000 mg | ORAL_TABLET | Freq: Every day | ORAL | Status: DC
  Administered 2019-10-11: 40 mg via ORAL
  Filled 2019-10-11: qty 2

## 2019-10-11 NOTE — Progress Notes (Signed)
Inpatient Diabetes Program Recommendations  AACE/ADA: New Consensus Statement on Inpatient Glycemic Control (2015)  Target Ranges:  Prepandial:   less than 140 mg/dL      Peak postprandial:   less than 180 mg/dL (1-2 hours)      Critically ill patients:  140 - 180 mg/dL   Lab Results  Component Value Date   GLUCAP 242 (H) 10/11/2019   HGBA1C 9.9 (H) 03/30/2019    Review of Glycemic Control Results for Bertone, Gregory "BEN" (MRN [id number]) as of 10/11/2019 09:40  Ref. Range 10/09/2019 16:07 10/09/2019 20:58 10/10/2019 15:25 10/10/2019 16:52 10/10/2019 22:25 10/11/2019 01:00 10/11/2019 08:03  Glucose-Capillary Latest Ref Range: 70 - 99 mg/dL 149 (H) 162 (H) 347 (H) 371 (H) 291 (H) 253 (H) 242 (H)   Diabetes history: DM 2 Outpatient Diabetes medications: Novolin R 30-100 units tid Current orders for Inpatient glycemic control:  Novolog 0-15 units tid + hs scale + Novolog 3 units tid meal coverage  Inpatient Diabetes Program Recommendations:    Decadron 4 mg given yesterday am during surgery. Glucose trends increased 200-300 range currently 242.   Consider Lantus 8 units Q24 hours for glucose control.   Thanks,  Tama Headings RN, MSN, BC-ADM Inpatient Diabetes Coordinator Team Pager [phone] (8a-5p)

## 2019-10-11 NOTE — Op Note (Signed)
NAMEBARRON, BINNER MEDICAL RECORD [id number] ACCOUNT [id number] DATE OF BIRTH:November 29, 1946 FACILITY: [facility] LOCATION: [facility] PHYSICIAN:Rylee Nuzum, MD  OPERATIVE REPORT  DATE OF PROCEDURE:  10/10/2019  PREOPERATIVE DIAGNOSIS:  Muscle invasive bladder cancer.  PROCEDURE: 1.  Cystoscopy with injection of indocyanine green dye. 2.  Robotic-assisted laparoscopic radical cystoprostatectomy with ileal conduit and diversion, pelvic lymph node dissection.  ASSISTANT:  Debbrah Alar, PA-C  ESTIMATED BLOOD LOSS:  200 mL.  COMPLICATIONS:  None.  SPECIMEN: 1.  Right distal ureteral margin frozen section negative. 2.  Right final distal ureteral margin. 3.  Left distal ureteral margin frozen section negative. 4.  Left final distal ureteral margin. 5.  Right external iliac lymph nodes. 6.  Right obturator lymph nodes. 7.  Right common iliac lymph nodes. 8.  Right internal iliac lymph nodes. 9.  Right common iliac lymph nodes. 10.  Aortic bifurcation lymph nodes. 11.  Left external iliac lymph nodes. 12.  Left obturator lymph nodes. 13.  Left internal iliac lymph nodes. 14.  Left common iliac lymph nodes. 15.  Cystoprostatectomy.  FINDINGS: 1.  Minimal residual intraluminal tumor, only scant likely necrotic tumor at the left lateral bladder wall. 2.  Evidence of prior left inguinal hernia repair with mesh.  No recurrent hernia. 3.  No evidence of sentinel lymph nodes in the pelvis.  DRAINS: 1.  Terrial Rhodes drain to bulb suction. 2.  Right lower quadrant urostomy with right (red), left (blue) bander stents to gravity drainage.  INDICATIONS:  The patient is a pleasant 73 year old man who was found to have muscle invasive bladder cancer that is clinically localized.  He has been on a curative intent pathway with neoadjuvant chemotherapy, followed by cystoprostatectomy with goal  of cure.  He completed his chemotherapy, restaging imaging that continued to reveal no evidence of  distant disease.  Options were discussed including proceeding with curative intent, cystoprostatectomy in line with his multimodal therapy, and he wished  to proceed.  He was admitted yesterday for bowel prep, stomal marking, and labs.  Informed consent was obtained and placed in the medical record.  DESCRIPTION OF PROCEDURE:  The patient being identified, procedure being cysto with injection of indocyanine green dye, and robotic-assisted prostatectomy with conduit diversion was confirmed.  Procedure timeout was performed.  IV antibiotics were  administered.  General endotracheal anesthesia was induced.  The patient was placed into a low lithotomy position.  A sterile field was created by prepping and draping the patient's penis, perineum, and proximal thighs using iodine and his infraxiphoid  abdomen using chlorhexidine gluconate after he was clipper shaved and further fashioning of the operative table using 3-inch tape over foam padding across the supraxiphoid chest.  His arms were tucked to his side with gel rolls.  A test of steep  Trendelenburg positioning was performed, and he was found to be suitably positioned.  Next, cystourethroscopy was performed using a 24-French injection scope sheath.  Inspection of anterior and posterior urethra only revealed some bilobar prostatic  hypertrophy.  Inspection of the urinary bladder revealed some minimal intraluminal, rather necrotic-appearing old tumor in the left lateral trigone area.  Next, 2 mL of indocyanine green dye was injected into mucosal blebs into the area of tumor to aid  in communication and sentinel lymphangiography.  The cystoscope was exchanged for a new silicone-type Foley catheter to straight drain 10 mL around the balloon.  Next, a high-flow, low-pressure pneumoperitoneum was obtained using Veress technique in the  supraumbilical midline, having passed the aspiration and  drop test.  An 8 mm robotic camera port was then placed in location.   Laparoscopic examination of the peritoneal cavity revealed some redundant sigmoid, but no dense adhesions.  Additional ports  were placed as follows:  Right paramedian 8 mm robotic port, right paramedian 15 mm assistant port at the site of the previously marked ostomy, right far lateral 12 mm AirSeal assistant port, left paramedian 8 mm robotic port, left far lateral 8 mm  robotic port was done and passed the electronic checks.  Attention was directed at development of the right retroperitoneum.  Incision was made lateral to the right medial umbilical ligament from the midline towards the area of the internal ring,  coursing along the iliac vessels and superiorly lateral to the ascending colon.  Vas deferens was encountered and purposely ligated.  The ileocecal junction was identified by the presence of the appendix and visualization of the cecum and terminal ileum  junction.  This was traced proximally for a distance of approximately 4 cm, and this area of distal ileum was marked with a silk tag suture at the level of the epiploic fat and a single clip distal to this for proximal distal orientation.  This posterior  peritoneal flap was then used to retract the bowel, and the right ureter was identified as it coursed over the iliac vessels.  It was marked a vessel loop, dissected proximally a distance of approximately 6 cm above the iliac crossing, distally to the  ureterovesical junction.  It was doubly clipped and ligated with the proximal clip being a white tagged suture.  Frozen section negative for carcinoma, and the right bladder wall was swept away from the pelvic sidewall, as was the endopelvic fascia  towards the area of the membranous urethra.  The right ureter was taken out of the true pelvis.  Attention was directed at right-sided lymphadenectomy.  First, the right external iliac group was dissected free with the boundaries being right external  iliac artery, vein, pelvic sidewall, iliac  bifurcation.  Lymphostasis was achieved with cold clips, set aside, labeled right external iliac lymph nodes.  Next, right obturator was dissected free, the boundaries being right external iliac vein, pelvic  sidewall, obturator nerve.  Lymphostasis was achieved with cold clips.  SI labeled right obturator lymph nodes.  The obturator nerve was inspected following maneuvers and found to be uninjured.  Next, right ____ was dissected free with the boundaries  being aortic bifurcation and iliac bifurcation.  Lymphostasis was achieved with cold clips, set aside, labeled right common iliac lymph nodes, and finally, fibrofatty tissue overlying the area of the internal iliac artery from the area of the iliac  bifurcation to the superior vesicle artery was dissected free, set aside, labeled as right internal iliac lymph nodes.  All these areas were inspected during lymphadenectomy, and no evidence of sentinel lymph nodes were seen.  The area of the aortic  bifurcation was also visualized via the right-sided retroperitoneal window, and a representative area of fibrotic tissue overlying the aortic bifurcation was dissected free.  Lymphostasis was achieved with cold clips, set aside, labeled "Aortic  bifurcation lymph nodes."  Attention was directed at left-sided retroperitoneal dissection.  An incision was made lateral to the left medial umbilical ligament from the anterior abdominal wall, coursing along the iliac vessels and lateral to the  ascending colon.  A retroperitoneal flap was created.  The left ureter was identified and marked with a vessel loop, dissected proximally for a distance of approximately  10 cm above the iliac crossing and distally to the ureterovesical junction, which  was doubly clipped and ligated, the proximal clip containing a dyed tag suture.  Frozen section was negative for carcinoma.  The left ureters were tucked out of the true pelvis, and left-sided pelvic total lymphadenectomy was  performed with the exact  same groups and boundaries as per the right.  The left obturator nerve was also inspected following these maneuvers and found to be uninjured throughout the left-sided sentinel lymph nodes.  There was evidence of prior mesh hernia repair, and left  inguinal area was purposely left intact.  This appeared to result in resolution of prior hernia.  The left ureter was then retroperitonealized and brought to the right side via a retroperitoneal window just anterior to the bifurcation behind the colon.   In the right ureter, left ureter, and distal conduit area, tagged sutures were placed into a single laparoscopic clip and tucked out of the true pelvis.  Attention was then directed at posterior dissection.  A posterior flap was created by creating an  inverted U incision connecting the previous lateral peritoneal incisions, and a flap was created to allow dissection posteriorly in a plane just posterior to the vas, seminal vesicles, and prostate towards the area of the apex of the prostate.  This exposed  the vascular pedicles of the bladder and prostate, which were controlled using the white load stapler x3 each side, taking exquisite care to avoid any injury to the rectum, which did not occur.  The space of Retzius was then developed.  Dissection of the  anterior bladder away from the anterior abdominal wall.  This exposed the dorsal venous complex.  It was controlled using a green load stapler.  Final apical dissection was performed in the anterior plane, transecting the membranous urethra coldly,  placing an extra-large Hem-o-Lok clip on the in situ catheter, which was purposely transected.  This completely freed up the cystoprostatectomy specimen.  It was placed into an extra-large EndoCatch bag for later retrieval.  Digital rectal exam was  performed with an indicator glove under laparoscopic vision.  No evidence of rectal violation was noted.  The membranous urethral stump was  oversewn using 3-0 V-Loc to prevent prolonged penile drainage.  Sponge and needle counts were correct.  Hemostasis  appeared excellent.  A closed suction drain was brought through the previous left lateral-most robotic port site into the peritoneal cavity.  The specimen bag tag was brought to the left paramedian robotic port site, and the 3 prior tag sutures were  grasped with a self-locking laparoscopic grasper via the 15 mm assistant port site.  Robot was then undocked.  The patient was made level.  The specimen was retrieved by extending the previous camera port site inferiorly just to the level above the  umbilicus and the [place] wound protector was applied through this.  The right ureter, left ureter, previously marked conduit bowel was brought through this.  All appeared to be of sufficient length and vascularity.  The green load stapler was then used  to take a 14 cm segment of distal ileum of continuity at the previously marked site, and the mesentery was further developed with 2 loads of white load stapler distally and 1 load proximally, taking exquisite care to avoid devascularization of the  anastomotic or conduit segments.  The conduit was then put into retroperitoneal orientation, and bowel-to-bowel anastomosis was performed using 2 fires of the green load stapler.  The free end  was oversewn using running silk with a second imbricating  layer of running silk.  The acute-angled anastomosis was bolstered using interrupted silk.  A mesenteric defect was controlled using interrupted silk x4.  The bowel-to-bowel anastomosis was visibly viable and palpably patent, redelivered into the  abdominal cavity.  The proximal end of the conduit staple line was excluded using running Vicryl.  The distal staple line was excised.  Attention was directed at the ureteroenteric anastomosis.  An approximately 4 mm segment of proximal conduit bowel was  excised, and a V-shaped incision with Potts scissors and 4  mucosal everting sutures of 4-0 Vicryl were applied.  The right ureter was transected distally with a final ureteral margin marked with ink and spatulated for a distance of approximately 6 mm.   A heel stitch was applied with interrupted Vicryl, and a red-colored bander stent was placed.  A 26 cm anastomosis and ureteroenteric anastomosis were further developed using 2 running suture lines of 4-0 Vicryl in a heel-to-toe fashion, which revealed  an excellent mucosal reapproximation.  The red-colored bander stent was then anchored to the level of the mid conduit using a single through-and-through interrupted chromic in an air knot fashion to prevent inadvertent early displacement.  The left  ureter was then anastomosed on the contralateral side of the proximal conduit as per the right using a blue-colored bander stent 26 cm anastomosis.  Next, a quarter-sized column of skin and subcutaneous tissue were excised at the previously marked stomal  site which had become a 15 mm port site at the level of fascia.  The fascia was dilated to accommodate 2 surgeon's fingers, and 4 fascial anchoring sutures were applied of 2-0 Vicryl.  The distal conduit was brought through this and appeared to be of  sufficient length.  It was anchored to the fascial sites x4 to prevent parastomal hernia formation.  Omentum was brought over the area of the extraction site.  It was then closed using interrupted PDS x5, followed by reapproximation of Scarpa's with a  running Vicryl.  All incision sites were infiltrated with dilute lipolyzed Marcaine and closed at the level of skin using subcuticular Monocryl followed by Dermabond.  A drain stitch was applied.  Final maturation of the stoma was performed using 4  rosebudding sutures x4, followed by further reapproximation of the skin to bowel using interrupted Vicryl x2 each quadrant, which revealed an excellent visibly viable rosebudded stoma.  Ostomy appliance was placed and procedure was  terminated.  The  patient tolerated the procedure well.  No immediate complications.  The patient was taken to postanesthesia care unit in stable condition with plan for step-down admission.    Please note first assistant Debbrah Alar was crucial for all portions of the surgery today.  She provided invaluable retraction, vascular clipping, vascular stapling, lymphatic clipping, and general first assistance.  LN/NUANCE  D:10/10/2019 T:10/11/2019 JOB:[id number]

## 2019-10-11 NOTE — Progress Notes (Signed)
1 Day Post-Op Subjective: Patient progressing really well this morning.  He is very conversive and in good spirits.  Reports minimal pain.  Mild nausea, no emesis.  Denies flatus, BM.  Urine output 1.2L Drain 270 Hemoglobin 11.8 Creatinine 1.05  Objective: Vital signs in last 24 hours: Temp:  [97.6 F (36.4 C)-98.6 F (37 C)] 98.6 F (37 C) (10/15 0703) Pulse Rate:  [86-94] 94 (10/15 0600) Resp:  [12-21] 21 (10/15 0600) BP: (111-187)/(62-100) 156/100 (10/15 0600) SpO2:  [93 %-99 %] 95 % (10/15 0600) Arterial Line BP: (135-146)/(57-82) 140/79 (10/14 1615)  Intake/Output from previous day: 10/14 0701 - 10/15 0700 In: 2725.2 [I.V.:2615.1; IV Piggyback:110.1] Out: [id number]; Drains:270; Blood:200] Intake/Output this shift: No intake/output data recorded.  Physical Exam:  General: Alert and oriented. CV: Regular rate Lungs: Normal work of breathing. GI: Soft, Nondistended, appropriately tender. Incisions: Clean, dry, and intact GU: Stoma pink, healthy.  Light red urine in bag.  2 bander stents protruding from stoma in appropriate position.  Lab Results: Recent Labs    10/09/19 1611 10/10/19 1526 10/11/19 0500  HGB 12.8* 12.1* 11.8*  HCT 39.0 36.8* 35.1*      Assessment/Plan: 73 year old male status post robot-assisted cystoprostatectomy for bladder cancer on 10/10/2019.  He has been progressing appropriately postoperatively.  Today will continue current pain regimen, maintain n.p.o., start working with physical therapy and ostomy nursing, transfer to floor.    LOS: 2 days   Haskel Schroeder 10/11/2019, 8:27 AM

## 2019-10-11 NOTE — Progress Notes (Signed)
PT Cancellation Note  Patient Details Name: Gregory Cummings MRN: [id number] DOB: September 15, 1946   Cancelled Treatment:    Reason Eval/Treat Not Completed: Fatigue/lethargy limiting ability to participate(pt reported he was up in recliner a couple hours earlier today and is fatigued now. He politely requested PT attempt tomorrow. Will follow.)  Philomena Doheny PT 10/11/2019  Acute Rehabilitation Services Pager [phone] Office [phone]

## 2019-10-12 ENCOUNTER — Inpatient Hospital Stay (HOSPITAL_COMMUNITY): Payer: Medicare Other

## 2019-10-12 DIAGNOSIS — R9431 Abnormal electrocardiogram [ECG] [EKG]: Secondary | ICD-10-CM

## 2019-10-12 DIAGNOSIS — Z794 Long term (current) use of insulin: Secondary | ICD-10-CM

## 2019-10-12 DIAGNOSIS — K567 Ileus, unspecified: Secondary | ICD-10-CM

## 2019-10-12 DIAGNOSIS — R778 Other specified abnormalities of plasma proteins: Secondary | ICD-10-CM

## 2019-10-12 DIAGNOSIS — E1169 Type 2 diabetes mellitus with other specified complication: Secondary | ICD-10-CM

## 2019-10-12 DIAGNOSIS — C679 Malignant neoplasm of bladder, unspecified: Principal | ICD-10-CM

## 2019-10-12 DIAGNOSIS — E871 Hypo-osmolality and hyponatremia: Secondary | ICD-10-CM

## 2019-10-12 LAB — OSMOLALITY, URINE: Osmolality, Ur: 278 mOsm/kg — ABNORMAL LOW (ref 300–900)

## 2019-10-12 LAB — ECHOCARDIOGRAM COMPLETE
Height: 69 in
Weight: 3472 oz

## 2019-10-12 LAB — BASIC METABOLIC PANEL
Anion gap: 11 (ref 5–15)
Anion gap: 8 (ref 5–15)
BUN: 23 mg/dL (ref 8–23)
BUN: 22 mg/dL (ref 8–23)
CO2: 20 mmol/L — ABNORMAL LOW (ref 22–32)
CO2: 24 mmol/L (ref 22–32)
Calcium: 8.3 mg/dL — ABNORMAL LOW (ref 8.9–10.3)
Calcium: 8.5 mg/dL — ABNORMAL LOW (ref 8.9–10.3)
Chloride: 95 mmol/L — ABNORMAL LOW (ref 98–111)
Chloride: 100 mmol/L (ref 98–111)
Creatinine, Ser: 1.11 mg/dL (ref 0.61–1.24)
Creatinine, Ser: 1.02 mg/dL (ref 0.61–1.24)
GFR calc Af Amer: >60 mL/min (ref >60)
GFR calc Af Amer: >60 mL/min (ref >60)
GFR calc non Af Amer: >60 mL/min (ref >60)
GFR calc non Af Amer: >60 mL/min (ref >60)
Glucose, Bld: 236 mg/dL — ABNORMAL HIGH (ref 70–99)
Glucose, Bld: 182 mg/dL — ABNORMAL HIGH (ref 70–99)
Potassium: 3.9 mmol/L (ref 3.5–5.1)
Potassium: 4.4 mmol/L (ref 3.5–5.1)
Sodium: 126 mmol/L — ABNORMAL LOW (ref 135–145)
Sodium: 132 mmol/L — ABNORMAL LOW (ref 135–145)

## 2019-10-12 LAB — GLUCOSE, CAPILLARY
Glucose-Capillary: 276 mg/dL — ABNORMAL HIGH (ref 70–99)
Glucose-Capillary: 232 mg/dL — ABNORMAL HIGH (ref 70–99)
Glucose-Capillary: 164 mg/dL — ABNORMAL HIGH (ref 70–99)
Glucose-Capillary: 197 mg/dL — ABNORMAL HIGH (ref 70–99)

## 2019-10-12 LAB — OSMOLALITY: Osmolality: 285 mOsm/kg (ref 275–295)

## 2019-10-12 LAB — TROPONIN I (HIGH SENSITIVITY)
Troponin I (High Sensitivity): 61 ng/L — ABNORMAL HIGH (ref <18)
Troponin I (High Sensitivity): 58 ng/L — ABNORMAL HIGH (ref <18)

## 2019-10-12 LAB — HEMOGLOBIN A1C
Hgb A1c MFr Bld: 6.6 % — ABNORMAL HIGH (ref 4.8–5.6)
Mean Plasma Glucose: 142.72 mg/dL

## 2019-10-12 LAB — HEMOGLOBIN AND HEMATOCRIT, BLOOD
HCT: 35.9 % — ABNORMAL LOW (ref 39.0–52.0)
Hemoglobin: 12.2 g/dL — ABNORMAL LOW (ref 13.0–17.0)

## 2019-10-12 LAB — NA AND K (SODIUM & POTASSIUM), RAND UR
Potassium Urine: 10 mmol/L
Sodium, Ur: <10 mmol/L

## 2019-10-12 MED ORDER — ASPIRIN EC 81 MG PO TBEC
81.0000 mg | DELAYED_RELEASE_TABLET | Freq: Every day | ORAL | Status: DC
  Administered 2019-10-12 – 2019-10-15 (×4): 81 mg via ORAL
  Filled 2019-10-12 (×4): qty 1

## 2019-10-12 MED ORDER — ACETAMINOPHEN 10 MG/ML IV SOLN
1000.0000 mg | Freq: Four times a day (QID) | INTRAVENOUS | Status: AC
  Administered 2019-10-12 (×4): 1000 mg via INTRAVENOUS
  Filled 2019-10-12 (×4): qty 100

## 2019-10-12 MED ORDER — INSULIN GLARGINE 100 UNIT/ML ~~LOC~~ SOLN
6.0000 [IU] | Freq: Every day | SUBCUTANEOUS | Status: DC
  Administered 2019-10-12 – 2019-10-14 (×3): 6 [IU] via SUBCUTANEOUS
  Filled 2019-10-12 (×4): qty 0.06

## 2019-10-12 MED ORDER — PERFLUTREN LIPID MICROSPHERE
1.0000 mL | INTRAVENOUS | Status: AC | PRN
  Administered 2019-10-12: 4 mL via INTRAVENOUS
  Filled 2019-10-12: qty 10

## 2019-10-12 MED ORDER — INSULIN ASPART 100 UNIT/ML ~~LOC~~ SOLN
0.0000 [IU] | Freq: Three times a day (TID) | SUBCUTANEOUS | Status: DC
  Administered 2019-10-12 – 2019-10-13 (×3): 4 [IU] via SUBCUTANEOUS
  Administered 2019-10-13: 7 [IU] via SUBCUTANEOUS
  Administered 2019-10-14: 4 [IU] via SUBCUTANEOUS
  Administered 2019-10-14: 11 [IU] via SUBCUTANEOUS
  Administered 2019-10-14: 4 [IU] via SUBCUTANEOUS
  Administered 2019-10-15: 11 [IU] via SUBCUTANEOUS
  Administered 2019-10-15: 4 [IU] via SUBCUTANEOUS

## 2019-10-12 MED ORDER — PANTOPRAZOLE SODIUM 40 MG IV SOLR
40.0000 mg | Freq: Two times a day (BID) | INTRAVENOUS | Status: DC
  Administered 2019-10-12 – 2019-10-13 (×4): 40 mg via INTRAVENOUS
  Filled 2019-10-12 (×5): qty 40

## 2019-10-12 MED ORDER — SODIUM CHLORIDE 0.9 % IV SOLN
INTRAVENOUS | Status: DC
  Administered 2019-10-12 (×2): via INTRAVENOUS

## 2019-10-12 NOTE — Anesthesia Postprocedure Evaluation (Signed)
Anesthesia Post Note  Patient: Gregory Cummings  Procedure(s) Performed: ROBOT ASSISTED LAPAROSCOPIC RADICAL CYSTOPROSTATECTOMY BILATERAL PELVIC LYMPHADENECTOMY,ILEAL CONDUIT (N/A ) CYSTOSCOPY WITH INJECTION (N/A )     Patient location during evaluation: PACU Anesthesia Type: General Level of consciousness: awake and alert Pain management: pain level controlled Vital Signs Assessment: post-procedure vital signs reviewed and stable Respiratory status: spontaneous breathing, nonlabored ventilation, respiratory function stable and patient connected to nasal cannula oxygen Cardiovascular status: blood pressure returned to baseline and stable Postop Assessment: no apparent nausea or vomiting Anesthetic complications: no    Last Vitals:  Vitals:   10/12/19 0700 10/12/19 0800  BP:    Pulse: 95   Resp: 18   Temp:  36.8 C  SpO2: 95%     Last Pain:  Vitals:   10/12/19 0800  TempSrc: Oral  PainSc:                  Kane S

## 2019-10-12 NOTE — Progress Notes (Addendum)
Notified by nurse that centralized tele advised her about elevated ST segment alarm.  I advised to get 12-lead EKG to rule out if it was lead placement change.  12-lead EKG showed possible septal / inferior infarct.  Pt denies any chest pain, but has been nauseated with complaints of abdominal / indigestion / GERD type pain.  Dr. Tresa Moore is primary.  Chaney Malling, NP for [facility] paged at [phone].  Katherin Schorr,NP advised not a [facility] patient.  On-call for urology paged, returned call, talked with primary nurse and gave orders to get Troponion level.  Jacqulyn Ducking ICU/SD RN4 / Care Coordinator / Rapid Response Nurse Rapid Response Number:  [phone] ICU Charge Nurse Number:  [phone]

## 2019-10-12 NOTE — Progress Notes (Signed)
Pharmacy was consulted to review pt's profile for any potential meds that can cause hyponatremia.  Coreg is the only one that has a risk for hyponatremia (1-3%).  However, he was on this at home.  Patient was on 0.45% NS at 125 ml/hr from 10/14 to 10/16 at AM.  Dr. Earnest Conroy suspects low sodium is d/t dilutional effect.  Pharmacy will sign off. Re-consult [place] if need further assistance.  Thank you for asking pharmacy to participate in this pt's care.  Dia Sitter, PharmD, BCPS 10/12/2019 3:09 PM
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
## 2019-10-12 NOTE — Progress Notes (Signed)
Inpatient Diabetes Program Recommendations  AACE/ADA: New Consensus Statement on Inpatient Glycemic Control (2015)  Target Ranges:  Prepandial:   less than 140 mg/dL      Peak postprandial:   less than 180 mg/dL (1-2 hours)      Critically ill patients:  140 - 180 mg/dL   Lab Results  Component Value Date   GLUCAP 276 (H) 10/12/2019   HGBA1C 9.9 (H) 03/30/2019    Review of Glycemic Control Results for [place], Pancho "BEN" (MRN [id number]) as of 10/12/2019 10:46  Ref. Range 10/11/2019 08:03 10/11/2019 11:28 10/11/2019 16:21 10/11/2019 21:30 10/12/2019 08:15  Glucose-Capillary Latest Ref Range: 70 - 99 mg/dL 242 (H) 235 (H) 265 (H) 186 (H) 276 (H)    Diabetes history: DM 2 Outpatient Diabetes medications: Novolin R 30-100 units tid Current orders for Inpatient glycemic control:  Novolog 0-15 units tid + hs scale + Novolog 3 units tid meal coverage  Inpatient Diabetes Program Recommendations:    Decadron 4 mg given 10/14 during surgery. Glucose trends increased 200-300 range.   Consider Lantus 8 units Q24 hours for glucose control.   Thanks,  Tama Headings RN, MSN, BC-ADM Inpatient Diabetes Coordinator Team Pager [phone] (8a-5p)

## 2019-10-12 NOTE — Progress Notes (Signed)
  Echocardiogram 2D Echocardiogram has been performed.  [place] Deputy Demarie Hyneman 10/12/2019, 12:41 PM

## 2019-10-12 NOTE — Consult Note (Addendum)
[place] Nurse ostomy consult note Stoma type/location: Urostomy performed 10/14 Stomal assessment/size: Stoma is red and viable, above skin level, 1 1/4 inches; 2 stints in place Peristomal assessment: intact skin surrounding, current pouch is leaking behind the barrier Output: mod amt pink urine  Ostomy pouching: 1pc.  Education provided:  Demonstrated pouch change using barrier ring to attempt to maintain a seal and one piece ursostomy pouch.  Pt watched the process in a mirror and was able to open and close the spout to empty, and attach and remove from bedside drainage bag. Discussed ordering supplies and pouching routines. Pt asked appropriate questions and educational materials and extra supplies were left at the bedside.  Enrolled patient in [place] program: Yes [place] team will perform another teaching session on Monday.  Pt could benefit from home health assistance after discharge.  Julien Girt MSN, RN, [place], [place], [place]

## 2019-10-12 NOTE — Progress Notes (Addendum)
Spoke with Nurse Wynetta Emery. Pt POD#2 cystectomy. Some nausea, no CP or SOB. Slightly hypertensive. Discussed with Cardiology [facility] Cun[place]) - EKGs look similar - new and 2016. Also RBBB reported from cardiologist at [facility] (on careverywhere). Monitor patient for hemodynamic changes and troponin. Check ECHO (ordered).   Add: Spoke to Nurse Leah - pt with nausea (stable) and no CP. Trop 19 - spoke with cards- follow trend. Trop < 18-99 reassuring per protocol.

## 2019-10-12 NOTE — Evaluation (Signed)
Physical Therapy Evaluation Patient Details Name: Gregory Cummings MRN: [id number] DOB: 07/06/1946 Today's Date: 10/12/2019   History of Present Illness  73 y.o. male admitted with bladder cancer, s/p cystoprostatectomy 10/10/19. PMH of IDDM, CAD, hernia repair.  Clinical Impression  Pt admitted as above and presenting with functional mobility limitations 2* post op discomfort and ambulatory balance deficits.  Pt should progress well to dc home with family assist.    Follow Up Recommendations No PT follow up    Equipment Recommendations  None recommended by PT    Recommendations for Other Services       Precautions / Restrictions Precautions Precautions: Fall Precaution Comments: JP drain on L Restrictions Weight Bearing Restrictions: No      Mobility  Bed Mobility Overal bed mobility: Needs Assistance Bed Mobility: Supine to Sit;Sit to Supine     Supine to sit: Min guard Sit to supine: Min guard   General bed mobility comments: cues for log roll technique and use of bed rail to self assist  Transfers Overall transfer level: Needs assistance Equipment used: Rolling walker (2 wheeled) Transfers: Sit to/from Stand Sit to Stand: Min assist         General transfer comment: steady assist   Ambulation/Gait Ambulation/Gait assistance: Min assist;Min guard Gait Distance (Feet): 800 Feet Assistive device: Rolling walker (2 wheeled) Gait Pattern/deviations: Step-through pattern;Decreased step length - right;Decreased step length - left;Shuffle;Trunk flexed Gait velocity: mod pace   General Gait Details: cues for posture and position from RW; min assist to steady and manage RW  Stairs            Wheelchair Mobility    Modified Rankin (Stroke Patients Only)       Balance Overall balance assessment: Needs assistance Sitting-balance support: No upper extremity supported;Feet supported Sitting balance-Leahy Scale: Good     Standing balance support:  Bilateral upper extremity supported Standing balance-Leahy Scale: Fair                               Pertinent Vitals/Pain Pain Assessment: 0-10 Pain Score: 2  Pain Location: abdomen Pain Descriptors / Indicators: Sore Pain Intervention(s): Limited activity within patient's tolerance;Monitored during session;Premedicated before session    Home Living Family/patient expects to be discharged to:: Private residence Living Arrangements: Spouse/significant other Available Help at Discharge: Family Type of Home: House Home Access: Stairs to enter Entrance Stairs-Rails: None Technical brewer of Steps: 7 Home Layout: Two level Home Equipment: Environmental consultant - 2 wheels      Prior Function Level of Independence: Independent               Hand Dominance        Extremity/Trunk Assessment   Upper Extremity Assessment Upper Extremity Assessment: Overall WFL for tasks assessed    Lower Extremity Assessment Lower Extremity Assessment: Overall WFL for tasks assessed       Communication   Communication: No difficulties  Cognition Arousal/Alertness: Awake/alert Behavior During Therapy: WFL for tasks assessed/performed Overall Cognitive Status: Within Functional Limits for tasks assessed                                        General Comments      Exercises General Exercises - Lower Extremity Ankle Circles/Pumps: AROM;Both;15 reps;Supine   Assessment/Plan    PT Assessment Patient needs continued PT services  PT  Problem List Decreased activity tolerance;Decreased balance;Decreased mobility;Pain;Decreased knowledge of use of DME       PT Treatment Interventions DME instruction;Gait training;Stair training;Functional mobility training;Therapeutic activities;Therapeutic exercise;Patient/family education    PT Goals (Current goals can be found in the Care Plan section)  Acute Rehab PT Goals Patient Stated Goal: Regain IND PT Goal  Formulation: With patient Time For Goal Achievement: 10/26/19 Potential to Achieve Goals: Good    Frequency Min 3X/week   Barriers to discharge        Co-evaluation               AM-PAC PT "6 Clicks" Mobility  Outcome Measure Help needed turning from your back to your side while in a flat bed without using bedrails?: A Little Help needed moving from lying on your back to sitting on the side of a flat bed without using bedrails?: A Little Help needed moving to and from a bed to a chair (including a wheelchair)?: A Little Help needed standing up from a chair using your arms (e.g., wheelchair or bedside chair)?: A Little Help needed to walk in hospital room?: A Little Help needed climbing 3-5 steps with a railing? : A Lot 6 Click Score: 17    End of Session Equipment Utilized During Treatment: Gait belt Activity Tolerance: Patient tolerated treatment well Patient left: in bed;with call bell/phone within reach Nurse Communication: Mobility status PT Visit Diagnosis: Difficulty in walking, not elsewhere classified (R26.2)    Time: [id number] PT Time Calculation (min) (ACUTE ONLY): 25 min   Charges:   PT Evaluation $PT Eval Low Complexity: 1 Low PT Treatments $Gait Training: 8-22 mins        [place] Pager [phone] Office [phone]   Shuntae Herzig 10/12/2019, 12:33 PM

## 2019-10-12 NOTE — Progress Notes (Addendum)
2 Days Post-Op Subjective: Eventful night. ST elevation noted on tele; EKG confirmed possible infarct. Trops elevated to 61. Cardiology recommended echo, which was ordered. He denies CP, SOB. Does endorse nausea and GERD; has reflux at baseline. Denies flatus, BM. States pain well controlled.   Urine output 1.2L Drain 545 Na 126 - hyponatremia Cr 1.1  DM RN following for diabetes management/glycemic control.   Objective: Vital signs in last 24 hours: Temp:  [98 F (36.7 C)-98.9 F (37.2 C)] 98.2 F (36.8 C) (10/16 0800) Pulse Rate:  [77-103] 96 (10/16 1000) Resp:  [14-26] 20 (10/16 1000) BP: (115-187)/(71-120) 149/89 (10/16 1000) SpO2:  [93 %-97 %] 97 % (10/16 1000)  Intake/Output from previous day: 10/15 0701 - 10/16 0700 In: 139.5 [I.V.:10; IV Piggyback:129.5] Out: 1695 [Urine:1150; Drains:545] Intake/Output this shift: Total I/O In: 182.2 [I.V.:94.8; IV Piggyback:87.4] Out: 135 [Drains:135]  Physical Exam:  General: Alert and oriented. CV: Regular rate Lungs: Normal work of breathing. GI: Soft, Nondistended, appropriately tender. Incisions: Clean, dry, and intact GU: Stoma pink, healthy.  Red tinged urine in bag.  2 bander stents protruding from stoma in appropriate position.  Lab Results: Recent Labs    10/10/19 1526 10/11/19 0500 10/12/19 0213  HGB 12.1* 11.8* 12.2*  HCT 36.8* 35.1* 35.9*      Assessment/Plan: 73 year old male status post robot-assisted cystoprostatectomy for bladder cancer on 10/10/2019. On POD2, concern for ST elevation and elevated trops, echo ordered. Medicine consulted for ST changes and hyponatremia as well. Pain well controlled. Will hold off on advancing diet given nausea and belching. Physical therapy and ostomy nurse consults in place and pending. Transferred back to stepdown status given overnight events.     LOS: 3 days   Haskel Schroeder 10/12/2019, 12:28 PM   I have seen and examined the patient and agree with Dr.  Crissie Figures plan.  Briefly,  S: 1 - Bladder Cancer - s/p robotic cystoprostatectomy with lymphadenectomy and conduit diversion 10/10/19, admitted for bowel prep prior. Path pending.  2 - Post-Op Ileus - bowel anastamosis as part of conduit diversion 10/14. Received entereg peri-op. NPO initially post,op. Advanced to ice chips POD 1, Clears POD 2 as some flatus and small BM.  3 - Disposition / Rehab - independent in all ADL's at baseline. Walking post-op. PT eval recs no further PT needs as of POD 2. Will need HHRN at DC to reinforce new ostomy teaching / supplies.  4 - ST Changes, Hyponatremia, Diabetes - pt with ST changes and mild trop I leak early AM 10/16. EKG stable, Echo favorable. Restarted Asa 10/16. Internal medicine consult 10/16 to help with medical comanagenet of this and his impressive diabetes that is refracotry to sliding scale alone.   Today "Gregory Cummings" states he is feelign better than prior. NO nauseas / emesis. Walking with PT.   O: NAD, AOx3, significant other at bedside in stepdown HR 80s sinus by monitor Non-labored breathing on [place] O2 Abd soft, no r/g, normal gas distribution, veyr mild distension (close to baseline) RLQ Urostomy pink / patent with Rt (red) and Lt (blue) bander stents and copious non-foul urine LLQ JP with non-foul serous fluid SCD's in place w/o c/c/e  EKG, Echo reviewed BMP reviewd  A/P. Change IVF to NS at lower rate, adv to clears, remain stepdown.  GREATLY appreciate medical service input and comanagment.  Reiterated goals of further hospital course and on call MD's on the weekend.

## 2019-10-13 DIAGNOSIS — E119 Type 2 diabetes mellitus without complications: Secondary | ICD-10-CM

## 2019-10-13 DIAGNOSIS — C670 Malignant neoplasm of trigone of bladder: Secondary | ICD-10-CM

## 2019-10-13 DIAGNOSIS — E871 Hypo-osmolality and hyponatremia: Secondary | ICD-10-CM

## 2019-10-13 DIAGNOSIS — I2510 Atherosclerotic heart disease of native coronary artery without angina pectoris: Secondary | ICD-10-CM

## 2019-10-13 LAB — GLUCOSE, CAPILLARY
Glucose-Capillary: 176 mg/dL — ABNORMAL HIGH (ref 70–99)
Glucose-Capillary: 176 mg/dL — ABNORMAL HIGH (ref 70–99)
Glucose-Capillary: 249 mg/dL — ABNORMAL HIGH (ref 70–99)
Glucose-Capillary: 166 mg/dL — ABNORMAL HIGH (ref 70–99)

## 2019-10-13 LAB — BASIC METABOLIC PANEL
Anion gap: 7 (ref 5–15)
BUN: 22 mg/dL (ref 8–23)
CO2: 24 mmol/L (ref 22–32)
Calcium: 7.9 mg/dL — ABNORMAL LOW (ref 8.9–10.3)
Chloride: 102 mmol/L (ref 98–111)
Creatinine, Ser: 0.94 mg/dL (ref 0.61–1.24)
GFR calc Af Amer: >60 mL/min (ref >60)
GFR calc non Af Amer: >60 mL/min (ref >60)
Glucose, Bld: 182 mg/dL — ABNORMAL HIGH (ref 70–99)
Potassium: 3.7 mmol/L (ref 3.5–5.1)
Sodium: 133 mmol/L — ABNORMAL LOW (ref 135–145)

## 2019-10-13 LAB — HEMOGLOBIN AND HEMATOCRIT, BLOOD
HCT: 31.2 % — ABNORMAL LOW (ref 39.0–52.0)
Hemoglobin: 10.3 g/dL — ABNORMAL LOW (ref 13.0–17.0)

## 2019-10-13 MED ORDER — IPRATROPIUM-ALBUTEROL 0.5-2.5 (3) MG/3ML IN SOLN: 3.0000 mL | RESPIRATORY_TRACT | Status: DC | PRN

## 2019-10-13 MED ORDER — POTASSIUM CHLORIDE 10 MEQ/100ML IV SOLN
10.0000 meq | INTRAVENOUS | Status: AC
  Administered 2019-10-13 (×3): 10 meq via INTRAVENOUS
  Filled 2019-10-13 (×3): qty 100

## 2019-10-13 NOTE — Progress Notes (Signed)
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  Medicine team consulted for possible concerns of ST elevations on telemetry without any chest pain.  Cardiology curbside by urology, nonconcerning.  Also complicated by hyponatremia and abdominal discomfort.   Assessment & Plan:   Principal Problem:   Bladder cancer ([place]) Active Problems:   Malignant neoplasm of urinary bladder (HCC)   Hyponatremia   CAD (coronary artery disease)   DM2 (diabetes mellitus, type 2) (HCC)   Hyponatremia, slowly improving -Combination of intravascular dehydration/hypotonic fluids and pseudohyponatremia in the setting of hyperglycemia -Now on normal saline 75 cc/h. -Closely monitor sodium levels. -Urine electrolytes-urine sodium less than 10  Refractory nausea and abdominal distention Ileus -Abdominal x-ray consistent with gaseous distention and ileus -Out of bed to chair.  Mobilize as much as possible. -Keep potassium as close to 4.0 as possible. -If nausea vomiting persist, place NG tube  Invasive bladder cancer -Status post surgery.  Management per urology service.  Abnormal changes on telemetry/ST elevation -Chest pain-free.  Troponin pretty much unremarkable. -EKG normal sinus rhythm. -Echocardiogram-  History of CAD status post PCI -Resume aspirin when cleared by urology -Resume Coreg, lisinopril on hold  Diabetes mellitus type 2, insulin-dependent -Hemoglobin A1c 6.6 -Lantus 6 units daily.  Insulin sliding scale Accu-Chek.  GERD -Cont PPI   DVT prophylaxis:  Per urology Code Status: Full code Family Communication: None at bedside Disposition Plan: Okay to transfer to telemetry floor from medicine standpoint  Subjective: Feels much better.  Denies any nausea vomiting.  Only abdominal tenderness at the surgical site otherwise no complaints  Review of Systems Otherwise negative except as per HPI, including: General: Denies fever, chills, night sweats or unintended weight loss. Resp: Denies cough, wheezing, shortness of breath. Cardiac: Denies chest pain, palpitations, orthopnea, paroxysmal nocturnal dyspnea. GI: Denies abdominal pain, nausea, vomiting, diarrhea or constipation GU: Denies dysuria, frequency, hesitancy or incontinence MS: Denies muscle aches, joint pain or swelling Neuro: Denies headache, neurologic deficits (focal weakness, numbness, tingling), abnormal gait Psych: Denies anxiety, depression, SI/HI/AVH Skin: Denies new rashes or lesions ID: Denies sick contacts, exotic exposures, travel  Objective: Vitals:   10/13/19 0400 10/13/19 0411 10/13/19 0500 10/13/19 0600  BP: 117/68  139/77 126/71  Pulse: 85  78 82  Resp: 16  17 18   Temp:  97.9 F (36.6 C)    TempSrc:  Oral    SpO2: 90%  94% 91%  Weight:      Height:        Intake/Output Summary (Last 24 hours) at 10/13/2019 0732 Last data filed at 10/13/2019 [id number] Gross per 24 hour  Intake 1245.3 ml  Output 3080 ml  Net -1834.7 ml   Filed Weights   10/09/19 1500  Weight: 98.4 kg    Examination:  General exam: Appears calm and comfortable  Respiratory system: Clear to auscultation. Respiratory effort normal. Cardiovascular system: S1 & S2 heard, RRR. No JVD, murmurs, rubs, gallops or clicks. No pedal edema. Gastrointestinal system: Surgical site on the abdomen noted Evidence of bleeding.  Positive bowel sounds Central nervous system: Alert and oriented. No focal neurological deficits. Extremities: Symmetric 5 x 5  power. Skin: Surgical dressing on the abdomen  noted with urostomy bag. Psychiatry: Judgement and insight appear normal. Mood & affect appropriate.   Right upper quadrant urostomy Urethral drain noted  Data Reviewed:   CBC: Recent Labs  Lab 10/09/19 1611 10/10/19 1526 10/11/19 0500 10/12/19 0213 10/13/19 0411  WBC 4.6  --   --   --   --   HGB 12.8* 12.1* 11.8* 12.2* 10.3*  HCT 39.0 36.8* 35.1* 35.9* 31.2*  MCV 100.8*  --   --   --   --   PLT 213  --   --   --   --    Basic Metabolic Panel: Recent Labs  Lab 10/09/19 1611 10/11/19 0500 10/12/19 0213 10/12/19 1655 10/13/19 0411  NA 135 131* 126* 132* 133*  K 3.7 3.8 3.9 4.4 3.7  CL 102 97* 95* 100 102  CO2 23 21* 20* 24 24  GLUCOSE 154* 268* 236* 182* 182*  BUN 18 22 23 22 22   CREATININE 0.85 1.05 1.11 1.02 0.94  CALCIUM 9.0 8.6* 8.3* 8.5* 7.9*   GFR: Estimated Creatinine Clearance: 79.8 mL/min (by C-G formula based on SCr of 0.94 mg/dL). Liver Function Tests: Recent Labs  Lab 10/09/19 1611  AST 25  ALT 23  ALKPHOS 46  BILITOT 1.4*  PROT 6.9  ALBUMIN 3.7   No results for input(s): LIPASE, AMYLASE in the last 168 hours. No results for input(s): AMMONIA in the last 168 hours. Coagulation Profile: No results for input(s): INR, PROTIME in the last 168 hours. Cardiac Enzymes: No results for input(s): CKTOTAL, CKMB, CKMBINDEX, TROPONINI in the last 168 hours. BNP (last 3 results) No results for input(s): PROBNP in the last 8760 hours. HbA1C: Recent Labs    10/12/19 1655  HGBA1C 6.6*   CBG: Recent Labs  Lab 10/11/19 2130 10/12/19 0815 10/12/19 1224 10/12/19 1643 10/12/19 2116  GLUCAP 186* 276* 232* 164* 197*   Lipid Profile: No results for input(s): CHOL, HDL, LDLCALC, TRIG, CHOLHDL, LDLDIRECT in the last 72 hours. Thyroid Function Tests: No results for input(s): TSH, T4TOTAL, FREET4, T3FREE, THYROIDAB in the last 72 hours. Anemia Panel: No results for input(s): VITAMINB12, FOLATE, FERRITIN, TIBC, IRON, RETICCTPCT in the last 72 hours.  Sepsis Labs: No results for input(s): PROCALCITON, LATICACIDVEN in the last 168 hours.  Recent Results (from the past 240 hour(s))  SARS Coronavirus 2 by RT PCR (hospital order, performed in [facility] hospital lab) Nasopharyngeal Nasopharyngeal Swab     Status: None   Collection Time: 10/09/19  3:13 PM   Specimen: Nasopharyngeal Swab  Result Value Ref Range Status   SARS Coronavirus 2 NEGATIVE NEGATIVE Final    Comment: (NOTE) If result is NEGATIVE SARS-CoV-2 target nucleic acids are NOT DETECTED. The SARS-CoV-2 RNA is generally detectable in upper and lower  respiratory specimens during the acute phase of infection. The lowest  concentration of SARS-CoV-2 viral copies this assay can detect is 250  copies / mL. A negative result does not preclude SARS-CoV-2 infection  and should not be used as the sole basis for treatment or other  patient management decisions.  A negative result may occur with  improper specimen collection / handling, submission of specimen other  than nasopharyngeal swab, presence of viral mutation(s) within the  areas targeted by this assay, and inadequate number of viral copies  (<250 copies / mL). A negative result must be combined with clinical  observations, patient history, and epidemiological information. If result is POSITIVE SARS-CoV-2 target nucleic  acids are DETECTED. The SARS-CoV-2 RNA is generally detectable in upper and lower  respiratory specimens dur ing the acute phase of infection.  Positive  results are indicative of active infection with SARS-CoV-2.  Clinical  correlation with patient history and other diagnostic information is  necessary to determine patient infection status.  Positive results do  not rule out bacterial infection or co-infection with other viruses. If result is PRESUMPTIVE POSTIVE SARS-CoV-2 nucleic acids MAY BE PRESENT.   A presumptive positive result was obtained on the submitted specimen  and confirmed on repeat  testing.  While 2019 novel coronavirus  (SARS-CoV-2) nucleic acids may be present in the submitted sample  additional confirmatory testing may be necessary for epidemiological  and / or clinical management purposes  to differentiate between  SARS-CoV-2 and other Sarbecovirus currently known to infect humans.  If clinically indicated additional testing with an alternate test  methodology [phone]) is advised. The SARS-CoV-2 RNA is generally  detectable in upper and lower respiratory sp ecimens during the acute  phase of infection. The expected result is Negative. Fact Sheet for Patients:  StrictlyIdeas.no Fact Sheet for Healthcare Providers: BankingDealers.co.za This test is not yet approved or cleared by the [place] FDA and has been authorized for detection and/or diagnosis of SARS-CoV-2 by FDA under an Emergency Use Authorization (EUA).  This EUA will remain in effect (meaning this test can be used) for the duration of the COVID-19 declaration under Section 564(b)(1) of the Act, 21 U.S.C. section 360bbb-3(b)(1), unless the authorization is terminated or revoked sooner. Performed at [facility], [place] [address] [place], [place] [address]   MRSA PCR Screening     Status: None   Collection Time: 10/09/19  3:13 PM   Specimen: Nasopharyngeal  Result Value Ref Range Status   MRSA by PCR NEGATIVE NEGATIVE Final    Comment:        The GeneXpert MRSA Assay (FDA approved for NASAL specimens only), is one component of a comprehensive MRSA colonization surveillance program. It is not intended to diagnose MRSA infection nor to guide or monitor treatment for MRSA infections. Performed at [facility], [place] [address], [place], [place] [address]          Radiology Studies: Dg Abd 1 View  Result Date: 10/12/2019 CLINICAL DATA:  Ileus.  Status post cystoprostatectomy. EXAM: ABDOMEN - 1 VIEW  COMPARISON:  08/28/2019 CT FINDINGS: Gaseous distension of small bowel and colon identified with gas in the rectum. Drain overlying the pelvis and bilateral ureteral stents noted. No suspicious calcifications are identified. IMPRESSION: Gaseous distension of small bowel and colon compatible with ileus. Electronically Signed   By: Margarette Canada M.D.   On: 10/12/2019 15:06        Scheduled Meds: . aspirin EC  81 mg Oral Daily  . carvedilol  3.125 mg Oral Daily  . Chlorhexidine Gluconate Cloth  6 each Topical Daily  . heparin lock flush  500 Units Intracatheter Q30 days  . insulin aspart  0-20 Units Subcutaneous TID WC  . insulin aspart  0-5 Units Subcutaneous QHS  . insulin glargine  6 Units Subcutaneous Daily  . mouth rinse  15 mL Mouth Rinse BID  . pantoprazole (PROTONIX) IV  40 mg Intravenous Q12H  . simvastatin  20 mg Oral QHS  . sodium chloride flush  10-40 mL Intracatheter Q12H   Continuous Infusions: . sodium chloride 75 mL/hr at 10/13/19 0004     LOS: 4 days  Time spent= 25 mins    Tymon Nemetz Arsenio Loader, MD [facility]  If 7PM-7AM, please contact night-coverage  10/13/2019, 7:32 AM

## 2019-10-13 NOTE — Progress Notes (Signed)
Called By Dr Earnest Conroy to check on patient after abd xray showed probable ileus. Pt sitting up comfortably in bed at time of assessment. Denis any abdominal pain/discomfort, N/V, SOB. No further interventions at this time. Will continue to monitor closely.  Lovey Newcomer, NP [facility] 7p-7a [phone]

## 2019-10-13 NOTE — Progress Notes (Signed)
Physical Therapy Treatment Patient Details Name: Gregory Cummings MRN: [id number] DOB: 26-Feb-1946 Today's Date: 10/13/2019    History of Present Illness 73 y.o. male admitted with bladder cancer, s/p cystoprostatectomy 10/10/19. PMH of IDDM, CAD, hernia repair.    PT Comments    Pt very cooperative and with noted improvement in ambulatory stability and activity tolerance this am.   Follow Up Recommendations  No PT follow up     Equipment Recommendations  None recommended by PT    Recommendations for Other Services       Precautions / Restrictions Precautions Precautions: Fall Precaution Comments: JP drain on L Restrictions Weight Bearing Restrictions: No    Mobility  Bed Mobility Overal bed mobility: Needs Assistance Bed Mobility: Supine to Sit     Supine to sit: Min guard     General bed mobility comments: cues for log roll technique and use of bed rail to self assist  Transfers Overall transfer level: Needs assistance Equipment used: Rolling walker (2 wheeled) Transfers: Sit to/from Stand Sit to Stand: Min assist;Min guard         General transfer comment: steady assist   Ambulation/Gait Ambulation/Gait assistance: Min guard Gait Distance (Feet): 1200 Feet Assistive device: Rolling walker (2 wheeled) Gait Pattern/deviations: Step-through pattern;Decreased step length - right;Decreased step length - left;Shuffle;Trunk flexed Gait velocity: mod pace   General Gait Details: cues for posture and position from RW; no assist to manage RW this date and noted decreased effort of breathing   Stairs             Wheelchair Mobility    Modified Rankin (Stroke Patients Only)       Balance Overall balance assessment: Needs assistance Sitting-balance support: No upper extremity supported;Feet supported Sitting balance-Leahy Scale: Good     Standing balance support: Bilateral upper extremity supported Standing balance-Leahy Scale: Fair                              Cognition Arousal/Alertness: Awake/alert Behavior During Therapy: WFL for tasks assessed/performed Overall Cognitive Status: Within Functional Limits for tasks assessed                                        Exercises      General Comments        Pertinent Vitals/Pain Pain Assessment: 0-10 Pain Score: 2  Pain Location: abdomen Pain Descriptors / Indicators: Sore Pain Intervention(s): Limited activity within patient's tolerance;Monitored during session    Home Living                      Prior Function            PT Goals (current goals can now be found in the care plan section) Acute Rehab PT Goals Patient Stated Goal: Regain IND PT Goal Formulation: With patient Time For Goal Achievement: 10/26/19 Potential to Achieve Goals: Good Progress towards PT goals: Progressing toward goals    Frequency    Min 3X/week      PT Plan Current plan remains appropriate    Co-evaluation              AM-PAC PT "6 Clicks" Mobility   Outcome Measure  Help needed turning from your back to your side while in a flat bed without using bedrails?: A Little Help needed moving from lying  on your back to sitting on the side of a flat bed without using bedrails?: A Little Help needed moving to and from a bed to a chair (including a wheelchair)?: A Little Help needed standing up from a chair using your arms (e.g., wheelchair or bedside chair)?: A Little Help needed to walk in hospital room?: A Little Help needed climbing 3-5 steps with a railing? : A Lot 6 Click Score: 17    End of Session Equipment Utilized During Treatment: Gait belt Activity Tolerance: Patient tolerated treatment well Patient left: in chair;with call bell/phone within reach;with chair alarm set;with nursing/sitter in room Nurse Communication: Mobility status PT Visit Diagnosis: Difficulty in walking, not elsewhere classified (R26.2)     Time:  [id number] PT Time Calculation (min) (ACUTE ONLY): 24 min  Charges:  $Gait Training: 23-37 mins                     Anon Raices Pager [phone] Office [phone]    Doloros Kwolek 10/13/2019, 3:51 PM

## 2019-10-13 NOTE — Progress Notes (Signed)
3 Days Post-Op Subjective: Patient reports Passing flatus and had a bowel movement.  Tolerating p.o..  On  Clear liquids.  No chest pain or shortness of breath.  Objective: Vital signs in last 24 hours: Temp:  [97.9 F (36.6 C)-99 F (37.2 C)] 98 F (36.7 C) (10/17 1200) Pulse Rate:  [70-92] 70 (10/17 1000) Resp:  [14-22] 18 (10/17 1000) BP: (88-158)/(49-93) 146/83 (10/17 1000) SpO2:  [90 %-96 %] 95 % (10/17 1000)  Intake/Output from previous day: 10/16 0701 - 10/17 0700 In: 1245.3 [P.O.:20; I.V.:964.1; IV Piggyback:261.2] Out: 3080 [Urine:2425; Drains:655] Intake/Output this shift: Total I/O In: 893.5 [I.V.:657.1; IV Piggyback:236.3] Out: 140 [Drains:140]  Physical Exam:    He is watching TV in bed  no  focalneurologic deficits on exam-moving all extremities  cardiovascular-regular rate and rhythm  pulmonary -clear to auscultation bilaterally  abdomen -soft and nontender, good bowel sounds, incision clean dry and intact, pink viable stoma  extremities-no calf pain or swelling  Lab Results: Recent Labs    10/11/19 0500 10/12/19 0213 10/13/19 0411  HGB 11.8* 12.2* 10.3*  HCT 35.1* 35.9* 31.2*   BMET Recent Labs    10/12/19 1655 10/13/19 0411  NA 132* 133*  K 4.4 3.7  CL 100 102  CO2 24 24  GLUCOSE 182* 182*  BUN 22 22  CREATININE 1.02 0.94  CALCIUM 8.5* 7.9*   No results for input(s): LABPT, INR in the last 72 hours. No results for input(s): LABURIN in the last 72 hours. Results for orders placed or performed during the hospital encounter of 10/09/19  SARS Coronavirus 2 by RT PCR (hospital order, performed in [facility] hospital lab) Nasopharyngeal Nasopharyngeal Swab     Status: None   Collection Time: 10/09/19  3:13 PM   Specimen: Nasopharyngeal Swab  Result Value Ref Range Status   SARS Coronavirus 2 NEGATIVE NEGATIVE Final    Comment: (NOTE) If result is NEGATIVE SARS-CoV-2 target nucleic acids are NOT DETECTED. The SARS-CoV-2 RNA is generally  detectable in upper and lower  respiratory specimens during the acute phase of infection. The lowest  concentration of SARS-CoV-2 viral copies this assay can detect is 250  copies / mL. A negative result does not preclude SARS-CoV-2 infection  and should not be used as the sole basis for treatment or other  patient management decisions.  A negative result may occur with  improper specimen collection / handling, submission of specimen other  than nasopharyngeal swab, presence of viral mutation(s) within the  areas targeted by this assay, and inadequate number of viral copies  (<250 copies / mL). A negative result must be combined with clinical  observations, patient history, and epidemiological information. If result is POSITIVE SARS-CoV-2 target nucleic acids are DETECTED. The SARS-CoV-2 RNA is generally detectable in upper and lower  respiratory specimens dur ing the acute phase of infection.  Positive  results are indicative of active infection with SARS-CoV-2.  Clinical  correlation with patient history and other diagnostic information is  necessary to determine patient infection status.  Positive results do  not rule out bacterial infection or co-infection with other viruses. If result is PRESUMPTIVE POSTIVE SARS-CoV-2 nucleic acids MAY BE PRESENT.   A presumptive positive result was obtained on the submitted specimen  and confirmed on repeat testing.  While 2019 novel coronavirus  (SARS-CoV-2) nucleic acids may be present in the submitted sample  additional confirmatory testing may be necessary for epidemiological  and / or clinical management purposes  to differentiate between  SARS-CoV-2 and other Sarbecovirus currently known to infect humans.  If clinically indicated additional testing with an alternate test  methodology [phone]) is advised. The SARS-CoV-2 RNA is generally  detectable in upper and lower respiratory sp ecimens during the acute  phase of infection. The  expected result is Negative. Fact Sheet for Patients:  StrictlyIdeas.no Fact Sheet for Healthcare Providers: BankingDealers.co.za This test is not yet approved or cleared by the [place] FDA and has been authorized for detection and/or diagnosis of SARS-CoV-2 by FDA under an Emergency Use Authorization (EUA).  This EUA will remain in effect (meaning this test can be used) for the duration of the COVID-19 declaration under Section 564(b)(1) of the Act, 21 U.S.C. section 360bbb-3(b)(1), unless the authorization is terminated or revoked sooner. Performed at [facility], [place] [address], [place], [place] [address]   MRSA PCR Screening     Status: None   Collection Time: 10/09/19  3:13 PM   Specimen: Nasopharyngeal  Result Value Ref Range Status   MRSA by PCR NEGATIVE NEGATIVE Final    Comment:        The GeneXpert MRSA Assay (FDA approved for NASAL specimens only), is one component of a comprehensive MRSA colonization surveillance program. It is not intended to diagnose MRSA infection nor to guide or monitor treatment for MRSA infections. Performed at [facility], [place] [address] [place], [place] [address]     Studies/Results: Dg Abd 1 View  Result Date: 10/12/2019 CLINICAL DATA:  Ileus.  Status post cystoprostatectomy. EXAM: ABDOMEN - 1 VIEW COMPARISON:  08/28/2019 CT FINDINGS: Gaseous distension of small bowel and colon identified with gas in the rectum. Drain overlying the pelvis and bilateral ureteral stents noted. No suspicious calcifications are identified. IMPRESSION: Gaseous distension of small bowel and colon compatible with ileus. Electronically Signed   By: Margarette Canada M.D.   On: 10/12/2019 15:06    Assessment/Plan:  postoperative day 3.  Robot assisted laparoscopic radical cystectomy, lymph node dissection, ileal conduit formation-  he is progressing nicely.  Appreciate  excellent hospitalist care.   -Transfer to floor  - saline lock IV  -full liquids  -ambulation /incentive spirometry   LOS: 4 days   Festus Aloe 10/13/2019, 1:11 PM

## 2019-10-14 ENCOUNTER — Other Ambulatory Visit: Payer: Self-pay

## 2019-10-14 LAB — COMPREHENSIVE METABOLIC PANEL
ALT: 20 U/L (ref 0–44)
AST: 18 U/L (ref 15–41)
Albumin: 2.6 g/dL — ABNORMAL LOW (ref 3.5–5.0)
Alkaline Phosphatase: 32 U/L — ABNORMAL LOW (ref 38–126)
Anion gap: 8 (ref 5–15)
BUN: 16 mg/dL (ref 8–23)
CO2: 24 mmol/L (ref 22–32)
Calcium: 8 mg/dL — ABNORMAL LOW (ref 8.9–10.3)
Chloride: 102 mmol/L (ref 98–111)
Creatinine, Ser: 0.88 mg/dL (ref 0.61–1.24)
GFR calc Af Amer: >60 mL/min (ref >60)
GFR calc non Af Amer: >60 mL/min (ref >60)
Glucose, Bld: 160 mg/dL — ABNORMAL HIGH (ref 70–99)
Potassium: 3.7 mmol/L (ref 3.5–5.1)
Sodium: 134 mmol/L — ABNORMAL LOW (ref 135–145)
Total Bilirubin: 1 mg/dL (ref 0.3–1.2)
Total Protein: 5.4 g/dL — ABNORMAL LOW (ref 6.5–8.1)

## 2019-10-14 LAB — GLUCOSE, CAPILLARY
Glucose-Capillary: 168 mg/dL — ABNORMAL HIGH (ref 70–99)
Glucose-Capillary: 283 mg/dL — ABNORMAL HIGH (ref 70–99)
Glucose-Capillary: 199 mg/dL — ABNORMAL HIGH (ref 70–99)
Glucose-Capillary: 198 mg/dL — ABNORMAL HIGH (ref 70–99)

## 2019-10-14 LAB — CBC
HCT: 31.1 % — ABNORMAL LOW (ref 39.0–52.0)
Hemoglobin: 10.3 g/dL — ABNORMAL LOW (ref 13.0–17.0)
MCH: 33 pg (ref 26.0–34.0)
MCHC: 33.1 g/dL (ref 30.0–36.0)
MCV: 99.7 fL (ref 80.0–100.0)
Platelets: 142 10*3/uL — ABNORMAL LOW (ref 150–400)
RBC: 3.12 MIL/uL — ABNORMAL LOW (ref 4.22–5.81)
RDW: 12.6 % (ref 11.5–15.5)
WBC: 6.5 10*3/uL (ref 4.0–10.5)
nRBC: 0 % (ref 0.0–0.2)

## 2019-10-14 LAB — MAGNESIUM: Magnesium: 1.8 mg/dL (ref 1.7–2.4)

## 2019-10-14 MED ORDER — PANTOPRAZOLE SODIUM 40 MG PO TBEC
40.0000 mg | DELAYED_RELEASE_TABLET | Freq: Two times a day (BID) | ORAL | Status: DC
  Administered 2019-10-14 – 2019-10-15 (×3): 40 mg via ORAL
  Filled 2019-10-14 (×3): qty 1

## 2019-10-14 MED ORDER — HYDRALAZINE HCL 20 MG/ML IJ SOLN: 10.0000 mg | INTRAMUSCULAR | Status: DC | PRN

## 2019-10-14 MED ORDER — LISINOPRIL 5 MG PO TABS
5.0000 mg | ORAL_TABLET | Freq: Every day | ORAL | Status: DC
  Administered 2019-10-14 – 2019-10-15 (×2): 5 mg via ORAL
  Filled 2019-10-14: qty 2
  Filled 2019-10-14: qty 1

## 2019-10-14 NOTE — Progress Notes (Signed)
4 Days Post-Op Subjective: Patient reports He is doing well.  He has been ambulating in the halls.  He has been tolerating full liquids.  Passing flatus.  Objective: Vital signs in last 24 hours: Temp:  [97.1 F (36.2 C)-99.5 F (37.5 C)] 98.1 F (36.7 C) (10/18 0800) Pulse Rate:  [76-92] 79 (10/18 0800) Resp:  [16-22] 20 (10/18 0800) BP: (135-168)/(64-127) 163/98 (10/18 0800) SpO2:  [94 %-98 %] 97 % (10/18 0800)  Intake/Output from previous day: 10/17 0701 - 10/18 0700 In: 1643.5 [I.V.:1407.1; IV Piggyback:236.3] Out: 2265 [Urine:1975; Drains:290] Intake/Output this shift: Total I/O In: -  Out: 25 [Drains:25]  Physical Exam:  He is watching TV in bed  Neurologic- no  focal deficits, moving all extremities  Cardiovascular-regular rate and rhythm  Pulmonary -clear to auscultation bilaterally  Abdomen -soft and nontender, good bowel sounds, incision clean dry and intact, pink viable stoma, urine clear  Extremities-no calf pain or swelling   Lab Results: Recent Labs    10/12/19 0213 10/13/19 0411 10/14/19 0500  HGB 12.2* 10.3* 10.3*  HCT 35.9* 31.2* 31.1*   BMET Recent Labs    10/13/19 0411 10/14/19 0500  NA 133* 134*  K 3.7 3.7  CL 102 102  CO2 24 24  GLUCOSE 182* 160*  BUN 22 16  CREATININE 0.94 0.88  CALCIUM 7.9* 8.0*   No results for input(s): LABPT, INR in the last 72 hours. No results for input(s): LABURIN in the last 72 hours. Results for orders placed or performed during the hospital encounter of 10/09/19  SARS Coronavirus 2 by RT PCR (hospital order, performed in [facility] hospital lab) Nasopharyngeal Nasopharyngeal Swab     Status: None   Collection Time: 10/09/19  3:13 PM   Specimen: Nasopharyngeal Swab  Result Value Ref Range Status   SARS Coronavirus 2 NEGATIVE NEGATIVE Final    Comment: (NOTE) If result is NEGATIVE SARS-CoV-2 target nucleic acids are NOT DETECTED. The SARS-CoV-2 RNA is generally detectable in upper and lower   respiratory specimens during the acute phase of infection. The lowest  concentration of SARS-CoV-2 viral copies this assay can detect is 250  copies / mL. A negative result does not preclude SARS-CoV-2 infection  and should not be used as the sole basis for treatment or other  patient management decisions.  A negative result may occur with  improper specimen collection / handling, submission of specimen other  than nasopharyngeal swab, presence of viral mutation(s) within the  areas targeted by this assay, and inadequate number of viral copies  (<250 copies / mL). A negative result must be combined with clinical  observations, patient history, and epidemiological information. If result is POSITIVE SARS-CoV-2 target nucleic acids are DETECTED. The SARS-CoV-2 RNA is generally detectable in upper and lower  respiratory specimens dur ing the acute phase of infection.  Positive  results are indicative of active infection with SARS-CoV-2.  Clinical  correlation with patient history and other diagnostic information is  necessary to determine patient infection status.  Positive results do  not rule out bacterial infection or co-infection with other viruses. If result is PRESUMPTIVE POSTIVE SARS-CoV-2 nucleic acids MAY BE PRESENT.   A presumptive positive result was obtained on the submitted specimen  and confirmed on repeat testing.  While 2019 novel coronavirus  (SARS-CoV-2) nucleic acids may be present in the submitted sample  additional confirmatory testing may be necessary for epidemiological  and / or clinical management purposes  to differentiate between  SARS-CoV-2 and other  Sarbecovirus currently known to infect humans.  If clinically indicated additional testing with an alternate test  methodology [phone]) is advised. The SARS-CoV-2 RNA is generally  detectable in upper and lower respiratory sp ecimens during the acute  phase of infection. The expected result is Negative. Fact  Sheet for Patients:  StrictlyIdeas.no Fact Sheet for Healthcare Providers: BankingDealers.co.za This test is not yet approved or cleared by the [place] FDA and has been authorized for detection and/or diagnosis of SARS-CoV-2 by FDA under an Emergency Use Authorization (EUA).  This EUA will remain in effect (meaning this test can be used) for the duration of the COVID-19 declaration under Section 564(b)(1) of the Act, 21 U.S.C. section 360bbb-3(b)(1), unless the authorization is terminated or revoked sooner. Performed at [facility], [place] [address], [place], [place] [address]   MRSA PCR Screening     Status: None   Collection Time: 10/09/19  3:13 PM   Specimen: Nasopharyngeal  Result Value Ref Range Status   MRSA by PCR NEGATIVE NEGATIVE Final    Comment:        The GeneXpert MRSA Assay (FDA approved for NASAL specimens only), is one component of a comprehensive MRSA colonization surveillance program. It is not intended to diagnose MRSA infection nor to guide or monitor treatment for MRSA infections. Performed at [facility], [place] [address], [place], [place] [address]     Studies/Results: Dg Abd 1 View  Result Date: 10/12/2019 CLINICAL DATA:  Ileus.  Status post cystoprostatectomy. EXAM: ABDOMEN - 1 VIEW COMPARISON:  08/28/2019 CT FINDINGS: Gaseous distension of small bowel and colon identified with gas in the rectum. Drain overlying the pelvis and bilateral ureteral stents noted. No suspicious calcifications are identified. IMPRESSION: Gaseous distension of small bowel and colon compatible with ileus. Electronically Signed   By: Margarette Canada M.D.   On: 10/12/2019 15:06    Assessment/Plan:   postop day 4.-   regular diet  continue to ambulate and gain strength   LOS: 5 days   Festus Aloe 10/14/2019, 10:28 AM

## 2019-10-14 NOTE — Progress Notes (Signed)
Physical Therapy Treatment Patient Details Name: Gregory Cummings MRN: [id number] DOB: 01/22/1946 Today's Date: 10/14/2019    History of Present Illness 73 y.o. male admitted with bladder cancer, s/p cystoprostatectomy 10/10/19. PMH of IDDM, CAD, hernia repair.    PT Comments    Pt continues very motivated and progressing well with activity tolerance and ambulatory balance.   Follow Up Recommendations  No PT follow up     Equipment Recommendations  None recommended by PT    Recommendations for Other Services       Precautions / Restrictions Precautions Precautions: Fall Precaution Comments: JP drain on L Restrictions Weight Bearing Restrictions: No    Mobility  Bed Mobility               General bed mobility comments: Pt up at EOB with nursing   Transfers Overall transfer level: Needs assistance Equipment used: Rolling walker (2 wheeled) Transfers: Sit to/from Stand Sit to Stand: Min guard         General transfer comment: steady assist   Ambulation/Gait Ambulation/Gait assistance: Min assist;Min guard Gait Distance (Feet): 1000 Feet Assistive device: Rolling walker (2 wheeled);1 person hand held assist;None Gait Pattern/deviations: Step-through pattern;Decreased step length - right;Decreased step length - left;Shuffle;Trunk flexed;Wide base of support Gait velocity: mod pace   General Gait Details: 300' with RW progressing to 400' HHA and then to 300' with gait belt and ambulating next to rail.  Continued mild instabilty but marked improvement from last session   Stairs             Wheelchair Mobility    Modified Rankin (Stroke Patients Only)       Balance Overall balance assessment: Needs assistance Sitting-balance support: No upper extremity supported;Feet supported Sitting balance-Leahy Scale: Good     Standing balance support: No upper extremity supported Standing balance-Leahy Scale: Fair                               Cognition Arousal/Alertness: Awake/alert Behavior During Therapy: WFL for tasks assessed/performed Overall Cognitive Status: Within Functional Limits for tasks assessed                                        Exercises      General Comments        Pertinent Vitals/Pain Pain Assessment: 0-10 Pain Score: 2  Pain Location: abdomen Pain Descriptors / Indicators: Sore Pain Intervention(s): Limited activity within patient's tolerance;Monitored during session    Home Living                      Prior Function            PT Goals (current goals can now be found in the care plan section) Acute Rehab PT Goals Patient Stated Goal: Regain IND PT Goal Formulation: With patient Time For Goal Achievement: 10/26/19 Potential to Achieve Goals: Good Progress towards PT goals: Progressing toward goals    Frequency    Min 3X/week      PT Plan Current plan remains appropriate    Co-evaluation              AM-PAC PT "6 Clicks" Mobility   Outcome Measure  Help needed turning from your back to your side while in a flat bed without using bedrails?: A Little Help needed moving from lying  on your back to sitting on the side of a flat bed without using bedrails?: A Little Help needed moving to and from a bed to a chair (including a wheelchair)?: A Little Help needed standing up from a chair using your arms (e.g., wheelchair or bedside chair)?: A Little Help needed to walk in hospital room?: A Little Help needed climbing 3-5 steps with a railing? : A Lot 6 Click Score: 17    End of Session Equipment Utilized During Treatment: Gait belt Activity Tolerance: Patient tolerated treatment well Patient left: in chair;with call bell/phone within reach;with family/visitor present Nurse Communication: Mobility status PT Visit Diagnosis: Difficulty in walking, not elsewhere classified (R26.2)     Time: [id number] PT Time Calculation (min) (ACUTE ONLY):  30 min  Charges:  $Gait Training: 23-37 mins                     [place] Pager [phone] Office [phone]    Elaynah [place] 10/14/2019, 4:45 PM

## 2019-10-14 NOTE — Progress Notes (Addendum)
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  Medicine team consulted for possible concerns of ST elevations on telemetry without any chest pain.  Cardiology curbside by urology, nonconcerning.  Also complicated by hyponatremia and abdominal discomfort.   Assessment & Plan:   Principal Problem:   Bladder cancer ([facility]) Active Problems:   Malignant neoplasm of urinary bladder (HCC)   Hyponatremia   CAD (coronary artery disease)   DM2 (diabetes mellitus, type 2) (HCC)   Hyponatremia, resolved -Combination of intravascular dehydration/hypotonic fluids and pseudohyponatremia in the setting of hyperglycemia -Continue normal saline.  Once oral intake adequate, we can discontinue fluids -Closely monitor sodium levels. -Urine electrolytes-urine sodium less than 10  Refractory nausea and abdominal distention, resolved Ileus -Abdominal x-ray consistent with gaseous distention and ileus -Out of bed to chair, continue to mobilize. -Keep potassium as close to 4.0 as possible. -If nausea vomiting persist, place NG tube  Invasive bladder cancer -Status post surgery.  Management per urology service.  Diet per urology service  Abnormal changes on telemetry/ST elevation -Chest pain-free.  Troponin pretty much unremarkable. -EKG normal sinus rhythm. -Echocardiogram-50-55%.  History of CAD status post PCI -Resume aspirin when cleared by urology -Resume Coreg, lisinopril on hold  Diabetes mellitus type 2, insulin-dependent -Hemoglobin A1c 6.6.  Glucose in relatively  decent range. -Lantus 6 units daily.  Insulin sliding scale Accu-Chek.  GERD -Cont PPI  Uncontrolled HTN -hydralazine prn, cont Coreg. Add Lisinopril 5mg  po daily.    DVT prophylaxis: Per urology Code Status: Full code Family Communication: None at bedside Disposition Plan: Okay to transfer to telemetry  Subjective: Feels much better.  Passing gas.  Denies any abdominal pain, nausea or vomiting.  Had a bowel movement yesterday.  Review of Systems Otherwise negative except as per HPI, including: General = no fevers, chills, dizziness, malaise, fatigue HEENT/EYES = negative for pain, redness, loss of vision, double vision, blurred vision, loss of hearing, sore throat, hoarseness, dysphagia Cardiovascular= negative for chest pain, palpitation, murmurs, lower extremity swelling Respiratory/lungs= negative for shortness of breath, cough, hemoptysis, wheezing, mucus production Gastrointestinal= negative for nausea, vomiting,, abdominal pain, melena, hematemesis Genitourinary= negative for Dysuria, Hematuria, Change in Urinary Frequency MSK = Negative for arthralgia, myalgias, Back Pain, Joint swelling  Neurology= Negative for headache, seizures, numbness, tingling  Psychiatry= Negative for anxiety, depression, suicidal and homocidal ideation Allergy/Immunology= Medication/Food allergy as listed  Skin= Negative for Rash, lesions, ulcers, itching   Objective: Vitals:   10/14/19 0300 10/14/19 0354 10/14/19 0400 10/14/19 0800  BP:   135/72 (!) 163/98  Pulse: 76  79 79  Resp: 17  16 20   Temp:  98 F (36.7 C)  98.1 F (36.7 C)  TempSrc:  Oral    SpO2: 97%  98% 97%  Weight:      Height:        Intake/Output Summary (Last 24 hours) at 10/14/2019 0930 Last data filed at 10/14/2019 0600 Gross per 24 hour  Intake 1643.46 ml  Output 2215 ml  Net -571.54 ml   Filed Weights   10/09/19 1500  Weight: 98.4 kg    Examination:  Constitutional: NAD, calm, comfortable  Eyes: PERRL,  lids and conjunctivae normal ENMT: Mucous membranes are moist. Posterior pharynx clear of any exudate or lesions.Normal dentition.  Neck: normal, supple, no masses, no thyromegaly Respiratory: clear to auscultation bilaterally, no wheezing, no crackles. Normal respiratory effort. No accessory muscle use.  Cardiovascular: Regular rate and rhythm, no murmurs / rubs / gallops. No extremity edema. 2+ pedal pulses. No carotid bruits.  Abdomen: Abdomen is nontender nondistended.  Urostomy bag noted.  Positive bowel sounds. Musculoskeletal: no clubbing / cyanosis. No joint deformity upper and lower extremities. Good ROM, no contractures. Normal muscle tone.  Skin: no rashes, lesions, ulcers. No induration Neurologic: CN 2-12 grossly intact. Sensation intact, DTR normal. Strength 5/5 in all 4.  Psychiatric: Normal judgment and insight. Alert and oriented x 3. Normal mood.   Right upper quadrant urostomy Urethral drain noted  Data Reviewed:   CBC: Recent Labs  Lab 10/09/19 1611 10/10/19 1526 10/11/19 0500 10/12/19 0213 10/13/19 0411 10/14/19 0500  WBC 4.6  --   --   --   --  6.5  HGB 12.8* 12.1* 11.8* 12.2* 10.3* 10.3*  HCT 39.0 36.8* 35.1* 35.9* 31.2* 31.1*  MCV 100.8*  --   --   --   --  99.7  PLT 213  --   --   --   --  [id number]*   Basic Metabolic Panel: Recent Labs  Lab 10/11/19 0500 10/12/19 0213 10/12/19 1655 10/13/19 0411 10/14/19 0500  NA 131* 126* 132* 133* 134*  K 3.8 3.9 4.4 3.7 3.7  CL 97* 95* 100 102 102  CO2 21* 20* 24 24 24   GLUCOSE 268* 236* 182* 182* 160*  BUN 22 23 22 22 16   CREATININE 1.05 1.11 1.02 0.94 0.88  CALCIUM 8.6* 8.3* 8.5* 7.9* 8.0*  MG  --   --   --   --  1.8   GFR: Estimated Creatinine Clearance: 83.9 mL/min (by C-G formula based on SCr of 0.88 mg/dL). Liver Function Tests: Recent Labs  Lab 10/09/19 1611 10/14/19 0500  AST 25 18  ALT 23 20  ALKPHOS 46 32*  BILITOT 1.4* 1.0  PROT 6.9 5.4*  ALBUMIN 3.7 2.6*   No results for input(s):  LIPASE, AMYLASE in the last 168 hours. No results for input(s): AMMONIA in the last 168 hours. Coagulation Profile: No results for input(s): INR, PROTIME in the last 168 hours. Cardiac Enzymes: No results for input(s): CKTOTAL, CKMB, CKMBINDEX, TROPONINI in the last 168 hours. BNP (last 3 results) No results for input(s): PROBNP in the last 8760 hours. HbA1C: Recent Labs    10/12/19 1655  HGBA1C 6.6*   CBG: Recent Labs  Lab 10/13/19 0913 10/13/19 1159 10/13/19 1618 10/13/19 2127 10/14/19 0752  GLUCAP 176* 176* 249* 166* 168*   Lipid Profile: No results for input(s): CHOL, HDL, LDLCALC, TRIG, CHOLHDL, LDLDIRECT in the last 72 hours. Thyroid Function Tests: No results for input(s): TSH, T4TOTAL, FREET4, T3FREE, THYROIDAB in the last 72 hours. Anemia Panel: No results for input(s): VITAMINB12, FOLATE, FERRITIN, TIBC, IRON, RETICCTPCT in the last 72 hours. Sepsis Labs: No results for input(s): PROCALCITON, LATICACIDVEN in the last 168 hours.  Recent Results (from the past 240 hour(s))  SARS Coronavirus 2 by RT PCR (hospital order, performed in [facility] hospital lab) Nasopharyngeal Nasopharyngeal Swab     Status: None   Collection Time: 10/09/19  3:13 PM   Specimen: Nasopharyngeal Swab  Result Value Ref Range Status   SARS Coronavirus 2 NEGATIVE NEGATIVE Final  Comment: (NOTE) If result is NEGATIVE SARS-CoV-2 target nucleic acids are NOT DETECTED. The SARS-CoV-2 RNA is generally detectable in upper and lower  respiratory specimens during the acute phase of infection. The lowest  concentration of SARS-CoV-2 viral copies this assay can detect is 250  copies / mL. A negative result does not preclude SARS-CoV-2 infection  and should not be used as the sole basis for treatment or other  patient management decisions.  A negative result may occur with  improper specimen collection / handling, submission of specimen other  than nasopharyngeal swab, presence of viral  mutation(s) within the  areas targeted by this assay, and inadequate number of viral copies  (<250 copies / mL). A negative result must be combined with clinical  observations, patient history, and epidemiological information. If result is POSITIVE SARS-CoV-2 target nucleic acids are DETECTED. The SARS-CoV-2 RNA is generally detectable in upper and lower  respiratory specimens dur ing the acute phase of infection.  Positive  results are indicative of active infection with SARS-CoV-2.  Clinical  correlation with patient history and other diagnostic information is  necessary to determine patient infection status.  Positive results do  not rule out bacterial infection or co-infection with other viruses. If result is PRESUMPTIVE POSTIVE SARS-CoV-2 nucleic acids MAY BE PRESENT.   A presumptive positive result was obtained on the submitted specimen  and confirmed on repeat testing.  While 2019 novel coronavirus  (SARS-CoV-2) nucleic acids may be present in the submitted sample  additional confirmatory testing may be necessary for epidemiological  and / or clinical management purposes  to differentiate between  SARS-CoV-2 and other Sarbecovirus currently known to infect humans.  If clinically indicated additional testing with an alternate test  methodology [id number]) is advised. The SARS-CoV-2 RNA is generally  detectable in upper and lower respiratory sp ecimens during the acute  phase of infection. The expected result is Negative. Fact Sheet for Patients:  StrictlyIdeas.no Fact Sheet for Healthcare Providers: BankingDealers.co.za This test is not yet approved or cleared by the [place] FDA and has been authorized for detection and/or diagnosis of SARS-CoV-2 by FDA under an Emergency Use Authorization (EUA).  This EUA will remain in effect (meaning this test can be used) for the duration of the COVID-19 declaration under Section 564(b)(1)  of the Act, 21 U.S.C. section 360bbb-3(b)(1), unless the authorization is terminated or revoked sooner. Performed at [facility], [place] [address] [place], [place] [address]   MRSA PCR Screening     Status: None   Collection Time: 10/09/19  3:13 PM   Specimen: Nasopharyngeal  Result Value Ref Range Status   MRSA by PCR NEGATIVE NEGATIVE Final    Comment:        The GeneXpert MRSA Assay (FDA approved for NASAL specimens only), is one component of a comprehensive MRSA colonization surveillance program. It is not intended to diagnose MRSA infection nor to guide or monitor treatment for MRSA infections. Performed at [facility], [place] [address] [place],  [address]          Radiology Studies: Dg Abd 1 View  Result Date: 10/12/2019 CLINICAL DATA:  Ileus.  Status post cystoprostatectomy. EXAM: ABDOMEN - 1 VIEW COMPARISON:  08/28/2019 CT FINDINGS: Gaseous distension of small bowel and colon identified with gas in the rectum. Drain overlying the pelvis and bilateral ureteral stents noted. No suspicious calcifications are identified. IMPRESSION: Gaseous distension of small bowel and colon compatible with ileus. Electronically Signed   By:  Margarette Canada M.D.   On: 10/12/2019 15:06        Scheduled Meds: . aspirin EC  81 mg Oral Daily  . carvedilol  3.125 mg Oral Daily  . Chlorhexidine Gluconate Cloth  6 each Topical Daily  . heparin lock flush  500 Units Intracatheter Q30 days  . insulin aspart  0-20 Units Subcutaneous TID WC  . insulin aspart  0-5 Units Subcutaneous QHS  . insulin glargine  6 Units Subcutaneous Daily  . mouth rinse  15 mL Mouth Rinse BID  . pantoprazole (PROTONIX) IV  40 mg Intravenous Q12H  . simvastatin  20 mg Oral QHS  . sodium chloride flush  10-40 mL Intracatheter Q12H   Continuous Infusions:    LOS: 5 days   Time spent= 25 mins    Emmaus Brandi Arsenio Loader, MD [facility]  If 7PM-7AM, please  contact night-coverage  10/14/2019, 9:30 AM

## 2019-10-15 LAB — COMPREHENSIVE METABOLIC PANEL
ALT: 23 U/L (ref 0–44)
AST: 25 U/L (ref 15–41)
Albumin: 2.7 g/dL — ABNORMAL LOW (ref 3.5–5.0)
Alkaline Phosphatase: 34 U/L — ABNORMAL LOW (ref 38–126)
Anion gap: 7 (ref 5–15)
BUN: 17 mg/dL (ref 8–23)
CO2: 24 mmol/L (ref 22–32)
Calcium: 8.2 mg/dL — ABNORMAL LOW (ref 8.9–10.3)
Chloride: 102 mmol/L (ref 98–111)
Creatinine, Ser: 0.81 mg/dL (ref 0.61–1.24)
GFR calc Af Amer: >60 mL/min (ref >60)
GFR calc non Af Amer: >60 mL/min (ref >60)
Glucose, Bld: 196 mg/dL — ABNORMAL HIGH (ref 70–99)
Potassium: 3.7 mmol/L (ref 3.5–5.1)
Sodium: 133 mmol/L — ABNORMAL LOW (ref 135–145)
Total Bilirubin: 0.9 mg/dL (ref 0.3–1.2)
Total Protein: 5.4 g/dL — ABNORMAL LOW (ref 6.5–8.1)

## 2019-10-15 LAB — CBC
HCT: 31.8 % — ABNORMAL LOW (ref 39.0–52.0)
Hemoglobin: 10.7 g/dL — ABNORMAL LOW (ref 13.0–17.0)
MCH: 33.1 pg (ref 26.0–34.0)
MCHC: 33.6 g/dL (ref 30.0–36.0)
MCV: 98.5 fL (ref 80.0–100.0)
Platelets: 160 10*3/uL (ref 150–400)
RBC: 3.23 MIL/uL — ABNORMAL LOW (ref 4.22–5.81)
RDW: 12.4 % (ref 11.5–15.5)
WBC: 5.7 10*3/uL (ref 4.0–10.5)
nRBC: 0 % (ref 0.0–0.2)

## 2019-10-15 LAB — GLUCOSE, CAPILLARY
Glucose-Capillary: 199 mg/dL — ABNORMAL HIGH (ref 70–99)
Glucose-Capillary: 284 mg/dL — ABNORMAL HIGH (ref 70–99)

## 2019-10-15 LAB — MAGNESIUM: Magnesium: 1.7 mg/dL (ref 1.7–2.4)

## 2019-10-15 LAB — CREATININE, FLUID (PLEURAL, PERITONEAL, JP DRAINAGE): Creat, Fluid: 0.9 mg/dL

## 2019-10-15 MED ORDER — MAGNESIUM OXIDE 400 (241.3 MG) MG PO TABS
800.0000 mg | ORAL_TABLET | Freq: Once | ORAL | Status: AC
  Administered 2019-10-15: 800 mg via ORAL
  Filled 2019-10-15: qty 2

## 2019-10-15 MED ORDER — LISINOPRIL 5 MG PO TABS: 5.0000 mg | ORAL_TABLET | Freq: Every day | ORAL | 0 refills | Status: DC

## 2019-10-15 MED ORDER — INSULIN GLARGINE 100 UNIT/ML ~~LOC~~ SOLN
8.0000 [IU] | Freq: Every day | SUBCUTANEOUS | Status: DC
  Administered 2019-10-15: 8 [IU] via SUBCUTANEOUS
  Filled 2019-10-15: qty 0.08

## 2019-10-15 MED ORDER — POTASSIUM CHLORIDE CRYS ER 20 MEQ PO TBCR
40.0000 meq | EXTENDED_RELEASE_TABLET | Freq: Once | ORAL | Status: AC
  Administered 2019-10-15: 40 meq via ORAL
  Filled 2019-10-15: qty 2

## 2019-10-15 MED ORDER — HEPARIN SOD (PORK) LOCK FLUSH 100 UNIT/ML IV SOLN
500.0000 [IU] | INTRAVENOUS | Status: AC | PRN
  Administered 2019-10-15: 500 [IU]
  Filled 2019-10-15: qty 5

## 2019-10-15 NOTE — TOC Transition Note (Signed)
Transition of Care [facility]) - CM/SW Discharge Note   Patient Details  Name: Gregory Cummings MRN: [id number] Date of Birth: 1946-03-16  [facility] CM/SW Contact:  Dessa Phi, RN Phone Number: 10/15/2019, 3:52 PM   Clinical Narrative:  Patient was ordered for HHRN-urostomy instruction.Several HHC agencies contacted-[facility], [facility], [facility], -all unable to provide HHC-different reasons-no staffing.Spoke to patient he feels comfortable with instruction given, videos to watch, & friend support,also his dtr will come up this weekend from [place]-she a nurse. TC to Dr. Zettie Pho office-spoke to Angel-informed of current status & d/c plan. No further CM needs.     Final next level of care: Home/Self Care Barriers to Discharge: No Barriers Identified   Patient Goals and CMS Choice Patient states their goals for this hospitalization and ongoing recovery are:: go home CMS Medicare.gov Compare Post Acute Care list provided to:: Patient    Discharge Placement                       Discharge Plan and Services                                     Social Determinants of Health (SDOH) Interventions     Readmission Risk Interventions No flowsheet data found.

## 2019-10-15 NOTE — Discharge Summary (Addendum)
Date of admission: 10/09/2019  Date of discharge: 10/15/2019  Admission diagnosis: Bladder cancer  Discharge diagnosis: Bladder cancer  Secondary diagnoses: None  History and Physical: For full details, please see admission history and physical. Briefly, Gregory Cummings is a 73 y.o. year old patient with bladder cancer for which radical cystectomy was indicated.   Hospital Course:  73 yo M with history of bladder cancer s/p robot assisted laparoscopic cystoprostatectomy with ileal conduit diversion and lymph node dissection on 10/10/2019. He was preadmitted day prior for bowel prep and ostomy marking.  On postop day 2, he was noted to have some ST changes on telemetry and mildly elevated troponins.  His EKG appeared stable from prior, and cardiology recommended following up with an echocardiogram.  Echo was performed on postop day 2, and was non concerning with preserved ejection fraction.  Hospitalist team was consulted for the ST changes and for hyponatremia as well.  They assisted in management of fluids and electrolytes, after which his hyponatremia improved.  After evaluation by hospitalist, it was determined that he had no acute cardiac process ongoing.  He was restarted on his home medications including aspirin for known CAD.  Throughout his hospital course, and he had no complaints of chest pain or shortness of breath.  After the above events, he progressed very well.  As of postop day 3, he no longer required pain medications and had no notable pain.  His diet was slowly advanced, and by discharge he was tolerating a regular diet.  He began passing flatus and on postop day 4, started having bowel movements.  He was ambulating well, and passed evaluation by physical therapy.  He received ostomy teaching from ostomy care nurse.  JP creatinine was checked prior to discharge, and was negative for urine leak.  Therefore, JP drain was removed prior to discharge.  He is discharging with urostomy in  place, and to bander ureteral stents in place as well.  We will follow up with [place] outpatient for postop check and ureteral stents removal.  Physical Exam: Vitals:   10/15/19 0419 10/15/19 1350  BP: 119/77 119/76  Pulse: 79 76  Resp: 18 18  Temp: 99 F (37.2 C) 99 F (37.2 C)  SpO2: 96% 97%   Neurologic- no focal deficits Cardiovascular- regular rate Pulmonary - normal work of breathing Abdomen - soft and nontender, incisions clean dry and intact, pink viable stoma with two bander stents emanating in appropriate position, urine clear  Laboratory values:  Recent Labs    10/13/19 0411 10/14/19 0500 10/15/19 0548  HGB 10.3* 10.3* 10.7*  HCT 31.2* 31.1* 31.8*   Recent Labs    10/14/19 0500 10/15/19 0548  CREATININE 0.88 0.81    Disposition: Home   Discharge medications:  Allergies as of 10/15/2019      Reactions   No Known Allergies       Medication List    TAKE these medications   aspirin 81 MG tablet Take 81 mg by mouth daily.   carvedilol 3.125 MG tablet Commonly known as: COREG Take 3.125 mg by mouth daily.   famotidine 10 MG tablet Commonly known as: PEPCID Take 10 mg by mouth 2 (two) times daily as needed for heartburn or indigestion.   insulin regular 100 units/mL injection Commonly known as: NOVOLIN R Inject 30-100 Units into the skin 3 (three) times daily before meals. 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   lisinopril 5 MG tablet Commonly known as: ZESTRIL Take 1 tablet (5 mg total) by mouth daily. For future refills, please discuss with your PCP. Start taking on: October 16, 2019 What changed:   medication strength  how much to take  additional instructions   phenazopyridine 200 MG tablet Commonly known as: Pyridium Take 1 tablet (200 mg total) by  mouth 3 (three) times daily as needed for pain.   prochlorperazine 10 MG tablet Commonly known as: COMPAZINE Take 1 tablet (10 mg total) by mouth every 6 (six) hours as needed for nausea or vomiting.   simvastatin 20 MG tablet Commonly known as: ZOCOR Take 20 mg by mouth at bedtime.   traMADol 50 MG tablet Commonly known as: Ultram Take 1-2 tablets (50-100 mg total) by mouth every 6 (six) hours as needed for moderate pain.       Followup:  Follow-up Information    Alexis Frock, MD On 11/05/2019.   Specialty: Urology Why: at 12:45 Contact information: [place] [address] [phone]           I have seen and examined the patient and agree he is meeting goals for discharge as outlined above.  Stoma pink and working well, Abd soft, Ambulatory.

## 2019-10-15 NOTE — Progress Notes (Signed)
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  Medicine team consulted for possible concerns of ST elevations on telemetry without any chest pain.  Cardiology curbside by urology, nonconcerning.  Also complicated by hyponatremia and abdominal discomfort.   Assessment & Plan:   Principal Problem:   Bladder cancer ([place]) Active Problems:   Malignant neoplasm of urinary bladder (HCC)   Hyponatremia   CAD (coronary artery disease)   DM2 (diabetes mellitus, type 2) (HCC)   Hyponatremia, resolved -This is improved after changing fluid to normal saline and repletion of intravascular fluid.  This morning sodium is 133.  Refractory nausea and abdominal distention, resolved Ileus -Abdominal x-ray consistent with gaseous distention and ileus -Advised to get out of bed to chair and mobilize.  Replete potassium -If patient gets discharged today, he will require lab work in 1 week-BMP/CBC/magnesium levels  Invasive bladder cancer -Status post surgery.  Management per urology service.  Diet per urology service  Abnormal changes on telemetry/ST elevation -Chest pain-free.  Troponin pretty much unremarkable. -EKG normal sinus rhythm. -Echocardiogram-50-55%.  History of CAD status post PCI -Resume aspirin when cleared by urology -Resume Coreg, lisinopril on hold  Diabetes mellitus type 2, insulin-dependent -Hemoglobin A1c 6.6.  Glucose in relatively decent range. -Increase Lantus to 8 units daily.  Insulin sliding scale Accu-Chek.  GERD -Cont PPI  Uncontrolled HTN  -hydralazine prn, cont Coreg.  Lisinopril 5 mg p.o. daily added  Medically doing well, final disposition per urology.  Okay from medicine standpoint if he gets discharged today.  Lisinopril dose to be increased to 5 mg daily.  DVT prophylaxis: Per urology Code Status: Full code Family Communication: None at bedside Disposition Plan: Medically doing well.  Final disposition per urology  Subjective: Tolerating orals without any issues.  Passing gas and having bowel movements.  No complaints.  Review of Systems Otherwise negative except as per HPI, including: General = no fevers, chills, dizziness, malaise, fatigue HEENT/EYES = negative for pain, redness, loss of vision, double vision, blurred vision, loss of hearing, sore throat, hoarseness, dysphagia Cardiovascular= negative for chest pain, palpitation, murmurs, lower extremity swelling Respiratory/lungs= negative for shortness of breath, cough, hemoptysis, wheezing, mucus production Gastrointestinal= negative for nausea, vomiting,, abdominal pain, melena, hematemesis Genitourinary= negative for Dysuria, Hematuria, Change in Urinary Frequency MSK = Negative for arthralgia, myalgias, Back Pain, Joint swelling  Neurology= Negative for headache, seizures, numbness, tingling  Psychiatry= Negative for anxiety, depression, suicidal and homocidal ideation Allergy/Immunology= Medication/Food allergy as listed  Skin= Negative for Rash, lesions, ulcers, itching   Objective: Vitals:   10/14/19 1107 10/14/19 1152 10/14/19 2052 10/15/19 0419  BP: (!) 148/65 (!) 151/98 129/80 119/77  Pulse:  83 79 79  Resp:  16 18 18   Temp:  98.3 F (36.8 C) 98.2 F (36.8 C) 99 F (37.2 C)  TempSrc:  Oral  Oral  SpO2:  98% 97% 96%  Weight:      Height:        Intake/Output Summary (Last 24 hours) at 10/15/2019 1006 Last data filed at 10/15/2019 0917 Gross per 24 hour  Intake 560 ml  Output 1630 ml  Net -1070  ml   Filed Weights   10/09/19 1500   Weight: 98.4 kg    Examination:  Constitutional: NAD, calm, comfortable Eyes: PERRL, lids and conjunctivae normal ENMT: Mucous membranes are moist. Posterior pharynx clear of any exudate or lesions.Normal dentition.  Neck: normal, supple, no masses, no thyromegaly Respiratory: clear to auscultation bilaterally, no wheezing, no crackles. Normal respiratory effort. No accessory muscle use.  Cardiovascular: Regular rate and rhythm, no murmurs / rubs / gallops. No extremity edema. 2+ pedal pulses. No carotid bruits.  Abdomen: Scars on the abdomen noted from the surgery Musculoskeletal: no clubbing / cyanosis. No joint deformity upper and lower extremities. Good ROM, no contractures. Normal muscle tone.  Skin: no rashes, lesions, ulcers. No induration Neurologic: CN 2-12 grossly intact. Sensation intact, DTR normal. Strength 5/5 in all 4.  Psychiatric: Normal judgment and insight. Alert and oriented x 3. Normal mood.    Right upper quadrant urostomy Urethral drain noted  Data Reviewed:   CBC: Recent Labs  Lab 10/09/19 1611  10/11/19 0500 10/12/19 0213 10/13/19 0411 10/14/19 0500 10/15/19 0548  WBC 4.6  --   --   --   --  6.5 5.7  HGB 12.8*   < > 11.8* 12.2* 10.3* 10.3* 10.7*  HCT 39.0   < > 35.1* 35.9* 31.2* 31.1* 31.8*  MCV 100.8*  --   --   --   --  99.7 98.5  PLT 213  --   --   --   --  142* 160   < > = values in this interval not displayed.   Basic Metabolic Panel: Recent Labs  Lab 10/12/19 0213 10/12/19 1655 10/13/19 0411 10/14/19 0500 10/15/19 0548  NA 126* 132* 133* 134* 133*  K 3.9 4.4 3.7 3.7 3.7  CL 95* 100 102 102 102  CO2 20* 24 24 24 24   GLUCOSE 236* 182* 182* 160* 196*  BUN 23 22 22 16 17   CREATININE 1.11 1.02 0.94 0.88 0.81  CALCIUM 8.3* 8.5* 7.9* 8.0* 8.2*  MG  --   --   --  1.8 1.7   GFR: Estimated Creatinine Clearance: 91.2 mL/min (by C-G formula based on SCr of 0.81 mg/dL). Liver Function Tests: Recent Labs  Lab 10/09/19 1611 10/14/19 0500  10/15/19 0548  AST 25 18 25   ALT 23 20 23   ALKPHOS 46 32* 34*  BILITOT 1.4* 1.0 0.9  PROT 6.9 5.4* 5.4*  ALBUMIN 3.7 2.6* 2.7*   No results for input(s): LIPASE, AMYLASE in the last 168 hours. No results for input(s): AMMONIA in the last 168 hours. Coagulation Profile: No results for input(s): INR, PROTIME in the last 168 hours. Cardiac Enzymes: No results for input(s): CKTOTAL, CKMB, CKMBINDEX, TROPONINI in the last 168 hours. BNP (last 3 results) No results for input(s): PROBNP in the last 8760 hours. HbA1C: Recent Labs    10/12/19 1655  HGBA1C 6.6*   CBG: Recent Labs  Lab 10/14/19 0752 10/14/19 1201 10/14/19 1751 10/14/19 2051 10/15/19 0735  GLUCAP 168* 283* 199* 198* 199*   Lipid Profile: No results for input(s): CHOL, HDL, LDLCALC, TRIG, CHOLHDL, LDLDIRECT in the last 72 hours. Thyroid Function Tests: No results for input(s): TSH, T4TOTAL, FREET4, T3FREE, THYROIDAB in the last 72 hours. Anemia Panel: No results for input(s): VITAMINB12, FOLATE, FERRITIN, TIBC, IRON, RETICCTPCT in the last 72 hours. Sepsis Labs: No results for input(s): PROCALCITON, LATICACIDVEN in the last 168 hours.  Recent Results (from the past 240 hour(s))  SARS Coronavirus 2 by RT  PCR (hospital order, performed in [facility] hospital lab) Nasopharyngeal Nasopharyngeal Swab     Status: None   Collection Time: 10/09/19  3:13 PM   Specimen: Nasopharyngeal Swab  Result Value Ref Range Status   SARS Coronavirus 2 NEGATIVE NEGATIVE Final    Comment: (NOTE) If result is NEGATIVE SARS-CoV-2 target nucleic acids are NOT DETECTED. The SARS-CoV-2 RNA is generally detectable in upper and lower  respiratory specimens during the acute phase of infection. The lowest  concentration of SARS-CoV-2 viral copies this assay can detect is 250  copies / mL. A negative result does not preclude SARS-CoV-2 infection  and should not be used as the sole basis for treatment or other  patient management  decisions.  A negative result may occur with  improper specimen collection / handling, submission of specimen other  than nasopharyngeal swab, presence of viral mutation(s) within the  areas targeted by this assay, and inadequate number of viral copies  (<250 copies / mL). A negative result must be combined with clinical  observations, patient history, and epidemiological information. If result is POSITIVE SARS-CoV-2 target nucleic acids are DETECTED. The SARS-CoV-2 RNA is generally detectable in upper and lower  respiratory specimens dur ing the acute phase of infection.  Positive  results are indicative of active infection with SARS-CoV-2.  Clinical  correlation with patient history and other diagnostic information is  necessary to determine patient infection status.  Positive results do  not rule out bacterial infection or co-infection with other viruses. If result is PRESUMPTIVE POSTIVE SARS-CoV-2 nucleic acids MAY BE PRESENT.   A presumptive positive result was obtained on the submitted specimen  and confirmed on repeat testing.  While 2019 novel coronavirus  (SARS-CoV-2) nucleic acids may be present in the submitted sample  additional confirmatory testing may be necessary for epidemiological  and / or clinical management purposes  to differentiate between  SARS-CoV-2 and other Sarbecovirus currently known to infect humans.  If clinically indicated additional testing with an alternate test  methodology [phone]) is advised. The SARS-CoV-2 RNA is generally  detectable in upper and lower respiratory sp ecimens during the acute  phase of infection. The expected result is Negative. Fact Sheet for Patients:  StrictlyIdeas.no Fact Sheet for Healthcare Providers: BankingDealers.co.za This test is not yet approved or cleared by the [place] FDA and has been authorized for detection and/or diagnosis of SARS-CoV-2 by FDA under an  Emergency Use Authorization (EUA).  This EUA will remain in effect (meaning this test can be used) for the duration of the COVID-19 declaration under Section 564(b)(1) of the Act, 21 U.S.C. section 360bbb-3(b)(1), unless the authorization is terminated or revoked sooner. Performed at [facility], [place] [address], [place], [place] [address]   MRSA PCR Screening     Status: None   Collection Time: 10/09/19  3:13 PM   Specimen: Nasopharyngeal  Result Value Ref Range Status   MRSA by PCR NEGATIVE NEGATIVE Final    Comment:        The GeneXpert MRSA Assay (FDA approved for NASAL specimens only), is one component of a comprehensive MRSA colonization surveillance program. It is not intended to diagnose MRSA infection nor to guide or monitor treatment for MRSA infections. Performed at [facility], [place] [address], [place], [place] [address]          Radiology Studies: No results found.      Scheduled Meds: . aspirin EC  81 mg Oral Daily  . carvedilol  3.125 mg Oral Daily  . Chlorhexidine Gluconate Cloth  6 each Topical Daily  . heparin lock flush  500 Units Intracatheter Q30 days  . insulin aspart  0-20 Units Subcutaneous TID WC  . insulin aspart  0-5 Units Subcutaneous QHS  . insulin glargine  8 Units Subcutaneous Daily  . lisinopril  5 mg Oral Daily  . mouth rinse  15 mL Mouth Rinse BID  . pantoprazole  40 mg Oral BID  . simvastatin  20 mg Oral QHS  . sodium chloride flush  10-40 mL Intracatheter Q12H   Continuous Infusions:    LOS: 6 days   Time spent= 25 mins    Demtrius Rounds Arsenio Loader, MD [facility]  If 7PM-7AM, please contact night-coverage  10/15/2019, 10:06 AM

## 2019-10-15 NOTE — Consult Note (Signed)
[place] Nurse ostomy consult note Stoma type/location: RLQ ileal conduit  Teaching, pouch change and prepare for discharge.  Patient has disconnected from bedside drainage. He feels comfortable with that process and will only use the bag at night  We discussed the potential fall hazard and he will avoid ambulating while he has the bag in place.  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  I have avoided this area in pouching where I could and protected these areas with no sting skin prep.  Present at 2 o;clock and 7 o'clock.  Red and blue stents in place  Treatment options for stomal/peristomal skin: barrier ring and 1 piece convex pouch Output clear yellow urine Ostomy pouching: 1pc.convex with barrier ring.  I have ordered 4 pouch sets, 4 rings, 1 adaptor and bedside bag for discharge today.  Education provided: Pouch change performed.  Barrier cut by patient and ring applied.  Pouch applied  He is able to open and close spout and understands to empty when 1/3 full. Twice weekly pouch changes and showering discussed Enrolled patient in [place] program: Yes/ he prefers to receive packages at office address: [address]  [place], [place] [address].  I will update with secure start Will not follow at this time.  Please re-consult if needed.  Domenic Moras MSN, RN, FNP-BC CWON Wound, Ostomy, Continence Nurse Pager [phone]

## 2019-10-15 NOTE — Care Management Important Message (Signed)
Important Message  Patient Details IM Letter given to Dessa Phi RN to present to the Patient Name: Gregory Cummings MRN: [id number] Date of Birth: [birth year]   Medicare Important Message Given:  Yes     [name] 10/15/2019, 12:09 PM

## 2019-10-17 LAB — SURGICAL PATHOLOGY

## 2019-10-28 ENCOUNTER — Encounter (HOSPITAL_COMMUNITY): Payer: Self-pay

## 2019-10-28 ENCOUNTER — Inpatient Hospital Stay (HOSPITAL_COMMUNITY)
Admission: EM | Admit: 2019-10-28 | Discharge: 2019-10-31 | DRG: 872 | Disposition: A | Discharge status: Home / Self Care | Payer: Medicare Other | Attending: Internal Medicine | Admitting: Internal Medicine

## 2019-10-28 ENCOUNTER — Emergency Department (HOSPITAL_COMMUNITY): Payer: Medicare Other

## 2019-10-28 ENCOUNTER — Other Ambulatory Visit: Payer: Self-pay

## 2019-10-28 DIAGNOSIS — Z8551 Personal history of malignant neoplasm of bladder: Secondary | ICD-10-CM

## 2019-10-28 DIAGNOSIS — E119 Type 2 diabetes mellitus without complications: Secondary | ICD-10-CM | POA: Diagnosis present

## 2019-10-28 DIAGNOSIS — E785 Hyperlipidemia, unspecified: Secondary | ICD-10-CM | POA: Diagnosis present

## 2019-10-28 DIAGNOSIS — R6520 Severe sepsis without septic shock: Secondary | ICD-10-CM | POA: Diagnosis present

## 2019-10-28 DIAGNOSIS — Z906 Acquired absence of other parts of urinary tract: Secondary | ICD-10-CM

## 2019-10-28 DIAGNOSIS — I10 Essential (primary) hypertension: Secondary | ICD-10-CM | POA: Diagnosis present

## 2019-10-28 DIAGNOSIS — A419 Sepsis, unspecified organism: Principal | ICD-10-CM | POA: Diagnosis present

## 2019-10-28 DIAGNOSIS — Z8249 Family history of ischemic heart disease and other diseases of the circulatory system: Secondary | ICD-10-CM

## 2019-10-28 DIAGNOSIS — Z7982 Long term (current) use of aspirin: Secondary | ICD-10-CM

## 2019-10-28 DIAGNOSIS — Z9221 Personal history of antineoplastic chemotherapy: Secondary | ICD-10-CM | POA: Diagnosis not present

## 2019-10-28 DIAGNOSIS — N390 Urinary tract infection, site not specified: Secondary | ICD-10-CM | POA: Diagnosis present

## 2019-10-28 DIAGNOSIS — Z955 Presence of coronary angioplasty implant and graft: Secondary | ICD-10-CM

## 2019-10-28 DIAGNOSIS — Z87891 Personal history of nicotine dependence: Secondary | ICD-10-CM | POA: Diagnosis not present

## 2019-10-28 DIAGNOSIS — I2510 Atherosclerotic heart disease of native coronary artery without angina pectoris: Secondary | ICD-10-CM | POA: Diagnosis present

## 2019-10-28 DIAGNOSIS — Z20828 Contact with and (suspected) exposure to other viral communicable diseases: Secondary | ICD-10-CM | POA: Diagnosis present

## 2019-10-28 DIAGNOSIS — Z9079 Acquired absence of other genital organ(s): Secondary | ICD-10-CM

## 2019-10-28 DIAGNOSIS — Z20822 Contact with and (suspected) exposure to covid-19: Secondary | ICD-10-CM

## 2019-10-28 DIAGNOSIS — K529 Noninfective gastroenteritis and colitis, unspecified: Secondary | ICD-10-CM | POA: Diagnosis present

## 2019-10-28 DIAGNOSIS — Z79899 Other long term (current) drug therapy: Secondary | ICD-10-CM | POA: Diagnosis not present

## 2019-10-28 DIAGNOSIS — N10 Acute tubulo-interstitial nephritis: Secondary | ICD-10-CM

## 2019-10-28 DIAGNOSIS — I252 Old myocardial infarction: Secondary | ICD-10-CM

## 2019-10-28 DIAGNOSIS — N12 Tubulo-interstitial nephritis, not specified as acute or chronic: Secondary | ICD-10-CM | POA: Diagnosis present

## 2019-10-28 DIAGNOSIS — R197 Diarrhea, unspecified: Secondary | ICD-10-CM | POA: Diagnosis not present

## 2019-10-28 DIAGNOSIS — Z794 Long term (current) use of insulin: Secondary | ICD-10-CM

## 2019-10-28 LAB — CBC WITH DIFFERENTIAL/PLATELET
Abs Immature Granulocytes: 0.03 10*3/uL (ref 0.00–0.07)
Basophils Absolute: 0 10*3/uL (ref 0.0–0.1)
Basophils Relative: 0 %
Eosinophils Absolute: 0 10*3/uL (ref 0.0–0.5)
Eosinophils Relative: 1 %
HCT: 32.4 % — ABNORMAL LOW (ref 39.0–52.0)
Hemoglobin: 10.6 g/dL — ABNORMAL LOW (ref 13.0–17.0)
Immature Granulocytes: 0 %
Lymphocytes Relative: 9 %
Lymphs Abs: 0.6 10*3/uL — ABNORMAL LOW (ref 0.7–4.0)
MCH: 31.7 pg (ref 26.0–34.0)
MCHC: 32.7 g/dL (ref 30.0–36.0)
MCV: 97 fL (ref 80.0–100.0)
Monocytes Absolute: 0.8 10*3/uL (ref 0.1–1.0)
Monocytes Relative: 11 %
Neutro Abs: 5.9 10*3/uL (ref 1.7–7.7)
Neutrophils Relative %: 79 %
Platelets: 295 10*3/uL (ref 150–400)
RBC: 3.34 MIL/uL — ABNORMAL LOW (ref 4.22–5.81)
RDW: 12.9 % (ref 11.5–15.5)
WBC: 7.4 10*3/uL (ref 4.0–10.5)
nRBC: 0 % (ref 0.0–0.2)

## 2019-10-28 LAB — URINALYSIS, ROUTINE W REFLEX MICROSCOPIC
Bilirubin Urine: NEGATIVE
Glucose, UA: NEGATIVE mg/dL
Hgb urine dipstick: NEGATIVE
Ketones, ur: NEGATIVE mg/dL
Nitrite: NEGATIVE
Protein, ur: 100 mg/dL — AB
Specific Gravity, Urine: 1.011 (ref 1.005–1.030)
pH: 9 — ABNORMAL HIGH (ref 5.0–8.0)

## 2019-10-28 LAB — COMPREHENSIVE METABOLIC PANEL
ALT: 15 U/L (ref 0–44)
AST: 16 U/L (ref 15–41)
Albumin: 3.1 g/dL — ABNORMAL LOW (ref 3.5–5.0)
Alkaline Phosphatase: 52 U/L (ref 38–126)
Anion gap: 8 (ref 5–15)
BUN: 21 mg/dL (ref 8–23)
CO2: 26 mmol/L (ref 22–32)
Calcium: 9.3 mg/dL (ref 8.9–10.3)
Chloride: 99 mmol/L (ref 98–111)
Creatinine, Ser: 1.09 mg/dL (ref 0.61–1.24)
GFR calc Af Amer: >60 mL/min (ref >60)
GFR calc non Af Amer: >60 mL/min (ref >60)
Glucose, Bld: 102 mg/dL — ABNORMAL HIGH (ref 70–99)
Potassium: 4.1 mmol/L (ref 3.5–5.1)
Sodium: 133 mmol/L — ABNORMAL LOW (ref 135–145)
Total Bilirubin: 0.8 mg/dL (ref 0.3–1.2)
Total Protein: 6.7 g/dL (ref 6.5–8.1)

## 2019-10-28 LAB — LACTIC ACID, PLASMA: Lactic Acid, Venous: 1.5 mmol/L (ref 0.5–1.9)

## 2019-10-28 LAB — PROTIME-INR
INR: 1 (ref 0.8–1.2)
Prothrombin Time: 13.2 seconds (ref 11.4–15.2)

## 2019-10-28 LAB — GLUCOSE, CAPILLARY: Glucose-Capillary: 146 mg/dL — ABNORMAL HIGH (ref 70–99)

## 2019-10-28 LAB — PROCALCITONIN: Procalcitonin: <0.1 ng/mL

## 2019-10-28 LAB — APTT: aPTT: 26 seconds (ref 24–36)

## 2019-10-28 MED ORDER — INSULIN ASPART 100 UNIT/ML ~~LOC~~ SOLN
0.0000 [IU] | Freq: Every day | SUBCUTANEOUS | Status: DC
  Filled 2019-10-28: qty 0.05

## 2019-10-28 MED ORDER — ONDANSETRON HCL 4 MG/2ML IJ SOLN
4.0000 mg | Freq: Four times a day (QID) | INTRAMUSCULAR | Status: DC | PRN
  Administered 2019-10-29 (×3): 4 mg via INTRAVENOUS
  Filled 2019-10-28 (×3): qty 2

## 2019-10-28 MED ORDER — HEPARIN SODIUM (PORCINE) 5000 UNIT/ML IJ SOLN
5000.0000 [IU] | Freq: Three times a day (TID) | INTRAMUSCULAR | Status: DC
  Administered 2019-10-28 – 2019-10-31 (×8): 5000 [IU] via SUBCUTANEOUS
  Filled 2019-10-28 (×9): qty 1

## 2019-10-28 MED ORDER — ACETAMINOPHEN 325 MG PO TABS
650.0000 mg | ORAL_TABLET | Freq: Once | ORAL | Status: AC | PRN
  Administered 2019-10-28: 650 mg via ORAL
  Filled 2019-10-28: qty 2

## 2019-10-28 MED ORDER — SODIUM CHLORIDE 0.9 % IV SOLN
2.0000 g | Freq: Three times a day (TID) | INTRAVENOUS | Status: DC
  Administered 2019-10-29 – 2019-10-31 (×8): 2 g via INTRAVENOUS
  Filled 2019-10-28 (×9): qty 2

## 2019-10-28 MED ORDER — SODIUM CHLORIDE 0.9 % IV SOLN
INTRAVENOUS | Status: AC
  Administered 2019-10-28: 21:00:00 via INTRAVENOUS

## 2019-10-28 MED ORDER — SODIUM CHLORIDE 0.9 % IV SOLN
2.0000 g | Freq: Once | INTRAVENOUS | Status: AC
  Administered 2019-10-28: 2 g via INTRAVENOUS
  Filled 2019-10-28: qty 2

## 2019-10-28 MED ORDER — VANCOMYCIN HCL 10 G IV SOLR
2000.0000 mg | Freq: Once | INTRAVENOUS | Status: AC
  Administered 2019-10-28: 2000 mg via INTRAVENOUS
  Filled 2019-10-28: qty 2000

## 2019-10-28 MED ORDER — ACETAMINOPHEN 325 MG PO TABS: 650.0000 mg | ORAL_TABLET | Freq: Four times a day (QID) | ORAL | Status: DC | PRN

## 2019-10-28 MED ORDER — METRONIDAZOLE IN NACL 5-0.79 MG/ML-% IV SOLN
500.0000 mg | Freq: Three times a day (TID) | INTRAVENOUS | Status: DC
  Administered 2019-10-29 (×2): 500 mg via INTRAVENOUS
  Filled 2019-10-28 (×2): qty 100

## 2019-10-28 MED ORDER — SODIUM CHLORIDE 0.9 % IV BOLUS (SEPSIS)
1000.0000 mL | Freq: Once | INTRAVENOUS | Status: AC
  Administered 2019-10-28: 1000 mL via INTRAVENOUS

## 2019-10-28 MED ORDER — CHLORHEXIDINE GLUCONATE CLOTH 2 % EX PADS
6.0000 | MEDICATED_PAD | Freq: Every day | CUTANEOUS | Status: DC
  Administered 2019-10-29 – 2019-10-31 (×3): 6 via TOPICAL

## 2019-10-28 MED ORDER — ACETAMINOPHEN 650 MG RE SUPP: 650.0000 mg | Freq: Four times a day (QID) | RECTAL | Status: DC | PRN

## 2019-10-28 MED ORDER — INSULIN ASPART 100 UNIT/ML ~~LOC~~ SOLN
0.0000 [IU] | Freq: Three times a day (TID) | SUBCUTANEOUS | Status: DC
  Administered 2019-10-29: 2 [IU] via SUBCUTANEOUS
  Administered 2019-10-29: 5 [IU] via SUBCUTANEOUS
  Administered 2019-10-29: 3 [IU] via SUBCUTANEOUS
  Administered 2019-10-30 (×2): 2 [IU] via SUBCUTANEOUS
  Administered 2019-10-30 – 2019-10-31 (×2): 1 [IU] via SUBCUTANEOUS
  Filled 2019-10-28: qty 0.09

## 2019-10-28 MED ORDER — VANCOMYCIN HCL 10 G IV SOLR
1250.0000 mg | INTRAVENOUS | Status: DC
  Filled 2019-10-28: qty 1250

## 2019-10-28 NOTE — Progress Notes (Signed)
Pharmacy Antibiotic Note  Gregory Cummings is a 73 y.o. male admitted on 10/28/2019 with sepsis.  Pharmacy has been consulted for vanc/cefepime dosing.  Plan: 1) Vanc 2g x 1 then 1250mg  IV q24 - goal AUC 400-550 2) Cefeime 2g IV q8 3) Narrow abx's as soon as clinically appropriate 4) Daily SCr  Height: 5\' 9"  (175.3 cm) Weight: 217 lb (98.4 kg) IBW/kg (Calculated) : 70.7  Temp (24hrs), Avg:101.6 F (38.7 C), Min:99.9 F (37.7 C), Max:103.2 F (39.6 C)  Recent Labs  Lab 10/28/19 1732  WBC 7.4  CREATININE 1.09  LATICACIDVEN 1.5    Estimated Creatinine Clearance: 67.7 mL/min (by C-G formula based on SCr of 1.09 mg/dL).    Allergies  Allergen Reactions  . No Known Allergies      Thank you for allowing pharmacy to be a part of this patient's care.  Kara Mead 10/28/2019 8:21 PM
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
## 2019-10-28 NOTE — ED Notes (Addendum)
Attempted to call report to [place], Therapist, sports. Will try again. MD requests that patient stay a PUI.
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
## 2019-10-28 NOTE — Progress Notes (Signed)
A consult was received from an ED physician for cefepime per pharmacy dosing.  The patient's profile has been reviewed for ht/wt/allergies/indication/available labs.   A one time order has been placed for cefepime 2g.  Further antibiotics/pharmacy consults should be ordered by admitting physician if indicated.                       Thank you, Kara Mead 10/28/2019  5:50 PM
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
## 2019-10-28 NOTE — ED Triage Notes (Signed)
Patient arrived via POV with wife at bedside. Patient is AOx4 and ambulatory with assistance due to weakness. Patient was instructed to to come to ED due to fowl smelling urine, possible infection r/t stent placement for urostomy bag per patient, and fatigue with fever. Patient is cancer patient and received last dose of Chemo w/o radiation in august of this year.
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
## 2019-10-28 NOTE — ED Notes (Signed)
Report given to Erin, RN

## 2019-10-29 ENCOUNTER — Inpatient Hospital Stay (HOSPITAL_COMMUNITY): Payer: Medicare Other

## 2019-10-29 DIAGNOSIS — E119 Type 2 diabetes mellitus without complications: Secondary | ICD-10-CM

## 2019-10-29 DIAGNOSIS — R197 Diarrhea, unspecified: Secondary | ICD-10-CM

## 2019-10-29 DIAGNOSIS — I10 Essential (primary) hypertension: Secondary | ICD-10-CM

## 2019-10-29 DIAGNOSIS — R6520 Severe sepsis without septic shock: Secondary | ICD-10-CM

## 2019-10-29 LAB — HEMOGLOBIN A1C
Hgb A1c MFr Bld: 6.7 % — ABNORMAL HIGH (ref 4.8–5.6)
Mean Plasma Glucose: 145.59 mg/dL

## 2019-10-29 LAB — CREATININE, SERUM
Creatinine, Ser: 1.04 mg/dL (ref 0.61–1.24)
GFR calc Af Amer: >60 mL/min (ref >60)
GFR calc non Af Amer: >60 mL/min (ref >60)

## 2019-10-29 LAB — GLUCOSE, CAPILLARY
Glucose-Capillary: 212 mg/dL — ABNORMAL HIGH (ref 70–99)
Glucose-Capillary: 259 mg/dL — ABNORMAL HIGH (ref 70–99)
Glucose-Capillary: 197 mg/dL — ABNORMAL HIGH (ref 70–99)
Glucose-Capillary: 168 mg/dL — ABNORMAL HIGH (ref 70–99)

## 2019-10-29 LAB — C DIFFICILE QUICK SCREEN W PCR REFLEX
C Diff antigen: NEGATIVE
C Diff interpretation: NOT DETECTED
C Diff toxin: NEGATIVE

## 2019-10-29 LAB — C-REACTIVE PROTEIN: CRP: 8.8 mg/dL — ABNORMAL HIGH (ref <1.0)

## 2019-10-29 LAB — D-DIMER, QUANTITATIVE: D-Dimer, Quant: 8.43 ug/mL-FEU — ABNORMAL HIGH (ref 0.00–0.50)

## 2019-10-29 LAB — SARS CORONAVIRUS 2 (TAT 6-24 HRS): SARS Coronavirus 2: NEGATIVE

## 2019-10-29 LAB — FERRITIN: Ferritin: 288 ng/mL (ref 24–336)

## 2019-10-29 MED ORDER — INSULIN GLARGINE 100 UNIT/ML ~~LOC~~ SOLN
20.0000 [IU] | Freq: Every day | SUBCUTANEOUS | Status: DC
  Administered 2019-10-29 – 2019-10-31 (×3): 20 [IU] via SUBCUTANEOUS
  Filled 2019-10-29 (×3): qty 0.2

## 2019-10-29 MED ORDER — IOHEXOL 300 MG/ML  SOLN
100.0000 mL | Freq: Once | INTRAMUSCULAR | Status: AC | PRN
  Administered 2019-10-29: 100 mL via INTRAVENOUS

## 2019-10-29 MED ORDER — SODIUM CHLORIDE 0.9% FLUSH
10.0000 mL | INTRAVENOUS | Status: DC | PRN
  Administered 2019-10-31: 10 mL
  Filled 2019-10-29: qty 40

## 2019-10-29 MED ORDER — SODIUM CHLORIDE (PF) 0.9 % IJ SOLN
INTRAMUSCULAR | Status: AC
  Filled 2019-10-29: qty 50

## 2019-10-29 MED ORDER — IOHEXOL 300 MG/ML  SOLN
30.0000 mL | Freq: Once | INTRAMUSCULAR | Status: AC | PRN
  Administered 2019-10-29: 30 mL via ORAL

## 2019-10-29 MED ORDER — FAMOTIDINE 20 MG PO TABS
20.0000 mg | ORAL_TABLET | Freq: Two times a day (BID) | ORAL | Status: DC | PRN
  Administered 2019-10-29: 20 mg via ORAL
  Filled 2019-10-29: qty 1

## 2019-10-29 MED ORDER — SODIUM CHLORIDE 0.9% FLUSH
10.0000 mL | Freq: Two times a day (BID) | INTRAVENOUS | Status: DC
  Administered 2019-10-29 – 2019-10-31 (×5): 10 mL

## 2019-10-29 MED ORDER — LOPERAMIDE HCL 2 MG PO CAPS
2.0000 mg | ORAL_CAPSULE | ORAL | Status: DC | PRN
  Administered 2019-10-29 – 2019-10-30 (×2): 2 mg via ORAL
  Filled 2019-10-29 (×3): qty 1

## 2019-10-29 MED ORDER — ALUM & MAG HYDROXIDE-SIMETH 200-200-20 MG/5ML PO SUSP
15.0000 mL | Freq: Four times a day (QID) | ORAL | Status: DC | PRN
  Administered 2019-10-29: 15 mL via ORAL
  Filled 2019-10-29: qty 30

## 2019-10-29 NOTE — Progress Notes (Signed)
PROGRESS NOTE    Gregory Cummings  [id number] DOB: 1946/06/20 DOA: 10/28/2019 PCP: [name], MD   Brief Narrative: Gregory Cummings is a 73 y.o. male with medical history significant of bladder cancer completed chemo few months ago, status post robot-assisted laparoscopic cystoprostatectomy with ileal conduit diversion and lymph node dissection on 10/10/2019 and underwent removal of stents about 4 days prior to admission, CAD status post PCI, type 2 diabetes, hypertension. Patient presented secondary to fever with concern for severe sepsis with urine source.   Assessment & Plan:   Principal Problem:   Sepsis ([place]) Active Problems:   DM2 (diabetes mellitus, type 2) ([place])   Suspected COVID-19 virus infection   HTN (hypertension)   Severe sepsis Present on admission. Associated hypotension that has responded to IV fluids. Presumed secondary to pyelonephritis in setting of recent cystoprostatectomy with ileal conduit diversion. COVID-19 negative with no specific respiratory symptoms. Started empirically on Vancomycin, cefepime and Flagyl. C. Difficile obtained and is negative. COVID-19 negative however he does have lymphocytopenia and procalcitonin is negative. No respiratory symptoms. -Discontinue Vancomycin and Flagyl -Continue Cefepime -Urine/blood cultures pending  Diarrhea Unknown etiology. Possibly related to antibiotics vs gastroenteritis. Some associated abdominal tenderness. No vomiting. C. Difficile obtained and is negative. -CT abdomen/pelvis -Imodium for now  Diabetes mellitus, insulin dependent Patient is on insulin regular as an outpatient using a sliding scale. -Continue SSI -Start Lantus 20 units daily while inpatient  Essential hypertension Patient on Carvedilol and lisinopril as an outpatient. Hypotensive on admission. -Hold antihypertensives for now  Hyperlipidemia On simvastatin as an outpatient   DVT prophylaxis: Heparin subq Code Status:   Code  Status: Full Code Family Communication: None at bedside; patient declined for me to call family Disposition Plan: Transfer to medical floor; discharge pending culture data   Consultants:   Urology  Procedures:   None  Antimicrobials:  Vancomycin (11/2>>  Cefepime (11/2>>  Flagyl (11/1>>    Subjective: Patient with recurrent watery diarrhea and intermittent abdominal pain.   Objective: Vitals:   10/29/19 0400 10/29/19 0445 10/29/19 0500 10/29/19 0600  BP: 139/68  (!) 117/45 (!) 123/56  Pulse: (!) 40  (!) 46 91  Resp:      Temp:  98.9 F (37.2 C)    TempSrc:  Oral    SpO2: 96%  95% 98%  Weight:      Height:        Intake/Output Summary (Last 24 hours) at 10/29/2019 0752 Last data filed at 10/29/2019 0443 Gross per 24 hour  Intake 4667.64 ml  Output 600 ml  Net 4067.64 ml   Filed Weights   10/28/19 1747 10/28/19 2255  Weight: 98.4 kg 97.5 kg    Examination:  General exam: Appears calm and comfortable Respiratory system: Clear to auscultation. Respiratory effort normal. Cardiovascular system: S1 & S2 heard, RRR. No murmurs, rubs, gallops or clicks. Gastrointestinal system: Abdomen is nondistended, soft and nontender. No organomegaly or masses felt. Normal bowel sounds heard. Urostomy bag with urine Central nervous system: Alert and oriented. No focal neurological deficits. Extremities: No edema. No calf tenderness Skin: No cyanosis. No rashes Psychiatry: Judgement and insight appear normal. Mood & affect appropriate.     Data Reviewed: I have personally reviewed following labs and imaging studies  CBC: Recent Labs  Lab 10/28/19 1732  WBC 7.4  NEUTROABS 5.9  HGB 10.6*  HCT 32.4*  MCV 97.0  PLT [id number]   Basic Metabolic Panel: Recent Labs  Lab 10/28/19 1732 10/29/19 0500  NA 133*  --  K 4.1  --   CL 99  --   CO2 26  --   GLUCOSE 102*  --   BUN 21  --   CREATININE 1.09 1.04  CALCIUM 9.3  --    GFR: Estimated Creatinine Clearance: 70.7  mL/min (by C-G formula based on SCr of 1.04 mg/dL). Liver Function Tests: Recent Labs  Lab 10/28/19 1732  AST 16  ALT 15  ALKPHOS 52  BILITOT 0.8  PROT 6.7  ALBUMIN 3.1*   No results for input(s): LIPASE, AMYLASE in the last 168 hours. No results for input(s): AMMONIA in the last 168 hours. Coagulation Profile: Recent Labs  Lab 10/28/19 1732  INR 1.0   Cardiac Enzymes: No results for input(s): CKTOTAL, CKMB, CKMBINDEX, TROPONINI in the last 168 hours. BNP (last 3 results) No results for input(s): PROBNP in the last 8760 hours. HbA1C: No results for input(s): HGBA1C in the last 72 hours. CBG: Recent Labs  Lab 10/28/19 2330  GLUCAP 146*   Lipid Profile: No results for input(s): CHOL, HDL, LDLCALC, TRIG, CHOLHDL, LDLDIRECT in the last 72 hours. Thyroid Function Tests: No results for input(s): TSH, T4TOTAL, FREET4, T3FREE, THYROIDAB in the last 72 hours. Anemia Panel: No results for input(s): VITAMINB12, FOLATE, FERRITIN, TIBC, IRON, RETICCTPCT in the last 72 hours. Sepsis Labs: Recent Labs  Lab 10/28/19 1732 10/28/19 2016  PROCALCITON  --  <0.10  LATICACIDVEN 1.5  --     Recent Results (from the past 240 hour(s))  Culture, blood (routine x 2)     Status: None (Preliminary result)   Collection Time: 10/28/19  5:33 PM   Specimen: BLOOD  Result Value Ref Range Status   Specimen Description   Final    BLOOD BLOOD LEFT FOREARM Performed at [facility] [address], [place], [place] [address]    Special Requests   Final    BOTTLES DRAWN AEROBIC AND ANAEROBIC Blood Culture results may not be optimal due to an excessive volume of blood received in culture bottles Performed at [facility] [address], [place], [place] [address]    Culture   Final    NO GROWTH < 12 HOURS Performed at [facility] [address] [place], [place] [address]    Report Status PENDING  Incomplete  Culture, blood (routine x 2)      Status: None (Preliminary result)   Collection Time: 10/28/19  5:38 PM   Specimen: BLOOD  Result Value Ref Range Status   Specimen Description   Final    BLOOD LEFT UPPERARM Performed at [facility] [address], [place], [place] [address]    Special Requests   Final    BOTTLES DRAWN AEROBIC AND ANAEROBIC Blood Culture results may not be optimal due to an excessive volume of blood received in culture bottles Performed at [facility] [address] [place], [place] [address]    Culture   Final    NO GROWTH < 12 HOURS Performed at [facility] [address], [place],  [address]    Report Status PENDING  Incomplete  SARS CORONAVIRUS 2 (TAT 6-24 HRS) Nasopharyngeal Nasopharyngeal Swab     Status: None   Collection Time: 10/28/19  8:58 PM   Specimen: Nasopharyngeal Swab  Result Value Ref Range Status   SARS Coronavirus 2 NEGATIVE NEGATIVE Final    Comment: (NOTE) SARS-CoV-2 target nucleic acids are NOT DETECTED. The SARS-CoV-2 RNA is generally detectable  in upper and lower respiratory specimens during the acute phase of infection. Negative results do not preclude SARS-CoV-2 infection, do not rule out co-infections with other pathogens, and should not be used as the sole basis for treatment or other patient management decisions. Negative results must be combined with clinical observations, patient history, and epidemiological information. The expected result is Negative. Fact Sheet for Patients: SugarRoll.be Fact Sheet for Healthcare Providers: https://www.woods-mathews.com/ This test is not yet approved or cleared by the [place] FDA and  has been authorized for detection and/or diagnosis of SARS-CoV-2 by FDA under an Emergency Use Authorization (EUA). This EUA will remain  in effect (meaning this test can be used) for the duration of the COVID-19 declaration under Section 56  4(b)(1) of the Act, 21 U.S.C. section 360bbb-3(b)(1), unless the authorization is terminated or revoked sooner. Performed at [facility], [place] [address] [place], [place] [address]          Radiology Studies: Dg Chest Port 1 View  Result Date: 10/28/2019 CLINICAL DATA:  Fever.  Fatigue.  Bladder carcinoma. EXAM: PORTABLE CHEST 1 VIEW COMPARISON:  06/30/2015 FINDINGS: Heart size is normal. Right-sided Port-A-Cath is seen in appropriate position. Both lungs are clear. IMPRESSION: No active disease. Electronically Signed   By: Marlaine Hind M.D.   On: 10/28/2019 18:10        Scheduled Meds: . Chlorhexidine Gluconate Cloth  6 each Topical Daily  . heparin  5,000 Units Subcutaneous Q8H  . insulin aspart  0-5 Units Subcutaneous QHS  . insulin aspart  0-9 Units Subcutaneous TID WC  . sodium chloride flush  10-40 mL Intracatheter Q12H   Continuous Infusions: . sodium chloride 150 mL/hr at 10/29/19 0300  . ceFEPime (MAXIPIME) IV Stopped (10/29/19 0200)  . metronidazole Stopped (10/29/19 0603)  . vancomycin       LOS: 1 day     Cordelia Poche, MD [facility] 10/29/2019, 7:52 AM  If 7PM-7AM, please contact night-coverage [email or web address]

## 2019-10-29 NOTE — Progress Notes (Signed)
Inpatient Diabetes Program Recommendations  AACE/ADA: New Consensus Statement on Inpatient Glycemic Control (2015)  Target Ranges:  Prepandial:   less than 140 mg/dL      Peak postprandial:   less than 180 mg/dL (1-2 hours)      Critically ill patients:  140 - 180 mg/dL   Results for Gregory Cummings, Gregory "BEN" (MRN [id number]) as of 10/29/2019 16:01  Ref. Range 10/28/2019 23:30 10/29/2019 09:02 10/29/2019 12:10  Glucose-Capillary Latest Ref Range: 70 - 99 mg/dL 146 (H) 212 (H)  3 units NOVOLOG  259 (H)  5 units NOVOLOG    Results for Gregory Cummings, Gregory "BEN" (MRN [id number]) as of 10/29/2019 16:01  Ref. Range 10/12/2019 16:55  Hemoglobin A1C Latest Ref Range: 4.8 - 5.6 % 6.6 (H)    Admit with: Sepsis / Pyelonephritis  History: DM, Bladder Cancer (chemo), status post robot-assisted laparoscopic cystoprostatectomy with ileal conduit diversion and lymph node dissection on 10/10/2019  Home DM Meds: Regular Insulin 30-100 units TID per SSI  Current Orders: Lantus 20 units Daily      Novolog Sensitive Correction Scale/ SSI (0-9 units) TID AC + HS      Note Lantus 20 units daily to start this afternoon.  Novolog SSi started this AM.  Agree with addition of Lantus.  Will try to see pt tomorrow to discuss home insulin regimen.    --Will follow patient during hospitalization--  Wyn Quaker RN, MSN, CDE Diabetes Coordinator Inpatient Glycemic Control Team Team Pager: [phone] (8a-5p)

## 2019-10-30 LAB — GLUCOSE, CAPILLARY
Glucose-Capillary: 159 mg/dL — ABNORMAL HIGH (ref 70–99)
Glucose-Capillary: 179 mg/dL — ABNORMAL HIGH (ref 70–99)
Glucose-Capillary: 131 mg/dL — ABNORMAL HIGH (ref 70–99)
Glucose-Capillary: 92 mg/dL (ref 70–99)

## 2019-10-30 LAB — COMPREHENSIVE METABOLIC PANEL
ALT: 12 U/L (ref 0–44)
AST: 12 U/L — ABNORMAL LOW (ref 15–41)
Albumin: 2.6 g/dL — ABNORMAL LOW (ref 3.5–5.0)
Alkaline Phosphatase: 42 U/L (ref 38–126)
Anion gap: 7 (ref 5–15)
BUN: 12 mg/dL (ref 8–23)
CO2: 22 mmol/L (ref 22–32)
Calcium: 8.3 mg/dL — ABNORMAL LOW (ref 8.9–10.3)
Chloride: 104 mmol/L (ref 98–111)
Creatinine, Ser: 0.93 mg/dL (ref 0.61–1.24)
GFR calc Af Amer: >60 mL/min (ref >60)
GFR calc non Af Amer: >60 mL/min (ref >60)
Glucose, Bld: 146 mg/dL — ABNORMAL HIGH (ref 70–99)
Potassium: 3.6 mmol/L (ref 3.5–5.1)
Sodium: 133 mmol/L — ABNORMAL LOW (ref 135–145)
Total Bilirubin: 0.5 mg/dL (ref 0.3–1.2)
Total Protein: 5.9 g/dL — ABNORMAL LOW (ref 6.5–8.1)

## 2019-10-30 LAB — FERRITIN: Ferritin: 306 ng/mL (ref 24–336)

## 2019-10-30 LAB — URINE CULTURE: Culture: >=100000 — AB

## 2019-10-30 LAB — CBC
HCT: 32 % — ABNORMAL LOW (ref 39.0–52.0)
Hemoglobin: 9.8 g/dL — ABNORMAL LOW (ref 13.0–17.0)
MCH: 31.6 pg (ref 26.0–34.0)
MCHC: 30.6 g/dL (ref 30.0–36.0)
MCV: 103.2 fL — ABNORMAL HIGH (ref 80.0–100.0)
Platelets: 199 10*3/uL (ref 150–400)
RBC: 3.1 MIL/uL — ABNORMAL LOW (ref 4.22–5.81)
RDW: 13 % (ref 11.5–15.5)
WBC: 5.4 10*3/uL (ref 4.0–10.5)
nRBC: 0.4 % — ABNORMAL HIGH (ref 0.0–0.2)

## 2019-10-30 LAB — D-DIMER, QUANTITATIVE: D-Dimer, Quant: 7.54 ug/mL-FEU — ABNORMAL HIGH (ref 0.00–0.50)

## 2019-10-30 LAB — C-REACTIVE PROTEIN: CRP: 7.9 mg/dL — ABNORMAL HIGH (ref <1.0)

## 2019-10-30 MED ORDER — INSULIN ASPART 100 UNIT/ML ~~LOC~~ SOLN
4.0000 [IU] | Freq: Three times a day (TID) | SUBCUTANEOUS | Status: DC
  Administered 2019-10-30 – 2019-10-31 (×4): 4 [IU] via SUBCUTANEOUS

## 2019-10-30 NOTE — Progress Notes (Signed)
Subjective/Chief Complaint:  1- Muscle Invasive Bladder Cancer - s/p robotic cystoprostatectomy with ICG sentinal + template node dissection 10/10/2019 for pT2N0Mx bladder cancer with NEGATIVE margins. Completed neo-adjuvant chemo (4 cycles gem-cis under care of shadad) prior.   2 - Urinary Diversion - s/p conduit diversion with bricker (refluxing) ureteral anastamoses at cystectomy 09/2019.  3 - Suspect Urosepsis / Inadvertent Stent Pull - pt pulled bilateral ureteral stents 11/1 after reading online and against our advice (supposed to stay in 4-5 weeks longer) and without proph antibiotics. UCX 11/1 [place] / pending. [place] 11/1 - NGTD. Placed on empiric cefipime. CT 11/2 w/o sig hydro / fluid collections. C19 negative, CDiff negative.    Today " [name]" is stable. NO high grade fevers. Cr normal. CT yesterday favorable.    Objective: Vital signs in last 24 hours: Temp:  [98.3 F (36.8 C)-99.3 F (37.4 C)] 99.3 F (37.4 C) (11/03 1200) Pulse Rate:  [70-85] 78 (11/03 1615) Resp:  [0-24] 22 (11/03 1615) BP: (114-154)/(59-82) 117/72 (11/03 1615) SpO2:  [94 %-100 %] 98 % (11/03 1615) Last BM Date: 10/30/19  Intake/Output from previous day: 11/02 0701 - 11/03 0700 In: 849.6 [P.O.:240; I.V.:312.2; IV Piggyback:297.5] Out: 2075 [Urine:2075] Intake/Output this shift: Total I/O In: 102.6 [IV Piggyback:102.6] Out: 1 [Stool:1]  General appearance: alert, cooperative, appears stated age and at baseline  Eyes: negative Nose: Nares normal. Septum midline. Mucosa normal. No drainage or sinus tenderness. Throat: lips, mucosa, and tongue normal; teeth and gums normal Neck: supple, symmetrical, trachea midline Back: symmetric, no curvature. ROM normal. No CVA tenderness. Resp: non-labored on minimal [place] O2. Cardio: Nl rate GI: soft, non-tender; bowel sounds normal; no masses,  no organomegaly Male genitalia: normal Extremities: extremities normal, atraumatic, no cyanosis or  edema Skin: Skin color, texture, turgor normal. No rashes or lesions Lymph nodes: Cervical, supraclavicular, and axillary nodes normal. Neurologic: Grossly normal  RLQ Urostomy pink / patent with copious non-foul urine and scant mucus. Recent scars w/o hernias or drainage.   Lab Results:  Recent Labs    10/28/19 1732 10/30/19 0528  WBC 7.4 5.4  HGB 10.6* 9.8*  HCT 32.4* 32.0*  PLT 295 199   BMET Recent Labs    10/28/19 1732 10/29/19 0500 10/30/19 0528  NA 133*  --  133*  K 4.1  --  3.6  CL 99  --  104  CO2 26  --  22  GLUCOSE 102*  --  146*  BUN 21  --  12  CREATININE 1.09 1.04 0.93  CALCIUM 9.3  --  8.3*   PT/INR Recent Labs    10/28/19 1732  LABPROT 13.2  INR 1.0   ABG No results for input(s): PHART, HCO3 in the last 72 hours.  Invalid input(s): PCO2, PO2  Studies/Results: Ct Abdomen Pelvis W Contrast  Result Date: 10/29/2019 CLINICAL DATA:  Diarrhea with fever of unknown origin. History of urothelial carcinoma status post laparoscopic Cysto prostatectomy with ileal conduit diversion. EXAM: CT ABDOMEN AND PELVIS WITH CONTRAST TECHNIQUE: Multidetector CT imaging of the abdomen and pelvis was performed using the standard protocol following bolus administration of intravenous contrast. CONTRAST:  147mL OMNIPAQUE IOHEXOL 300 MG/ML SOLN, 4mL OMNIPAQUE IOHEXOL 300 MG/ML SOLN COMPARISON:  08/28/2019 FINDINGS: Lower chest: Dependent changes are identified within both lung bases. Trace pleural effusions noted right greater than left. Hepatobiliary: No focal liver abnormality identified. Gallbladder is collapsed. Pancreas: Unremarkable. No pancreatic ductal dilatation or surrounding inflammatory changes. Spleen: Normal in size without focal abnormality. Adrenals/Urinary Tract:  Normal appearance of the adrenal glands. Right kidney cysts measures 1.5 cm, image 33/2. Mild bilateral hydronephrosis identified. Duplicated left renal collecting system. Status post Cysto prostatectomy  with right abdominal diverting urostomy. Stomach/Bowel: Stomach normal. No dilated loops of small or large bowel identified. Within the right lower quadrant of the abdomen in the region of the loop ileostomy there are several loops of small bowel which exhibit mild wall thickening/inflammation. Dilute contrast is identified within the cecum compatible with small bowel patency. No abnormal large bowel wall thickening, inflammation or distension. Vascular/Lymphatic: Aortic atherosclerosis without aneurysm. No abdominal adenopathy. No iliac adenopathy. Prominent left inguinal lymph node measures 9 mm short axis. Reproductive: Status post prostatectomy. Other: There is free fluid identified within the abdomen and pelvis. No discrete fluid collection identified. Free fluid extends over the scratch set a small amount of perihepatic free fluid is noted between the dome and right hemidiaphragm. Subcutaneous gas is identified within bilateral inguinal regions, image 91/2 and image 88/2. There is also subcutaneous gas within the abdominal wall bilaterally. Musculoskeletal: No acute or significant osseous findings. IMPRESSION: 1. Status post Cysto prostatectomy with right abdominal diverting loop ileostomy. 2. There are several loops of small bowel within the right lower quadrant of the abdomen in the region of the loop ileostomy which exhibits mild wall thickening/inflammation. Findings may reflect an inflammatory or infectious enteritis. No evidence for bowel obstruction. 3. There is a small amount of free fluid identified within the abdomen and pelvis. No discrete fluid collection identified. 4. Mild bilateral hydronephrosis. No striated nephrograms identified. No renal abscess noted. 5. Subcutaneous gas within the abdominal wall in bilateral inguinal regions may be related to recent surgery. 6. Aortic atherosclerosis. Aortic Atherosclerosis (ICD10-I70.0). Electronically Signed   By: Kerby Moors M.D.   On: 10/29/2019  17:30   Dg Chest Port 1 View  Result Date: 10/28/2019 CLINICAL DATA:  Fever.  Fatigue.  Bladder carcinoma. EXAM: PORTABLE CHEST 1 VIEW COMPARISON:  06/30/2015 FINDINGS: Heart size is normal. Right-sided Port-A-Cath is seen in appropriate position. Both lungs are clear. IMPRESSION: No active disease. Electronically Signed   By: Marlaine Hind M.D.   On: 10/28/2019 18:10    Anti-infectives: Anti-infectives (From admission, onward)   Start     Dose/Rate Route Frequency Ordered Stop   10/29/19 2200  vancomycin (VANCOCIN) 1,250 mg in sodium chloride 0.9 % 250 mL IVPB  Status:  Discontinued     1,250 mg 166.7 mL/hr over 90 Minutes Intravenous Every 24 hours 10/28/19 2024 10/29/19 1441   10/29/19 0200  ceFEPIme (MAXIPIME) 2 g in sodium chloride 0.9 % 100 mL IVPB     2 g 200 mL/hr over 30 Minutes Intravenous Every 8 hours 10/28/19 2024     10/28/19 2200  metroNIDAZOLE (FLAGYL) IVPB 500 mg  Status:  Discontinued     500 mg 100 mL/hr over 60 Minutes Intravenous Every 8 hours 10/28/19 2015 10/29/19 1441   10/28/19 2030  vancomycin (VANCOCIN) 2,000 mg in sodium chloride 0.9 % 500 mL IVPB     2,000 mg 250 mL/hr over 120 Minutes Intravenous  Once 10/28/19 2024 10/29/19 0128   10/28/19 1800  ceFEPIme (MAXIPIME) 2 g in sodium chloride 0.9 % 100 mL IVPB     2 g 200 mL/hr over 30 Minutes Intravenous  Once 10/28/19 1746 10/28/19 2053      Assessment/Plan:  1- Muscle Invasive Bladder Cancer - excellent long term prognosis. NO further cancer directed therapy at this point.   2 - Urinary  Diversion - conduit functioning well.   3 - Suspect Urosepsis / Inadvertent Stent Pull - agree with current ABX pending additional CX data. Rec fever free x 24 hours and prelim CX data before DC.   Please call me diretly with questions anytime.   Pt has GU follow up on 11/05/19. Hopefully he will be OK before DC prior.  Alexis Frock 10/30/2019

## 2019-10-30 NOTE — Progress Notes (Signed)
Inpatient Diabetes Program Recommendations  AACE/ADA: New Consensus Statement on Inpatient Glycemic Control (2015)  Target Ranges:  Prepandial:   less than 140 mg/dL      Peak postprandial:   less than 180 mg/dL (1-2 hours)      Critically ill patients:  140 - 180 mg/dL   Results for Gregory Cummings, Gregory "BEN" (MRN [id number]) as of 10/30/2019 12:54  Ref. Range 10/29/2019 09:02 10/29/2019 12:10 10/29/2019 16:14 10/29/2019 21:27  Glucose-Capillary Latest Ref Range: 70 - 99 mg/dL 212 (H)  3 units NOVOLOG  259 (H)  5 units NOVOLOG  197 (H)  2 units NOVOLOG +  20 units LANTUS  168 (H)   Results for Gregory Cummings, Gregory "BEN" (MRN [id number]) as of 10/30/2019 12:54  Ref. Range 10/30/2019 07:59 10/30/2019 11:24  Glucose-Capillary Latest Ref Range: 70 - 99 mg/dL 159 (H)  6 units NOVOLOG  179 (H)  6 units NOVOLOG +  20 units LANTUS      Home DM Meds: Regular Insulin 30-100 units TID per SSI  Current Orders: Lantus 20 units Daily                            Novolog Sensitive Correction Scale/ SSI (0-9 units) TID AC + HS      Novolog 4 units TID with meals     Met with patient today to discuss home insulin regimen.  Patient stated he has been taking the Regular Insulin TID for many years now.  Sees Dr. Felipa Eth for diabetes management.  Discussed with pt that it is unusual for a patient to take large doses of Regular Insulin without a basal insulin like Lantus as well.  Discussed how Lantus works and how it helps the body to utilize glucose made form the liver.  Pt expressed interest in getting a Rx for Lantus at time of d/c home.  Would like to continue his Regular insulin as well if needed.  Not really interested in switching to Novolog.     MD- If you decide to send patient home on Lantus, please give patient Rx for the Lantus vial.  Patient already using vials of Regular at home.  If you send pt home with Lantus, he will likely need a modified Sliding scale to follow with his Regular  Insulin as he has been taking anywhere between 30-100 units of Regular and will likely not need as much Regular Insulin if he goes home on Lantus as well.     --Will follow patient during hospitalization--  Wyn Quaker RN, MSN, CDE Diabetes Coordinator Inpatient Glycemic Control Team Team Pager: [phone] (8a-5p)

## 2019-10-30 NOTE — Progress Notes (Signed)
Report called to receiving RN [id number]. Will report off to [facility] and have her transfer patient to new room.

## 2019-10-30 NOTE — Progress Notes (Signed)
PROGRESS NOTE    Gregory Cummings  [id number] DOB: [birth year] DOA: 10/28/2019 PCP: [name], MD   Brief Narrative: Gregory Cummings is a 73 y.o. male with medical history significant of bladder cancer completed chemo few months ago, status post robot-assisted laparoscopic cystoprostatectomy with ileal conduit diversion and lymph node dissection on 10/10/2019 and underwent removal of stents about 4 days prior to admission, CAD status post PCI, type 2 diabetes, hypertension. Patient presented secondary to fever with concern for severe sepsis with urine source.   Assessment & Plan:   Principal Problem:   Sepsis ([place]) Active Problems:   DM2 (diabetes mellitus, type 2) ([place])   Suspected COVID-19 virus infection   HTN (hypertension)   Severe sepsis Present on admission. Associated hypotension that has responded to IV fluids. Presumed secondary to pyelonephritis in setting of recent cystoprostatectomy with ileal conduit diversion however, no evidence of pyelonephritis on CT scan. There does appear to be evidence of enteritis. COVID-19 negative with no specific respiratory symptoms. Started empirically on Vancomycin, cefepime and Flagyl. C. Difficile obtained and is negative. COVID-19 negative however he does have lymphocytopenia and procalcitonin is negative. No respiratory symptoms. Urine culture significant for GNR. Blood culture still pending. -Continue Cefepime -Urine/blood cultures pending  Diarrhea Unknown etiology. Possibly related to antibiotics vs gastroenteritis. Some associated abdominal tenderness. No vomiting. C. Difficile obtained and is negative. CT scan significant for enteritis. -Imodium prn  Diabetes mellitus, insulin dependent Patient is on insulin regular as an outpatient using a sliding scale, 30-100 units. Fasting blood sugar is adequate this morning -Continue SSI -Continue Lantus 20 units daily while inpatient -Add Novolog 4 units TID with meals  Essential  hypertension Patient on Carvedilol and lisinopril as an outpatient. Hypotensive on admission which has improved. -Hold antihypertensives for now  Hyperlipidemia On simvastatin as an outpatient   DVT prophylaxis: Heparin subq Code Status:   Code Status: Full Code Family Communication: None at bedside; patient declined for me to call family Disposition Plan: Transfer to medical floor; discharge pending culture data   Consultants:   Urology  Procedures:   None  Antimicrobials:  Vancomycin (11/2>>  Cefepime (11/2>>  Flagyl (11/1>>    Subjective: No abdominal pain. Still with frequent stools. Some nausea. No emesis.  Objective: Vitals:   10/30/19 0300 10/30/19 0400 10/30/19 0500 10/30/19 0600  BP: 132/80 136/77 122/66 130/75  Pulse: 84 74 78 77  Resp: 13 15 20 13   Temp:  98.5 F (36.9 C)    TempSrc:  Axillary    SpO2: 98% 97% 98% 97%  Weight:      Height:        Intake/Output Summary (Last 24 hours) at 10/30/2019 0727 Last data filed at 10/30/2019 0618 Gross per 24 hour  Intake 849.63 ml  Output 2075 ml  Net -1225.37 ml   Filed Weights   10/28/19 1747 10/28/19 2255  Weight: 98.4 kg 97.5 kg    Examination:  General exam: Appears calm and comfortable Respiratory system: Clear to auscultation. Respiratory effort normal. Cardiovascular system: S1 & S2 heard, RRR. No murmurs, rubs, gallops or clicks. Gastrointestinal system: Abdomen is nondistended, soft and non-tender. Urostomy bag with small volume of urine. No organomegaly or masses felt. Normal bowel sounds heard. Central nervous system: Alert and oriented. No focal neurological deficits. Extremities: No edema. No calf tenderness Skin: No cyanosis. No rashes Psychiatry: Judgement and insight appear normal. Mood & affect appropriate.      Data Reviewed: I have personally reviewed following labs and imaging  studies  CBC: Recent Labs  Lab 10/28/19 1732 10/30/19 0528  WBC 7.4 5.4  NEUTROABS 5.9  --    HGB 10.6* 9.8*  HCT 32.4* 32.0*  MCV 97.0 103.2*  PLT 295 [id number]   Basic Metabolic Panel: Recent Labs  Lab 10/28/19 1732 10/29/19 0500 10/30/19 0528  NA 133*  --  133*  K 4.1  --  3.6  CL 99  --  104  CO2 26  --  22  GLUCOSE 102*  --  146*  BUN 21  --  12  CREATININE 1.09 1.04 0.93  CALCIUM 9.3  --  8.3*   GFR: Estimated Creatinine Clearance: 79 mL/min (by C-G formula based on SCr of 0.93 mg/dL). Liver Function Tests: Recent Labs  Lab 10/28/19 1732 10/30/19 0528  AST 16 12*  ALT 15 12  ALKPHOS 52 42  BILITOT 0.8 0.5  PROT 6.7 5.9*  ALBUMIN 3.1* 2.6*   No results for input(s): LIPASE, AMYLASE in the last 168 hours. No results for input(s): AMMONIA in the last 168 hours. Coagulation Profile: Recent Labs  Lab 10/28/19 1732  INR 1.0   Cardiac Enzymes: No results for input(s): CKTOTAL, CKMB, CKMBINDEX, TROPONINI in the last 168 hours. BNP (last 3 results) No results for input(s): PROBNP in the last 8760 hours. HbA1C: Recent Labs    10/29/19 0500  HGBA1C 6.7*   CBG: Recent Labs  Lab 10/28/19 2330 10/29/19 0902 10/29/19 1210 10/29/19 1614 10/29/19 2127  GLUCAP 146* 212* 259* 197* 168*   Lipid Profile: No results for input(s): CHOL, HDL, LDLCALC, TRIG, CHOLHDL, LDLDIRECT in the last 72 hours. Thyroid Function Tests: No results for input(s): TSH, T4TOTAL, FREET4, T3FREE, THYROIDAB in the last 72 hours. Anemia Panel: Recent Labs    10/29/19 1457 10/30/19 0528  FERRITIN 288 306   Sepsis Labs: Recent Labs  Lab 10/28/19 1732 10/28/19 2016  PROCALCITON  --  <0.10  LATICACIDVEN 1.5  --     Recent Results (from the past 240 hour(s))  Culture, blood (routine x 2)     Status: None (Preliminary result)   Collection Time: 10/28/19  5:33 PM   Specimen: BLOOD  Result Value Ref Range Status   Specimen Description   Final    BLOOD BLOOD LEFT FOREARM Performed at [facility] [address] [place], [place] [address]    Special  Requests   Final    BOTTLES DRAWN AEROBIC AND ANAEROBIC Blood Culture results may not be optimal due to an excessive volume of blood received in culture bottles Performed at [facility] [address] [place], [place] [address]    Culture   Final    NO GROWTH < 12 HOURS Performed at [facility] [address], [place], [place] [address]    Report Status PENDING  Incomplete  Urine culture     Status: Abnormal (Preliminary result)   Collection Time: 10/28/19  5:33 PM   Specimen: Urine, Clean Catch  Result Value Ref Range Status   Specimen Description   Final    URINE, CLEAN CATCH Performed at [facility], [place] [address], [place], [place] [address]    Special Requests   Final    Immunocompromised Performed at [facility], [place] [address] [place], [place] [address]    Culture (A)  Final    >=100,000 COLONIES/mL GRAM NEGATIVE RODS IDENTIFICATION AND SUSCEPTIBILITIES TO FOLLOW Performed at [facility], [place],  [place] [address]    Report Status PENDING  Incomplete  Culture, blood (routine x 2)     Status: None (Preliminary result)   Collection Time: 10/28/19  5:38 PM   Specimen: BLOOD  Result Value Ref Range Status   Specimen Description   Final    BLOOD LEFT UPPERARM Performed at [facility] [address], [place], [place] [address]    Special Requests   Final    BOTTLES DRAWN AEROBIC AND ANAEROBIC Blood Culture results may not be optimal due to an excessive volume of blood received in culture bottles Performed at [facility] [address] [place], [place] [address]    Culture   Final    NO GROWTH < 12 HOURS Performed at [facility] [address] [place], [place] [address]    Report Status PENDING  Incomplete  SARS CORONAVIRUS 2 (TAT 6-24 HRS) Nasopharyngeal Nasopharyngeal Swab     Status: None   Collection Time: 10/28/19  8:58 PM    Specimen: Nasopharyngeal Swab  Result Value Ref Range Status   SARS Coronavirus 2 NEGATIVE NEGATIVE Final    Comment: (NOTE) SARS-CoV-2 target nucleic acids are NOT DETECTED. The SARS-CoV-2 RNA is generally detectable in upper and lower respiratory specimens during the acute phase of infection. Negative results do not preclude SARS-CoV-2 infection, do not rule out co-infections with other pathogens, and should not be used as the sole basis for treatment or other patient management decisions. Negative results must be combined with clinical observations, patient history, and epidemiological information. The expected result is Negative. Fact Sheet for Patients: SugarRoll.be Fact Sheet for Healthcare Providers: https://www.woods-mathews.com/ This test is not yet approved or cleared by the [place] FDA and  has been authorized for detection and/or diagnosis of SARS-CoV-2 by FDA under an Emergency Use Authorization (EUA). This EUA will remain  in effect (meaning this test can be used) for the duration of the COVID-19 declaration under Section 56 4(b)(1) of the Act, 21 U.S.C. section 360bbb-3(b)(1), unless the authorization is terminated or revoked sooner. Performed at [facility], [place] [address] [place], [place] [address]   C difficile quick scan w PCR reflex     Status: None   Collection Time: 10/29/19  9:33 AM   Specimen: STOOL  Result Value Ref Range Status   C Diff antigen NEGATIVE NEGATIVE Final   C Diff toxin NEGATIVE NEGATIVE Final   C Diff interpretation No C. difficile detected.  Final    Comment: Performed at [facility], [place] [address], [place], [place] [address]         Radiology Studies: Ct Abdomen Pelvis W Contrast  Result Date: 10/29/2019 CLINICAL DATA:  Diarrhea with fever of unknown origin. History of urothelial carcinoma status post laparoscopic Cysto prostatectomy with ileal conduit  diversion. EXAM: CT ABDOMEN AND PELVIS WITH CONTRAST TECHNIQUE: Multidetector CT imaging of the abdomen and pelvis was performed using the standard protocol following bolus administration of intravenous contrast. CONTRAST:  164mL OMNIPAQUE IOHEXOL 300 MG/ML SOLN, 43mL OMNIPAQUE IOHEXOL 300 MG/ML SOLN COMPARISON:  08/28/2019 FINDINGS: Lower chest: Dependent changes are identified within both lung bases. Trace pleural effusions noted right greater than left. Hepatobiliary: No focal liver abnormality identified. Gallbladder is collapsed. Pancreas: Unremarkable. No pancreatic ductal dilatation or surrounding inflammatory changes. Spleen: Normal in size without focal abnormality. Adrenals/Urinary Tract: Normal appearance of the adrenal glands. Right kidney cysts measures 1.5 cm, image 33/2. Mild bilateral hydronephrosis identified. Duplicated left  renal collecting system. Status post Cysto prostatectomy with right abdominal diverting urostomy. Stomach/Bowel: Stomach normal. No dilated loops of small or large bowel identified. Within the right lower quadrant of the abdomen in the region of the loop ileostomy there are several loops of small bowel which exhibit mild wall thickening/inflammation. Dilute contrast is identified within the cecum compatible with small bowel patency. No abnormal large bowel wall thickening, inflammation or distension. Vascular/Lymphatic: Aortic atherosclerosis without aneurysm. No abdominal adenopathy. No iliac adenopathy. Prominent left inguinal lymph node measures 9 mm short axis. Reproductive: Status post prostatectomy. Other: There is free fluid identified within the abdomen and pelvis. No discrete fluid collection identified. Free fluid extends over the scratch set a small amount of perihepatic free fluid is noted between the dome and right hemidiaphragm. Subcutaneous gas is identified within bilateral inguinal regions, image 91/2 and image 88/2. There is also subcutaneous gas within the  abdominal wall bilaterally. Musculoskeletal: No acute or significant osseous findings. IMPRESSION: 1. Status post Cysto prostatectomy with right abdominal diverting loop ileostomy. 2. There are several loops of small bowel within the right lower quadrant of the abdomen in the region of the loop ileostomy which exhibits mild wall thickening/inflammation. Findings may reflect an inflammatory or infectious enteritis. No evidence for bowel obstruction. 3. There is a small amount of free fluid identified within the abdomen and pelvis. No discrete fluid collection identified. 4. Mild bilateral hydronephrosis. No striated nephrograms identified. No renal abscess noted. 5. Subcutaneous gas within the abdominal wall in bilateral inguinal regions may be related to recent surgery. 6. Aortic atherosclerosis. Aortic Atherosclerosis (ICD10-I70.0). Electronically Signed   By: Kerby Moors M.D.   On: 10/29/2019 17:30   Dg Chest Port 1 View  Result Date: 10/28/2019 CLINICAL DATA:  Fever.  Fatigue.  Bladder carcinoma. EXAM: PORTABLE CHEST 1 VIEW COMPARISON:  06/30/2015 FINDINGS: Heart size is normal. Right-sided Port-A-Cath is seen in appropriate position. Both lungs are clear. IMPRESSION: No active disease. Electronically Signed   By: Marlaine Hind M.D.   On: 10/28/2019 18:10        Scheduled Meds:  Chlorhexidine Gluconate Cloth  6 each Topical Daily   heparin  5,000 Units Subcutaneous Q8H   insulin aspart  0-5 Units Subcutaneous QHS   insulin aspart  0-9 Units Subcutaneous TID WC   insulin aspart  4 Units Subcutaneous TID WC   insulin glargine  20 Units Subcutaneous Daily   sodium chloride flush  10-40 mL Intracatheter Q12H   Continuous Infusions:  ceFEPime (MAXIPIME) IV Stopped (10/30/19 0214)     LOS: 2 days     Cordelia Poche, MD [facility] 10/30/2019, 7:27 AM  If 7PM-7AM, please contact night-coverage [email or web address]

## 2019-10-31 DIAGNOSIS — N390 Urinary tract infection, site not specified: Secondary | ICD-10-CM

## 2019-10-31 DIAGNOSIS — Z20828 Contact with and (suspected) exposure to other viral communicable diseases: Secondary | ICD-10-CM

## 2019-10-31 LAB — GLUCOSE, CAPILLARY: Glucose-Capillary: 141 mg/dL — ABNORMAL HIGH (ref 70–99)

## 2019-10-31 LAB — CREATININE, SERUM
Creatinine, Ser: 0.34 mg/dL — ABNORMAL LOW (ref 0.61–1.24)
GFR calc Af Amer: >60 mL/min (ref >60)
GFR calc non Af Amer: >60 mL/min (ref >60)

## 2019-10-31 MED ORDER — CEFUROXIME AXETIL 500 MG PO TABS: 500.0000 mg | ORAL_TABLET | Freq: Two times a day (BID) | ORAL | 0 refills | Status: AC

## 2019-10-31 MED ORDER — INSULIN GLARGINE 100 UNIT/ML ~~LOC~~ SOLN: 20.0000 [IU] | Freq: Every day | SUBCUTANEOUS | 11 refills | Status: DC

## 2019-10-31 MED ORDER — HEPARIN SOD (PORK) LOCK FLUSH 100 UNIT/ML IV SOLN
500.0000 [IU] | INTRAVENOUS | Status: AC | PRN
  Administered 2019-10-31: 500 [IU]
  Filled 2019-10-31: qty 5

## 2019-10-31 NOTE — Care Management Important Message (Signed)
Important Message  Patient Details IM Letter given to Marney Doctor RN to present to the Patient Name: Gregory Cummings MRN: [id number] Date of Birth: 1946-11-05   Medicare Important Message Given:  Yes     [name] 10/31/2019, 10:28 AM

## 2019-10-31 NOTE — Discharge Summary (Addendum)
HOSPITAL DISCHARGE SUMMARY  Gregory Cummings  MRN: [id number]  DOB:Jul 19, 1946  Date of Admission: 10/28/2019 Date of Discharge: 10/31/2019         LOS: 3 days   Attending Physician:  Birdie Hopes  Patient's PCP:  [name], MD  Consults: Treatment Team:  Alexis Frock, MD    Brief Admission History: Gregory Cummings is a 73 y.o. malewith medical history significant ofbladder cancer completed chemo few months ago,status post robot-assisted laparoscopic cystoprostatectomy with ileal conduit diversion and lymph node dissection on 10/14/2020andunderwent removal of stents about 4 days prior to admission, CAD status post PCI, type 2 diabetes, hypertension. Patient presented secondary to fever with concern for severe sepsis with urine source.  Hospital Course: Discharge Diagnoses: Present on Admission: . Sepsis ([place])   Severe sepsis Present on admission. Associated hypotension that has responded to IV fluids.  This is likely secondary to pyelonephritis. Had recent cystoprostatectomy with a ureteral stent was still in place. Culture showed Klebsiella oxytoca, susceptible to Rocephin. Discharged on Ceftin to complete 14 days of antibiotics.  UTI Complicated UTI with presence of stents and recent surgery. As mentioned above code sepsis, discharged on Ceftin to complete 14 days of antibiotics.  Diarrhea Unknown etiology. Possibly related to antibiotics vs gastroenteritis. Some associated abdominal tenderness. No vomiting. C. Difficile obtained and is negative. CT scan significant for enteritis. This is resolved.  Diabetes mellitus, insulin dependent Patient is on insulin regular as an outpatient using a sliding scale, 30-100 units. Fasting blood sugar is adequate this morning -Continue SSI Started on Lantus 20 units daily, prescribed on discharge as well. -Add Novolog 4 units TID with meals  Essential hypertension Patient on Carvedilol and lisinopril as an outpatient.  Hypotensive on admission which has improved. -Hold antihypertensives for now  Hyperlipidemia On simvastatin as an outpatient   Allergies as of 10/31/2019      Reactions   No Known Allergies       Medication List    STOP taking these medications   phenazopyridine 200 MG tablet Commonly known as: Pyridium   prochlorperazine 10 MG tablet Commonly known as: COMPAZINE   traMADol 50 MG tablet Commonly known as: Ultram     TAKE these medications   acetaminophen 500 MG tablet Commonly known as: TYLENOL Take 500 mg by mouth every 6 (six) hours as needed for mild pain, moderate pain or headache.   aspirin 81 MG tablet Take 81 mg by mouth daily.   carvedilol 3.125 MG tablet Commonly known as: COREG Take 3.125 mg by mouth daily.   cefUROXime 500 MG tablet Commonly known as: Ceftin Take 1 tablet (500 mg total) by mouth 2 (two) times daily with a meal for 11 days.   famotidine 10 MG tablet Commonly known as: PEPCID Take 10 mg by mouth 2 (two) times daily as needed for heartburn or indigestion.   insulin glargine 100 UNIT/ML injection Commonly known as: LANTUS Inject 0.2 mLs (20 Units total) into the skin daily. Start taking on: November 01, 2019   insulin regular 100 units/mL injection Commonly known as: NOVOLIN R Inject 30-100 Units into the skin 3 (three) times daily before meals. Sliding Scale Depended on the glucose reading and meal plan for the next two hours The patient has a Libre device on his arm   lidocaine-prilocaine cream Commonly known as: EMLA Apply 1 application topically as needed.   lisinopril 5 MG tablet Commonly known as: ZESTRIL Take 1 tablet (5 mg total) by mouth daily. For future refills,  please discuss with your PCP.   ondansetron 8 MG tablet Commonly known as: ZOFRAN Take 8 mg by mouth 2 (two) times daily as needed for nausea or vomiting.   sennosides-docusate sodium 8.6-50 MG tablet Commonly known as: SENOKOT-S Take 1 tablet by mouth  daily.   simvastatin 20 MG tablet Commonly known as: ZOCOR Take 20 mg by mouth at bedtime.       Day of Discharge BP 126/76 (BP Location: Right Arm)   Pulse 74   Temp 98.2 F (36.8 C) (Oral)   Resp 16   Ht 5\' 9"  (1.753 m)   Wt 96.7 kg   SpO2 97%   BMI 31.48 kg/m  Physical Exam:  General: Alert, disoriented not in any acute distress.  HEENT: anicteric sclera, pupils equal reactive to light and accommodation  CVS: S1-S2 heard, no murmur rubs or gallops  Chest: clear to auscultation bilaterally, no wheezing rales or rhonchi  Abdomen: normal bowel sounds, soft, nontender, nondistended, no organomegaly  Neuro: Cranial nerves II-XII intact, no focal neurological deficits  Extremities: no cyanosis, no clubbing or edema noted bilaterally  Results for orders placed or performed during the hospital encounter of 10/28/19 (from the past 24 hour(s))  Glucose, capillary     Status: Abnormal   Collection Time: 10/30/19  4:49 PM  Result Value Ref Range   Glucose-Capillary 131 (H) 70 - 99 mg/dL  Glucose, capillary     Status: None   Collection Time: 10/30/19 10:02 PM  Result Value Ref Range   Glucose-Capillary 92 70 - 99 mg/dL   Comment 1 Notify RN    Comment 2 Document in Chart   Creatinine, serum     Status: Abnormal   Collection Time: 10/31/19  5:32 AM  Result Value Ref Range   Creatinine, Ser 0.34 (L) 0.61 - 1.24 mg/dL   GFR calc non Af Amer >60 >60 mL/min   GFR calc Af Amer >60 >60 mL/min  Glucose, capillary     Status: Abnormal   Collection Time: 10/31/19  7:26 AM  Result Value Ref Range   Glucose-Capillary 141 (H) 70 - 99 mg/dL   Comment 1 Notify RN    Comment 2 Document in Chart     Disposition: Home   Follow-up Appts: Discharge Instructions    Diet Carb Modified   Complete by: As directed    Increase activity slowly   Complete by: As directed       Follow-up Information    Alexis Frock, MD On 11/05/2019.   Specialty: Urology Why: at 12:45 for MD visit.   Contact information: [address] [place] [address] [phone]           I spent 40 minutes completing paperwork and coordinating discharge efforts.  Signed:  Birdie Hopes 10/31/2019, 12:39 PM

## 2019-10-31 NOTE — Progress Notes (Signed)
Reviewed discharge paperwork, follow up appointments, & medication regimen with patient. Provided patient with paper prescriptions. Patient discharged via wheelchair by NT.

## 2019-11-03 LAB — CULTURE, BLOOD (ROUTINE X 2)
Culture: NO GROWTH
Culture: NO GROWTH

## 2019-11-18 ENCOUNTER — Emergency Department (HOSPITAL_COMMUNITY): Payer: Medicare Other

## 2019-11-18 ENCOUNTER — Inpatient Hospital Stay (HOSPITAL_COMMUNITY)
Admission: EM | Admit: 2019-11-18 | Discharge: 2019-11-21 | DRG: 872 | Disposition: A | Discharge status: Home / Self Care | Payer: Medicare Other | Attending: Internal Medicine | Admitting: Internal Medicine

## 2019-11-18 ENCOUNTER — Encounter (HOSPITAL_COMMUNITY): Payer: Self-pay

## 2019-11-18 ENCOUNTER — Other Ambulatory Visit: Payer: Self-pay

## 2019-11-18 DIAGNOSIS — A4159 Other Gram-negative sepsis: Principal | ICD-10-CM | POA: Diagnosis present

## 2019-11-18 DIAGNOSIS — I10 Essential (primary) hypertension: Secondary | ICD-10-CM | POA: Diagnosis present

## 2019-11-18 DIAGNOSIS — A419 Sepsis, unspecified organism: Secondary | ICD-10-CM

## 2019-11-18 DIAGNOSIS — Z794 Long term (current) use of insulin: Secondary | ICD-10-CM | POA: Diagnosis not present

## 2019-11-18 DIAGNOSIS — Z87891 Personal history of nicotine dependence: Secondary | ICD-10-CM

## 2019-11-18 DIAGNOSIS — I252 Old myocardial infarction: Secondary | ICD-10-CM | POA: Diagnosis not present

## 2019-11-18 DIAGNOSIS — E860 Dehydration: Secondary | ICD-10-CM | POA: Diagnosis present

## 2019-11-18 DIAGNOSIS — K219 Gastro-esophageal reflux disease without esophagitis: Secondary | ICD-10-CM | POA: Diagnosis present

## 2019-11-18 DIAGNOSIS — Z936 Other artificial openings of urinary tract status: Secondary | ICD-10-CM

## 2019-11-18 DIAGNOSIS — Z9079 Acquired absence of other genital organ(s): Secondary | ICD-10-CM

## 2019-11-18 DIAGNOSIS — Z7982 Long term (current) use of aspirin: Secondary | ICD-10-CM | POA: Diagnosis not present

## 2019-11-18 DIAGNOSIS — E871 Hypo-osmolality and hyponatremia: Secondary | ICD-10-CM | POA: Diagnosis present

## 2019-11-18 DIAGNOSIS — I2510 Atherosclerotic heart disease of native coronary artery without angina pectoris: Secondary | ICD-10-CM | POA: Diagnosis present

## 2019-11-18 DIAGNOSIS — E785 Hyperlipidemia, unspecified: Secondary | ICD-10-CM | POA: Diagnosis present

## 2019-11-18 DIAGNOSIS — N390 Urinary tract infection, site not specified: Secondary | ICD-10-CM | POA: Diagnosis present

## 2019-11-18 DIAGNOSIS — Z20828 Contact with and (suspected) exposure to other viral communicable diseases: Secondary | ICD-10-CM | POA: Diagnosis present

## 2019-11-18 DIAGNOSIS — Z79899 Other long term (current) drug therapy: Secondary | ICD-10-CM | POA: Diagnosis not present

## 2019-11-18 DIAGNOSIS — E119 Type 2 diabetes mellitus without complications: Secondary | ICD-10-CM | POA: Diagnosis present

## 2019-11-18 DIAGNOSIS — R509 Fever, unspecified: Secondary | ICD-10-CM | POA: Diagnosis present

## 2019-11-18 DIAGNOSIS — Z955 Presence of coronary angioplasty implant and graft: Secondary | ICD-10-CM

## 2019-11-18 DIAGNOSIS — Z8551 Personal history of malignant neoplasm of bladder: Secondary | ICD-10-CM

## 2019-11-18 DIAGNOSIS — N179 Acute kidney failure, unspecified: Secondary | ICD-10-CM | POA: Diagnosis present

## 2019-11-18 DIAGNOSIS — C679 Malignant neoplasm of bladder, unspecified: Secondary | ICD-10-CM | POA: Diagnosis present

## 2019-11-18 DIAGNOSIS — N3000 Acute cystitis without hematuria: Secondary | ICD-10-CM

## 2019-11-18 LAB — CBC WITH DIFFERENTIAL/PLATELET
Abs Immature Granulocytes: 0.1 10*3/uL — ABNORMAL HIGH (ref 0.00–0.07)
Basophils Absolute: 0 10*3/uL (ref 0.0–0.1)
Basophils Relative: 0 %
Eosinophils Absolute: 0 10*3/uL (ref 0.0–0.5)
Eosinophils Relative: 0 %
HCT: 35.5 % — ABNORMAL LOW (ref 39.0–52.0)
Hemoglobin: 11.5 g/dL — ABNORMAL LOW (ref 13.0–17.0)
Immature Granulocytes: 1 %
Lymphocytes Relative: 6 %
Lymphs Abs: 0.7 10*3/uL (ref 0.7–4.0)
MCH: 30.4 pg (ref 26.0–34.0)
MCHC: 32.4 g/dL (ref 30.0–36.0)
MCV: 93.9 fL (ref 80.0–100.0)
Monocytes Absolute: 1.5 10*3/uL — ABNORMAL HIGH (ref 0.1–1.0)
Monocytes Relative: 14 %
Neutro Abs: 8.6 10*3/uL — ABNORMAL HIGH (ref 1.7–7.7)
Neutrophils Relative %: 79 %
Platelets: 186 10*3/uL (ref 150–400)
RBC: 3.78 MIL/uL — ABNORMAL LOW (ref 4.22–5.81)
RDW: 13.6 % (ref 11.5–15.5)
WBC: 10.9 10*3/uL — ABNORMAL HIGH (ref 4.0–10.5)
nRBC: 0 % (ref 0.0–0.2)

## 2019-11-18 LAB — URINALYSIS, ROUTINE W REFLEX MICROSCOPIC
Bacteria, UA: NONE SEEN
Bilirubin Urine: NEGATIVE
Glucose, UA: NEGATIVE mg/dL
Ketones, ur: NEGATIVE mg/dL
Nitrite: NEGATIVE
Protein, ur: 100 mg/dL — AB
Specific Gravity, Urine: 1.014 (ref 1.005–1.030)
pH: 6 (ref 5.0–8.0)

## 2019-11-18 LAB — COMPREHENSIVE METABOLIC PANEL
ALT: 15 U/L (ref 0–44)
AST: 16 U/L (ref 15–41)
Albumin: 3.6 g/dL (ref 3.5–5.0)
Alkaline Phosphatase: 43 U/L (ref 38–126)
Anion gap: 8 (ref 5–15)
BUN: 22 mg/dL (ref 8–23)
CO2: 23 mmol/L (ref 22–32)
Calcium: 9.2 mg/dL (ref 8.9–10.3)
Chloride: 99 mmol/L (ref 98–111)
Creatinine, Ser: 1.43 mg/dL — ABNORMAL HIGH (ref 0.61–1.24)
GFR calc Af Amer: 55 mL/min — ABNORMAL LOW (ref >60)
GFR calc non Af Amer: 48 mL/min — ABNORMAL LOW (ref >60)
Glucose, Bld: 113 mg/dL — ABNORMAL HIGH (ref 70–99)
Potassium: 3.8 mmol/L (ref 3.5–5.1)
Sodium: 130 mmol/L — ABNORMAL LOW (ref 135–145)
Total Bilirubin: 1.3 mg/dL — ABNORMAL HIGH (ref 0.3–1.2)
Total Protein: 7.2 g/dL (ref 6.5–8.1)

## 2019-11-18 LAB — GLUCOSE, CAPILLARY: Glucose-Capillary: 194 mg/dL — ABNORMAL HIGH (ref 70–99)

## 2019-11-18 LAB — POC SARS CORONAVIRUS 2 AG -  ED: SARS Coronavirus 2 Ag: NEGATIVE

## 2019-11-18 LAB — LACTIC ACID, PLASMA
Lactic Acid, Venous: 1.3 mmol/L (ref 0.5–1.9)
Lactic Acid, Venous: 0.9 mmol/L (ref 0.5–1.9)

## 2019-11-18 LAB — PROTIME-INR
INR: 1.1 (ref 0.8–1.2)
Prothrombin Time: 14.6 seconds (ref 11.4–15.2)

## 2019-11-18 MED ORDER — INSULIN GLARGINE 100 UNIT/ML ~~LOC~~ SOLN
20.0000 [IU] | Freq: Every day | SUBCUTANEOUS | Status: DC
  Administered 2019-11-19 – 2019-11-21 (×3): 20 [IU] via SUBCUTANEOUS
  Filled 2019-11-18 (×3): qty 0.2

## 2019-11-18 MED ORDER — CARVEDILOL 3.125 MG PO TABS
3.1250 mg | ORAL_TABLET | Freq: Every day | ORAL | Status: DC
  Administered 2019-11-19 – 2019-11-21 (×3): 3.125 mg via ORAL
  Filled 2019-11-18 (×3): qty 1

## 2019-11-18 MED ORDER — ACETAMINOPHEN 500 MG PO TABS
500.0000 mg | ORAL_TABLET | Freq: Four times a day (QID) | ORAL | Status: DC | PRN
  Administered 2019-11-20: 500 mg via ORAL
  Filled 2019-11-18 (×2): qty 1

## 2019-11-18 MED ORDER — ONDANSETRON HCL 4 MG PO TABS: 8.0000 mg | ORAL_TABLET | Freq: Two times a day (BID) | ORAL | Status: DC | PRN

## 2019-11-18 MED ORDER — ONDANSETRON HCL 4 MG/2ML IJ SOLN
4.0000 mg | Freq: Four times a day (QID) | INTRAMUSCULAR | Status: DC | PRN
  Administered 2019-11-18 – 2019-11-19 (×4): 4 mg via INTRAVENOUS
  Filled 2019-11-18 (×4): qty 2

## 2019-11-18 MED ORDER — SODIUM CHLORIDE 0.9 % IV BOLUS
1000.0000 mL | Freq: Once | INTRAVENOUS | Status: AC
  Administered 2019-11-18: 19:00:00 1000 mL via INTRAVENOUS

## 2019-11-18 MED ORDER — SODIUM CHLORIDE 0.9 % IV BOLUS
1000.0000 mL | Freq: Once | INTRAVENOUS | Status: AC
  Administered 2019-11-18: 1000 mL via INTRAVENOUS

## 2019-11-18 MED ORDER — HEPARIN SODIUM (PORCINE) 5000 UNIT/ML IJ SOLN
5000.0000 [IU] | Freq: Three times a day (TID) | INTRAMUSCULAR | Status: DC
  Administered 2019-11-19 – 2019-11-21 (×7): 5000 [IU] via SUBCUTANEOUS
  Filled 2019-11-18 (×7): qty 1

## 2019-11-18 MED ORDER — INSULIN ASPART 100 UNIT/ML ~~LOC~~ SOLN
0.0000 [IU] | Freq: Three times a day (TID) | SUBCUTANEOUS | Status: DC
  Administered 2019-11-19: 17:00:00 4 [IU] via SUBCUTANEOUS
  Administered 2019-11-19: 7 [IU] via SUBCUTANEOUS
  Administered 2019-11-19: 12:00:00 11 [IU] via SUBCUTANEOUS
  Administered 2019-11-20 (×3): 4 [IU] via SUBCUTANEOUS
  Administered 2019-11-21: 12:00:00 7 [IU] via SUBCUTANEOUS
  Administered 2019-11-21: 4 [IU] via SUBCUTANEOUS

## 2019-11-18 MED ORDER — ASPIRIN 81 MG PO CHEW
81.0000 mg | CHEWABLE_TABLET | Freq: Every day | ORAL | Status: DC
  Administered 2019-11-19 – 2019-11-21 (×3): 81 mg via ORAL
  Filled 2019-11-18 (×3): qty 1

## 2019-11-18 MED ORDER — ACETAMINOPHEN 650 MG RE SUPP
650.0000 mg | Freq: Once | RECTAL | Status: AC
  Administered 2019-11-18: 21:00:00 650 mg via RECTAL
  Filled 2019-11-18: qty 1

## 2019-11-18 MED ORDER — SODIUM CHLORIDE 0.9 % IV SOLN
INTRAVENOUS | Status: DC
  Administered 2019-11-19 – 2019-11-21 (×6): via INTRAVENOUS

## 2019-11-18 MED ORDER — FAMOTIDINE 20 MG PO TABS
10.0000 mg | ORAL_TABLET | Freq: Two times a day (BID) | ORAL | Status: DC | PRN
  Administered 2019-11-19 – 2019-11-20 (×3): 10 mg via ORAL
  Filled 2019-11-18 (×3): qty 1

## 2019-11-18 MED ORDER — ACETAMINOPHEN 500 MG PO TABS
1000.0000 mg | ORAL_TABLET | Freq: Once | ORAL | Status: DC
  Filled 2019-11-18: qty 2

## 2019-11-18 MED ORDER — SIMVASTATIN 20 MG PO TABS
20.0000 mg | ORAL_TABLET | Freq: Every day | ORAL | Status: DC
  Administered 2019-11-19 – 2019-11-20 (×2): 20 mg via ORAL
  Filled 2019-11-18 (×2): qty 1

## 2019-11-18 MED ORDER — LISINOPRIL 5 MG PO TABS
5.0000 mg | ORAL_TABLET | Freq: Every day | ORAL | Status: DC
  Administered 2019-11-19 – 2019-11-21 (×3): 5 mg via ORAL
  Filled 2019-11-18 (×3): qty 1

## 2019-11-18 MED ORDER — ONDANSETRON HCL 4 MG PO TABS: 4.0000 mg | ORAL_TABLET | Freq: Four times a day (QID) | ORAL | Status: DC | PRN

## 2019-11-18 MED ORDER — CALCIUM CARBONATE ANTACID 500 MG PO CHEW
1.0000 | CHEWABLE_TABLET | Freq: Once | ORAL | Status: AC
  Administered 2019-11-19: 200 mg via ORAL
  Filled 2019-11-18: qty 1

## 2019-11-18 MED ORDER — SODIUM CHLORIDE 0.9 % IV SOLN
1.0000 g | Freq: Once | INTRAVENOUS | Status: AC
  Administered 2019-11-18: 1 g via INTRAVENOUS
  Filled 2019-11-18: qty 10

## 2019-11-18 MED ORDER — INSULIN ASPART 100 UNIT/ML ~~LOC~~ SOLN: 0.0000 [IU] | Freq: Every day | SUBCUTANEOUS | Status: DC
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
## 2019-11-18 NOTE — ED Triage Notes (Signed)
Pt arrives with wife. Since last night at 2000, pt has had a fever, going as high as 103. Pt has also had emesis, as well as fatigue. Pt is concerned that he has become septic, and was sent over by urology. Pt is a cancer pt.
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
## 2019-11-19 DIAGNOSIS — N390 Urinary tract infection, site not specified: Secondary | ICD-10-CM

## 2019-11-19 DIAGNOSIS — A419 Sepsis, unspecified organism: Secondary | ICD-10-CM

## 2019-11-19 LAB — GLUCOSE, CAPILLARY
Glucose-Capillary: 214 mg/dL — ABNORMAL HIGH (ref 70–99)
Glucose-Capillary: 295 mg/dL — ABNORMAL HIGH (ref 70–99)
Glucose-Capillary: 186 mg/dL — ABNORMAL HIGH (ref 70–99)
Glucose-Capillary: 194 mg/dL — ABNORMAL HIGH (ref 70–99)

## 2019-11-19 LAB — BLOOD CULTURE ID PANEL (REFLEXED)
Acinetobacter baumannii: NOT DETECTED
Candida albicans: NOT DETECTED
Candida glabrata: NOT DETECTED
Candida krusei: NOT DETECTED
Candida parapsilosis: NOT DETECTED
Candida tropicalis: NOT DETECTED
Carbapenem resistance: NOT DETECTED
Enterobacter cloacae complex: NOT DETECTED
Enterobacteriaceae species: DETECTED — AB
Enterococcus species: NOT DETECTED
Escherichia coli: NOT DETECTED
Haemophilus influenzae: NOT DETECTED
Klebsiella oxytoca: DETECTED — AB
Klebsiella pneumoniae: NOT DETECTED
Listeria monocytogenes: NOT DETECTED
Neisseria meningitidis: NOT DETECTED
Proteus species: NOT DETECTED
Pseudomonas aeruginosa: NOT DETECTED
Serratia marcescens: NOT DETECTED
Staphylococcus aureus (BCID): NOT DETECTED
Staphylococcus species: NOT DETECTED
Streptococcus agalactiae: NOT DETECTED
Streptococcus pneumoniae: NOT DETECTED
Streptococcus pyogenes: NOT DETECTED
Streptococcus species: NOT DETECTED

## 2019-11-19 LAB — CBC
HCT: 33.1 % — ABNORMAL LOW (ref 39.0–52.0)
Hemoglobin: 10.8 g/dL — ABNORMAL LOW (ref 13.0–17.0)
MCH: 31.2 pg (ref 26.0–34.0)
MCHC: 32.6 g/dL (ref 30.0–36.0)
MCV: 95.7 fL (ref 80.0–100.0)
Platelets: 147 10*3/uL — ABNORMAL LOW (ref 150–400)
RBC: 3.46 MIL/uL — ABNORMAL LOW (ref 4.22–5.81)
RDW: 13.7 % (ref 11.5–15.5)
WBC: 9.5 10*3/uL (ref 4.0–10.5)
nRBC: 0 % (ref 0.0–0.2)

## 2019-11-19 LAB — COMPREHENSIVE METABOLIC PANEL
ALT: 14 U/L (ref 0–44)
AST: 14 U/L — ABNORMAL LOW (ref 15–41)
Albumin: 3 g/dL — ABNORMAL LOW (ref 3.5–5.0)
Alkaline Phosphatase: 44 U/L (ref 38–126)
Anion gap: 12 (ref 5–15)
BUN: 25 mg/dL — ABNORMAL HIGH (ref 8–23)
CO2: 21 mmol/L — ABNORMAL LOW (ref 22–32)
Calcium: 8.5 mg/dL — ABNORMAL LOW (ref 8.9–10.3)
Chloride: 100 mmol/L (ref 98–111)
Creatinine, Ser: 1.08 mg/dL (ref 0.61–1.24)
GFR calc Af Amer: >60 mL/min (ref >60)
GFR calc non Af Amer: >60 mL/min (ref >60)
Glucose, Bld: 245 mg/dL — ABNORMAL HIGH (ref 70–99)
Potassium: 4.1 mmol/L (ref 3.5–5.1)
Sodium: 133 mmol/L — ABNORMAL LOW (ref 135–145)
Total Bilirubin: 1.1 mg/dL (ref 0.3–1.2)
Total Protein: 6.4 g/dL — ABNORMAL LOW (ref 6.5–8.1)

## 2019-11-19 LAB — URINE CULTURE

## 2019-11-19 MED ORDER — SODIUM CHLORIDE 0.9 % IV SOLN
2.0000 g | INTRAVENOUS | Status: DC
  Administered 2019-11-19 – 2019-11-20 (×2): 2 g via INTRAVENOUS
  Filled 2019-11-19: qty 20
  Filled 2019-11-19 (×2): qty 2

## 2019-11-19 MED ORDER — PROMETHAZINE HCL 25 MG/ML IJ SOLN
12.5000 mg | Freq: Four times a day (QID) | INTRAMUSCULAR | Status: DC | PRN
  Administered 2019-11-19: 16:00:00 12.5 mg via INTRAVENOUS
  Filled 2019-11-19: qty 1

## 2019-11-19 MED ORDER — SODIUM CHLORIDE 0.9% FLUSH
10.0000 mL | Freq: Two times a day (BID) | INTRAVENOUS | Status: DC
  Administered 2019-11-19 – 2019-11-21 (×2): 10 mL

## 2019-11-19 MED ORDER — GLUCERNA SHAKE PO LIQD
237.0000 mL | Freq: Three times a day (TID) | ORAL | Status: DC
  Filled 2019-11-19 (×7): qty 237

## 2019-11-19 MED ORDER — ADULT MULTIVITAMIN W/MINERALS CH
1.0000 | ORAL_TABLET | Freq: Every day | ORAL | Status: DC
  Administered 2019-11-20 – 2019-11-21 (×2): 1 via ORAL
  Filled 2019-11-19 (×2): qty 1

## 2019-11-19 MED ORDER — ACETAMINOPHEN 650 MG RE SUPP
650.0000 mg | Freq: Four times a day (QID) | RECTAL | Status: DC | PRN
  Administered 2019-11-19 (×2): 650 mg via RECTAL
  Filled 2019-11-19 (×2): qty 1

## 2019-11-19 MED ORDER — SODIUM CHLORIDE 0.9 % IV SOLN
1.0000 g | INTRAVENOUS | Status: DC
  Filled 2019-11-19: qty 10

## 2019-11-19 MED ORDER — CHLORHEXIDINE GLUCONATE CLOTH 2 % EX PADS
6.0000 | MEDICATED_PAD | Freq: Every day | CUTANEOUS | Status: DC
  Administered 2019-11-19 – 2019-11-21 (×3): 6 via TOPICAL

## 2019-11-19 NOTE — Progress Notes (Signed)
Inpatient Diabetes Program Recommendations  AACE/ADA: New Consensus Statement on Inpatient Glycemic Control (2015)  Target Ranges:  Prepandial:   less than 140 mg/dL      Peak postprandial:   less than 180 mg/dL (1-2 hours)      Critically ill patients:  140 - 180 mg/dL   Lab Results  Component Value Date   GLUCAP 295 (H) 11/19/2019   HGBA1C 6.7 (H) 10/29/2019    Review of Glycemic Control Results for Gregory Cummings, Gregory "BEN" (MRN [id number]) as of 11/19/2019 13:19  Ref. Range 11/19/2019 07:37 11/19/2019 11:48  Glucose-Capillary Latest Ref Range: 70 - 99 mg/dL 214 (H)  Novolog 7 units  Lantus 20 units 295 (H)  Novolog 11 units   Diabetes history: DM 2 Outpatient Diabetes medications: Lantus 20 units Daily, Regular insulin 30-100 units tid with meals Current orders for Inpatient glycemic control:  Lantus 20 units Daily Novolog 0-20 units tid + hs  A1c 6.7% on 11/02  Inpatient Diabetes Program Recommendations:    Consider adding Novolog 10 units tid meal coverage if pt eating >50% of meals.  Thanks,  Tama Headings RN, MSN, BC-ADM Inpatient Diabetes Coordinator Team Pager [phone] (8a-5p)

## 2019-11-19 NOTE — Progress Notes (Signed)
PROGRESS NOTE    Gregory Cummings  [id number] DOB: December 24, 1946 DOA: 11/18/2019 PCP: [name], MD   Brief Narrative:  Patient is a 73 year old male with history of bladder cancer status post robotic cystectomy in October this year, status post urostomy, coronary disease, diabetes, hypertension, GERD, hyperlipidemia who presents to the emergency department with complaints of fever and weakness at home.  Complained of decreased appetite.  He reported cloudy urine in the urostomy bag.  Patient was admitted for with suspicion for sepsis.  Started on broad-spectrum antibiotics.  Blood culture now showing Klebsiella.  Assessment & Plan:   Principal Problem:   Sepsis secondary to UTI [facility]) Active Problems:   Malignant neoplasm of urinary bladder (HCC)   Hyponatremia   CAD (coronary artery disease)   DM2 (diabetes mellitus, type 2) (HCC)   HTN (hypertension)   Gram-negative bacteremia: Most likely secondary to urinary tract infection.  He is status post robotic cystectomy and is urostomy.  Presented with generalized weakness, fever.  Currently hemodynamically stable.  Preliminary blood cultures showing Klebsiella.  Will follow final culture report.  Continue ceftriaxone 2 g daily. Continue gentle IV fluids  History of bladder cancer: Has urostomy.  Status post robotic cystectomy.  Follows with urology.  History of coronary artery disease: Currently stable.  Continue home meds  Diabetes type 2: Continue sliding scale insulin with long-acting insulin.  Continue to monitor CBGs  Hypertension: Continue current medicines.  Currently blood pressure stable  Hyponatremia: Stable now.         DVT prophylaxis: Heparin [place] Code Status: Full Family Communication: None present at the bedside Disposition Plan: Home after final blood culture report   Consultants: None  Procedures: None  Antimicrobials:  Anti-infectives (From admission, onward)   Start     Dose/Rate Route Frequency  Ordered Stop   11/19/19 1600  cefTRIAXone (ROCEPHIN) 1 g in sodium chloride 0.9 % 100 mL IVPB  Status:  Discontinued     1 g 200 mL/hr over 30 Minutes Intravenous Every 24 hours 11/19/19 0230 11/19/19 1241   11/19/19 1600  cefTRIAXone (ROCEPHIN) 2 g in sodium chloride 0.9 % 100 mL IVPB     2 g 200 mL/hr over 30 Minutes Intravenous Every 24 hours 11/19/19 1241     11/18/19 1545  cefTRIAXone (ROCEPHIN) 1 g in sodium chloride 0.9 % 100 mL IVPB     1 g 200 mL/hr over 30 Minutes Intravenous  Once 11/18/19 1533 11/18/19 1636      Subjective: Patient seen and examined the bedside this morning.  Hemodynamically stable.  Looks comfortable during my evaluation.  Afebrile today but had fever earlier this morning.  Denies any abdominal pain, nausea or vomiting.  Objective: Vitals:   11/18/19 2324 11/19/19 0427 11/19/19 0716 11/19/19 0828  BP: (!) 141/80 (!) 148/79  134/84  Pulse: 88 98  87  Resp: 18 18  18   Temp: 99.5 F (37.5 C) (!) 100.6 F (38.1 C) 99 F (37.2 C) 98.5 F (36.9 C)  TempSrc: Oral Oral Oral Oral  SpO2: 98% 97%  96%  Weight:      Height:        Intake/Output Summary (Last 24 hours) at 11/19/2019 1242 Last data filed at 11/19/2019 0718 Gross per 24 hour  Intake 1363.38 ml  Output 850 ml  Net 513.38 ml   Filed Weights   11/18/19 1407 11/18/19 2322  Weight: 96 kg 92.7 kg    Examination:  General exam: Appears calm and comfortable ,Not in  distress HEENT:PERRL,Oral mucosa moist, Ear/Nose normal on gross exam Respiratory system: Bilateral equal air entry, normal vesicular breath sounds, no wheezes or crackles  Cardiovascular system: S1 & S2 heard, RRR. No JVD, murmurs, rubs, gallops or clicks. No pedal edema. Gastrointestinal system: Abdomen is nondistended, soft and nontender. No organomegaly or masses felt. Normal bowel sounds heard.Urostomy with clear urine Central nervous system: Alert and oriented. No focal neurological deficits. Extremities: No edema, no  clubbing ,no cyanosis, distal peripheral pulses palpable. Skin: No rashes, lesions or ulcers,no icterus ,no pallor  Data Reviewed: I have personally reviewed following labs and imaging studies  CBC: Recent Labs  Lab 11/18/19 1510 11/19/19 0659  WBC 10.9* 9.5  NEUTROABS 8.6*  --   HGB 11.5* 10.8*  HCT 35.5* 33.1*  MCV 93.9 95.7  PLT 186 [id number]*   Basic Metabolic Panel: Recent Labs  Lab 11/18/19 1510 11/19/19 0659  NA 130* 133*  K 3.8 4.1  CL 99 100  CO2 23 21*  GLUCOSE 113* 245*  BUN 22 25*  CREATININE 1.43* 1.08  CALCIUM 9.2 8.5*   GFR: Estimated Creatinine Clearance: 66.5 mL/min (by C-G formula based on SCr of 1.08 mg/dL). Liver Function Tests: Recent Labs  Lab 11/18/19 1510 11/19/19 0659  AST 16 14*  ALT 15 14  ALKPHOS 43 44  BILITOT 1.3* 1.1  PROT 7.2 6.4*  ALBUMIN 3.6 3.0*   No results for input(s): LIPASE, AMYLASE in the last 168 hours. No results for input(s): AMMONIA in the last 168 hours. Coagulation Profile: Recent Labs  Lab 11/18/19 1510  INR 1.1   Cardiac Enzymes: No results for input(s): CKTOTAL, CKMB, CKMBINDEX, TROPONINI in the last 168 hours. BNP (last 3 results) No results for input(s): PROBNP in the last 8760 hours. HbA1C: No results for input(s): HGBA1C in the last 72 hours. CBG: Recent Labs  Lab 11/18/19 2346 11/19/19 0737 11/19/19 1148  GLUCAP 194* 214* 295*   Lipid Profile: No results for input(s): CHOL, HDL, LDLCALC, TRIG, CHOLHDL, LDLDIRECT in the last 72 hours. Thyroid Function Tests: No results for input(s): TSH, T4TOTAL, FREET4, T3FREE, THYROIDAB in the last 72 hours. Anemia Panel: No results for input(s): VITAMINB12, FOLATE, FERRITIN, TIBC, IRON, RETICCTPCT in the last 72 hours. Sepsis Labs: Recent Labs  Lab 11/18/19 1526 11/18/19 1930  LATICACIDVEN 1.3 0.9    Recent Results (from the past 240 hour(s))  Culture, blood (Routine x 2)     Status: None (Preliminary result)   Collection Time: 11/18/19  3:10 PM    Specimen: BLOOD  Result Value Ref Range Status   Specimen Description   Final    BLOOD LEFT ANTECUBITAL Performed at [facility] [address] [place], [place] [address]    Special Requests   Final    BOTTLES DRAWN AEROBIC AND ANAEROBIC Blood Culture results may not be optimal due to an excessive volume of blood received in culture bottles Performed at [facility] [address] [place], [place] [address]    Culture  Setup Time   Final    GRAM NEGATIVE RODS IN BOTH AEROBIC AND ANAEROBIC BOTTLES Organism ID to follow CRITICAL RESULT CALLED TO, READ BACK BY AND VERIFIED WITH: Elenore Paddy PharmD 12:25 11/19/19 ([email or web address])    Culture   Final    CULTURE REINCUBATED FOR BETTER GROWTH Performed at [facility], [place] [address] [place], [place] [address]    Report Status PENDING  Incomplete  Blood Culture ID Panel (Reflexed)  Status: Abnormal   Collection Time: 11/18/19  3:10 PM  Result Value Ref Range Status   Enterococcus species NOT DETECTED NOT DETECTED Final   Listeria monocytogenes NOT DETECTED NOT DETECTED Final   Staphylococcus species NOT DETECTED NOT DETECTED Final   Staphylococcus aureus (BCID) NOT DETECTED NOT DETECTED Final   Streptococcus species NOT DETECTED NOT DETECTED Final   Streptococcus agalactiae NOT DETECTED NOT DETECTED Final   Streptococcus pneumoniae NOT DETECTED NOT DETECTED Final   Streptococcus pyogenes NOT DETECTED NOT DETECTED Final   Acinetobacter baumannii NOT DETECTED NOT DETECTED Final   Enterobacteriaceae species DETECTED (A) NOT DETECTED Final    Comment: Enterobacteriaceae represent a large family of gram-negative bacteria, not a single organism. CRITICAL RESULT CALLED TO, READ BACK BY AND VERIFIED WITH: Elenore Paddy PharmD 12:25 11/19/19 ([email or web address])    Enterobacter cloacae complex NOT DETECTED NOT DETECTED Final   Escherichia coli NOT DETECTED NOT DETECTED Final   Klebsiella oxytoca DETECTED  (A) NOT DETECTED Final    Comment: CRITICAL RESULT CALLED TO, READ BACK BY AND VERIFIED WITH: Elenore Paddy PharmD 12:25 11/19/19 ([email or web address])    Klebsiella pneumoniae NOT DETECTED NOT DETECTED Final   Proteus species NOT DETECTED NOT DETECTED Final   Serratia marcescens NOT DETECTED NOT DETECTED Final   Carbapenem resistance NOT DETECTED NOT DETECTED Final   Haemophilus influenzae NOT DETECTED NOT DETECTED Final   Neisseria meningitidis NOT DETECTED NOT DETECTED Final   Pseudomonas aeruginosa NOT DETECTED NOT DETECTED Final   Candida albicans NOT DETECTED NOT DETECTED Final   Candida glabrata NOT DETECTED NOT DETECTED Final   Candida krusei NOT DETECTED NOT DETECTED Final   Candida parapsilosis NOT DETECTED NOT DETECTED Final   Candida tropicalis NOT DETECTED NOT DETECTED Final    Comment: Performed at [facility], [place] [address] [place], [place] [address]  Culture, blood (Routine x 2)     Status: None (Preliminary result)   Collection Time: 11/18/19  3:30 PM   Specimen: BLOOD  Result Value Ref Range Status   Specimen Description   Final    BLOOD PORTA CATH Performed at [facility] [address], [place], [place] [address]    Special Requests   Final    BOTTLES DRAWN AEROBIC AND ANAEROBIC Blood Culture adequate volume Performed at [facility] [address], [place], [place] [address]    Culture   Final    NO GROWTH < 12 HOURS Performed at [facility] [address] [place], [place] [address]    Report Status PENDING  Incomplete  Urine culture     Status: None (Preliminary result)   Collection Time: 11/18/19  8:13 PM   Specimen: Urine, Random  Result Value Ref Range Status   Specimen Description URINE, RANDOM  Final   Special Requests   Final    BAG PEDI Performed at [facility], [place] [address] [place], [place] [address]    Culture PENDING  Incomplete   Report Status PENDING  Incomplete          Radiology Studies: Dg Chest 2 View  Result Date: 11/18/2019 CLINICAL DATA:  Sepsis. EXAM: CHEST - 2 VIEW COMPARISON:  October 28, 2019 FINDINGS: There is a well-positioned right-sided Port-A-Cath. The heart size is unchanged from prior study. There is atelectasis at the lung bases. There is no pneumothorax. No large pleural effusion. There is no acute osseous abnormality. A coronary stent is noted. IMPRESSION: No active cardiopulmonary disease. Electronically Signed  By: Constance Holster M.D.   On: 11/18/2019 16:28        Scheduled Meds: . aspirin  81 mg Oral Daily  . carvedilol  3.125 mg Oral Daily  . Chlorhexidine Gluconate Cloth  6 each Topical Daily  . heparin  5,000 Units Subcutaneous Q8H  . insulin aspart  0-20 Units Subcutaneous TID WC  . insulin aspart  0-5 Units Subcutaneous QHS  . insulin glargine  20 Units Subcutaneous Daily  . lisinopril  5 mg Oral Daily  . simvastatin  20 mg Oral QHS  . sodium chloride flush  10-40 mL Intracatheter Q12H   Continuous Infusions: . sodium chloride 100 mL/hr at 11/19/19 0539  . cefTRIAXone (ROCEPHIN)  IV       LOS: 1 day    Time spent: 25 mins.More than 50% of that time was spent in counseling and/or coordination of care.      Shelly Coss, MD [facility] Pager [phone]  If 7PM-7AM, please contact night-coverage [email or web address] Password [facility] 11/19/2019, 12:42 PM

## 2019-11-19 NOTE — TOC Initial Note (Signed)
Transition of Care [facility]) - Initial/Assessment Note    Patient Details  Name: Gregory Cummings MRN: [id number] Date of Birth: Oct 19, 1946  Transition of Care [facility]) CM/SW Contact:    Gregory Mage, LCSW Phone Number: 11/19/2019, 5:01 PM  Clinical Narrative:  Met with patient who is high risk for readmission, wife was at bedside and he gave me permission to speak with her as well.  He identified no needs, and wife agreed with this.  They have a daughter who is a Marine scientist in [place] and they count on her for medical advice.  She was here recently for a week to help out, for which both patient and wife are very grateful.  TOC will continue to follow during the course of hospitalization.                  Expected Discharge Plan: Home/Self Care Barriers to Discharge: No Barriers Identified   Patient Goals and CMS Choice Patient states their goals for this hospitalization and ongoing recovery are:: "I am just gonna keep doing what the Dr tells me to do."      Expected Discharge Plan and Services Expected Discharge Plan: Home/Self Care   Discharge Planning Services: CM Consult   Living arrangements for the past 2 months: Single Family Home                                      Prior Living Arrangements/Services Living arrangements for the past 2 months: Single Family Home Lives with:: Spouse Patient language and need for interpreter reviewed:: Yes Do you feel safe going back to the place where you live?: Yes      Need for Family Participation in Patient Care: Yes (Comment) Care giver support system in place?: Yes (comment)   Criminal Activity/Legal Involvement Pertinent to Current Situation/Hospitalization: No - Comment as needed  Activities of Daily Living Home Assistive Devices/Equipment: CBG Meter, Ostomy supplies, Cane (specify quad or straight) ADL Screening (condition at time of admission) Patient's cognitive ability adequate to safely complete daily activities?: Yes Is  the patient deaf or have difficulty hearing?: No Does the patient have difficulty seeing, even when wearing glasses/contacts?: No Does the patient have difficulty concentrating, remembering, or making decisions?: No Patient able to express need for assistance with ADLs?: Yes Does the patient have difficulty dressing or bathing?: No Independently performs ADLs?: Yes (appropriate for developmental age) Does the patient have difficulty walking or climbing stairs?: No Weakness of Legs: None Weakness of Arms/Hands: None  Permission Sought/Granted Permission sought to share information with : Family Supports Permission granted to share information with : Yes, Verbal Permission Granted  Share Information with NAME: Gregory Cummings     Permission granted to share info w Relationship: wife     Emotional Assessment Appearance:: Appears stated age Attitude/Demeanor/Rapport: Engaged Affect (typically observed): Appropriate Orientation: : Oriented to Self, Oriented to Place, Oriented to  Time, Oriented to Situation Alcohol / Substance Use: Not Applicable Psych Involvement: No (comment)  Admission diagnosis:  Acute cystitis without hematuria [N30.00] Sepsis without acute organ dysfunction, due to unspecified organism [facility]) [A41.9] Patient Active Problem List   Diagnosis Date Noted  . Sepsis secondary to UTI ([place]) 10/28/2019  . Suspected COVID-19 virus infection 10/28/2019  . HTN (hypertension) 10/28/2019  . Hyponatremia 10/13/2019  . CAD (coronary artery disease) 10/13/2019  . DM2 (diabetes mellitus, type 2) ([place]) 10/13/2019  . Bladder cancer ([place]) 10/10/2019  .  Port-A-Cath in place 05/29/2019  . Malignant neoplasm of urinary bladder ([place]) 05/17/2019  . Goals of care, counseling/discussion 05/17/2019  . Rhinitis/post nasal drainage 06/14/2018  . Cough, persistent 08/28/2015   PCP:  Gregory Manes, MD Pharmacy:   Gregory Cummings, Gregory Cummings Phone: [phone] Fax: [phone]     Social Determinants of Health (SDOH) Interventions    Readmission Risk Interventions No flowsheet data found.

## 2019-11-19 NOTE — Progress Notes (Signed)
Initial Nutrition Assessment  DOCUMENTATION CODES:   Obesity unspecified  INTERVENTION:  -Glucerna Shake po TID, each supplement provides 220 kcal and 10 grams of protein  -MVI with minerals daily   NUTRITION DIAGNOSIS:   Increased nutrient needs related to acute illness(sepsis secondary to UTI) as evidenced by estimated needs, percent weight loss(8.8 lbs x 3 weeks).    GOAL:        MONITOR:   PO intake, Labs, I & O's, Supplement acceptance, Weight trends  REASON FOR ASSESSMENT:   Malnutrition Screening Tool    ASSESSMENT:  RD working remotely.  73 year old male with medical history significant of bladder cancer with urostomy bag following robotic cystectomy in October, CAD, T2DM, HTN, GERD, HLD, pyelonephritis, recently treated for Klebsiella UTI on Cefdinir who presented to ED with complaints of decreased appetite, fever, and weakness.  Patient admitted with sepsis secondary to UTI  Blood cultures results positive for Klebsiella and  Gram-negative bacteria  Unable to reach patient via phone this afternoon to obtain nutrition history. No meals have been recorded this admission. Will continue to monitor for po intake and provide Glucerna to aid with calorie and protein needs.   I/Os: +1013 ml since admit    UOP: 850 ml x 24 hrs Weight history reviewed; noted 8.8 lb (4.1%)  wt loss in the past 3 weeks which is significant for time frame. Weights have remained stable 99.3 - 98.4 kg over the past 6 months.   Medications reviewed and include: SSI, Lantus 20 units daily NaCl Rocephin Labs: CBGs 194-295, Na 133 (L), Hgb 10.8 (L)  NUTRITION - FOCUSED PHYSICAL EXAM: Unable to complete at this time, RD working remotely.  Diet Order:   Diet Order            Diet heart healthy/carb modified Room service appropriate? Yes; Fluid consistency: Thin  Diet effective now              EDUCATION NEEDS:   No education needs have been identified at this time  Skin:   Skin Assessment: Reviewed RN Assessment(urostomy; right; abdomen)  Last BM:  PTA  Height:   Ht Readings from Last 1 Encounters:  11/18/19 5\' 9"  (1.753 m)    Weight:   Wt Readings from Last 1 Encounters:  11/18/19 92.7 kg    Ideal Body Weight:  72.7 kg  BMI:  Body mass index is 30.18 kg/m.  Estimated Nutritional Needs:   Kcal:  [id number]  Protein:  100-108  Fluid:  2 L/day   Lajuan Lines, RD, LDN Clinical Nutrition [facility] Telephone [phone] After Hours/Weekend Pager: [phone]

## 2019-11-19 NOTE — Progress Notes (Signed)
PHARMACY - PHYSICIAN COMMUNICATION CRITICAL VALUE ALERT - BLOOD CULTURE IDENTIFICATION (BCID)  Gregory Cummings is an 73 y.o. male who presented to [facility] on 11/18/2019 with a chief complaint of fever  Assessment:  Gram negative bactermia  From UTI source  Name of physician (or Provider) Contacted: Dr. Tawanna Solo  Current antibiotics: Ceftriaxone 2 gr IV q24h  Changes to prescribed antibiotics recommended:  Patient is on recommended antibiotics - No changes needed  Results for orders placed or performed during the hospital encounter of 11/18/19  Blood Culture ID Panel (Reflexed) (Collected: 11/18/2019  3:10 PM)  Result Value Ref Range   Enterococcus species NOT DETECTED NOT DETECTED   Listeria monocytogenes NOT DETECTED NOT DETECTED   Staphylococcus species NOT DETECTED NOT DETECTED   Staphylococcus aureus (BCID) NOT DETECTED NOT DETECTED   Streptococcus species NOT DETECTED NOT DETECTED   Streptococcus agalactiae NOT DETECTED NOT DETECTED   Streptococcus pneumoniae NOT DETECTED NOT DETECTED   Streptococcus pyogenes NOT DETECTED NOT DETECTED   Acinetobacter baumannii NOT DETECTED NOT DETECTED   Enterobacteriaceae species DETECTED (A) NOT DETECTED   Enterobacter cloacae complex NOT DETECTED NOT DETECTED   Escherichia coli NOT DETECTED NOT DETECTED   Klebsiella oxytoca DETECTED (A) NOT DETECTED   Klebsiella pneumoniae NOT DETECTED NOT DETECTED   Proteus species NOT DETECTED NOT DETECTED   Serratia marcescens NOT DETECTED NOT DETECTED   Carbapenem resistance NOT DETECTED NOT DETECTED   Haemophilus influenzae NOT DETECTED NOT DETECTED   Neisseria meningitidis NOT DETECTED NOT DETECTED   Pseudomonas aeruginosa NOT DETECTED NOT DETECTED   Candida albicans NOT DETECTED NOT DETECTED   Candida glabrata NOT DETECTED NOT DETECTED   Candida krusei NOT DETECTED NOT DETECTED   Candida parapsilosis NOT DETECTED NOT DETECTED   Candida tropicalis NOT DETECTED NOT DETECTED     Royetta Asal, PharmD, BCPS 11/19/2019 1:42 PM

## 2019-11-20 LAB — CBC WITH DIFFERENTIAL/PLATELET
Abs Immature Granulocytes: 0.02 10*3/uL (ref 0.00–0.07)
Basophils Absolute: 0 10*3/uL (ref 0.0–0.1)
Basophils Relative: 0 %
Eosinophils Absolute: 0.1 10*3/uL (ref 0.0–0.5)
Eosinophils Relative: 1 %
HCT: 30.8 % — ABNORMAL LOW (ref 39.0–52.0)
Hemoglobin: 10 g/dL — ABNORMAL LOW (ref 13.0–17.0)
Immature Granulocytes: 0 %
Lymphocytes Relative: 20 %
Lymphs Abs: 1.3 10*3/uL (ref 0.7–4.0)
MCH: 31.1 pg (ref 26.0–34.0)
MCHC: 32.5 g/dL (ref 30.0–36.0)
MCV: 95.7 fL (ref 80.0–100.0)
Monocytes Absolute: 0.8 10*3/uL (ref 0.1–1.0)
Monocytes Relative: 13 %
Neutro Abs: 4.1 10*3/uL (ref 1.7–7.7)
Neutrophils Relative %: 66 %
Platelets: 137 10*3/uL — ABNORMAL LOW (ref 150–400)
RBC: 3.22 MIL/uL — ABNORMAL LOW (ref 4.22–5.81)
RDW: 13.5 % (ref 11.5–15.5)
WBC: 6.4 10*3/uL (ref 4.0–10.5)
nRBC: 0 % (ref 0.0–0.2)

## 2019-11-20 LAB — GLUCOSE, CAPILLARY
Glucose-Capillary: 188 mg/dL — ABNORMAL HIGH (ref 70–99)
Glucose-Capillary: 183 mg/dL — ABNORMAL HIGH (ref 70–99)
Glucose-Capillary: 163 mg/dL — ABNORMAL HIGH (ref 70–99)
Glucose-Capillary: 162 mg/dL — ABNORMAL HIGH (ref 70–99)

## 2019-11-20 LAB — BASIC METABOLIC PANEL
Anion gap: 9 (ref 5–15)
BUN: 20 mg/dL (ref 8–23)
CO2: 23 mmol/L (ref 22–32)
Calcium: 8.3 mg/dL — ABNORMAL LOW (ref 8.9–10.3)
Chloride: 103 mmol/L (ref 98–111)
Creatinine, Ser: 0.98 mg/dL (ref 0.61–1.24)
GFR calc Af Amer: >60 mL/min (ref >60)
GFR calc non Af Amer: >60 mL/min (ref >60)
Glucose, Bld: 183 mg/dL — ABNORMAL HIGH (ref 70–99)
Potassium: 3.7 mmol/L (ref 3.5–5.1)
Sodium: 135 mmol/L (ref 135–145)

## 2019-11-20 MED ORDER — INSULIN ASPART 100 UNIT/ML ~~LOC~~ SOLN
10.0000 [IU] | Freq: Three times a day (TID) | SUBCUTANEOUS | Status: DC
  Administered 2019-11-21 (×2): 10 [IU] via SUBCUTANEOUS

## 2019-11-20 NOTE — Progress Notes (Signed)
PROGRESS NOTE    Gregory Cummings  [id number] DOB: 07-05-1946 DOA: 11/18/2019 PCP: [name], MD   Brief Narrative:  Patient is a 73 year old male with history of bladder cancer status post robotic cystectomy in October this year, status post urostomy, coronary disease, diabetes, hypertension, GERD, hyperlipidemia who presents to the emergency department with complaints of fever and weakness at home.  Complained of decreased appetite.  He reported cloudy urine in the urostomy bag.  Patient was admitted for with suspicion for sepsis.  Started on broad-spectrum antibiotics.  Blood culture now showing Klebsiella.  Assessment & Plan:   Principal Problem:   Sepsis secondary to UTI [facility]) Active Problems:   Malignant neoplasm of urinary bladder ([facility])   Hyponatremia   CAD (coronary artery disease)   DM2 (diabetes mellitus, type 2) ([facility])   HTN (hypertension)   Gram-negative bacteremia: Most likely secondary to urinary tract infection.  He is status post robotic cystectomy and is urostomy.  Presented with generalized weakness, fever.  Currently hemodynamically stable.  Preliminary blood cultures showing Klebsiella Oxytoa.  Will follow final culture report.  Continue ceftriaxone 2 g daily. Continue gentle IV fluids  History of bladder cancer: Has urostomy.  Status post robotic cystectomy.  Follows with urology.  History of coronary artery disease: Currently stable.  Continue home meds  Diabetes type 2: Continue sliding scale insulin with long-acting insulin.  Continue to monitor CBGs  Hypertension: Continue current medicines.  Currently blood pressure stable  Hyponatremia: Stable now.  Nutrition Problem: Increased nutrient needs Etiology: acute illness(sepsis secondary to UTI)      DVT prophylaxis: Heparin  Code Status: Full Family Communication: None present at the bedside Disposition Plan: Home after final blood culture report   Consultants: None  Procedures: None   Antimicrobials:  Anti-infectives (From admission, onward)   Start     Dose/Rate Route Frequency Ordered Stop   11/19/19 1600  cefTRIAXone (ROCEPHIN) 1 g in sodium chloride 0.9 % 100 mL IVPB  Status:  Discontinued     1 g 200 mL/hr over 30 Minutes Intravenous Every 24 hours 11/19/19 0230 11/19/19 1241   11/19/19 1600  cefTRIAXone (ROCEPHIN) 2 g in sodium chloride 0.9 % 100 mL IVPB     2 g 200 mL/hr over 30 Minutes Intravenous Every 24 hours 11/19/19 1241     11/18/19 1545  cefTRIAXone (ROCEPHIN) 1 g in sodium chloride 0.9 % 100 mL IVPB     1 g 200 mL/hr over 30 Minutes Intravenous  Once 11/18/19 1533 11/18/19 1636      Subjective: Patient seen and examined the bedside this morning.  Hemodynamically stable.  Had mild grade fever earlier this morning.  Denies any new complaints..  Objective: Vitals:   11/19/19 1701 11/19/19 2025 11/20/19 0459 11/20/19 1332  BP: 131/73 119/62 (!) 148/86 122/84  Pulse: 86 78 79 73  Resp: 16 16 20 16   Temp: (!) 100.4 F (38 C) 99.3 F (37.4 C) 100.1 F (37.8 C) 98.4 F (36.9 C)  TempSrc: Oral Oral Oral Oral  SpO2: 97% 98% 96% 99%  Weight:      Height:        Intake/Output Summary (Last 24 hours) at 11/20/2019 1340 Last data filed at 11/20/2019 1100 Gross per 24 hour  Intake 2280.19 ml  Output 1900 ml  Net 380.19 ml   Filed Weights   11/18/19 1407 11/18/19 2322  Weight: 96 kg 92.7 kg    Examination:  General exam: Appears calm and comfortable ,Not in distress  HEENT:PERRL,Oral mucosa moist, Ear/Nose normal on gross exam Respiratory system: Bilateral equal air entry, normal vesicular breath sounds, no wheezes or crackles  Cardiovascular system: S1 & S2 heard, RRR. No JVD, murmurs, rubs, gallops or clicks. No pedal edema. Gastrointestinal system: Abdomen is nondistended, soft and nontender. No organomegaly or masses felt. Normal bowel sounds heard.Urostomy with clear urine Central nervous system: Alert and oriented. No focal neurological  deficits. Extremities: No edema, no clubbing ,no cyanosis, distal peripheral pulses palpable. Skin: No rashes, lesions or ulcers,no icterus ,no pallor  Data Reviewed: I have personally reviewed following labs and imaging studies  CBC: Recent Labs  Lab 11/18/19 1510 11/19/19 0659 11/20/19 0457  WBC 10.9* 9.5 6.4  NEUTROABS 8.6*  --  4.1  HGB 11.5* 10.8* 10.0*  HCT 35.5* 33.1* 30.8*  MCV 93.9 95.7 95.7  PLT 186 147* 0000000*   Basic Metabolic Panel: Recent Labs  Lab 11/18/19 1510 11/19/19 0659 11/20/19 0457  NA 130* 133* 135  K 3.8 4.1 3.7  CL 99 100 103  CO2 23 21* 23  GLUCOSE 113* 245* 183*  BUN 22 25* 20  CREATININE 1.43* 1.08 0.98  CALCIUM 9.2 8.5* 8.3*   GFR: Estimated Creatinine Clearance: 73.2 mL/min (by C-G formula based on SCr of 0.98 mg/dL). Liver Function Tests: Recent Labs  Lab 11/18/19 1510 11/19/19 0659  AST 16 14*  ALT 15 14  ALKPHOS 43 44  BILITOT 1.3* 1.1  PROT 7.2 6.4*  ALBUMIN 3.6 3.0*   No results for input(s): LIPASE, AMYLASE in the last 168 hours. No results for input(s): AMMONIA in the last 168 hours. Coagulation Profile: Recent Labs  Lab 11/18/19 1510  INR 1.1   Cardiac Enzymes: No results for input(s): CKTOTAL, CKMB, CKMBINDEX, TROPONINI in the last 168 hours. BNP (last 3 results) No results for input(s): PROBNP in the last 8760 hours. HbA1C: No results for input(s): HGBA1C in the last 72 hours. CBG: Recent Labs  Lab 11/19/19 1148 11/19/19 1636 11/19/19 2025 11/20/19 0739 11/20/19 1149  GLUCAP 295* 186* 194* 188* 183*   Lipid Profile: No results for input(s): CHOL, HDL, LDLCALC, TRIG, CHOLHDL, LDLDIRECT in the last 72 hours. Thyroid Function Tests: No results for input(s): TSH, T4TOTAL, FREET4, T3FREE, THYROIDAB in the last 72 hours. Anemia Panel: No results for input(s): VITAMINB12, FOLATE, FERRITIN, TIBC, IRON, RETICCTPCT in the last 72 hours. Sepsis Labs: Recent Labs  Lab 11/18/19 1526 11/18/19 1930  LATICACIDVEN  1.3 0.9    Recent Results (from the past 240 hour(s))  Culture, blood (Routine x 2)     Status: Abnormal (Preliminary result)   Collection Time: 11/18/19  3:10 PM   Specimen: BLOOD  Result Value Ref Range Status   Specimen Description   Final    BLOOD LEFT ANTECUBITAL Performed at [facility] [address] [place], [place] [address]    Special Requests   Final    BOTTLES DRAWN AEROBIC AND ANAEROBIC Blood Culture results may not be optimal due to an excessive volume of blood received in culture bottles Performed at [facility] [address], [place], [place] [address]    Culture  Setup Time   Final    GRAM NEGATIVE RODS IN BOTH AEROBIC AND ANAEROBIC BOTTLES CRITICAL RESULT CALLED TO, READ BACK BY AND VERIFIED WITH: Elenore Paddy PharmD 12:25 11/19/19 ([email or web address]) Performed at [facility], [address] [place], [place] [address]    Culture KLEBSIELLA OXYTOCA (A)  Final   Report Status PENDING  Incomplete  Blood Culture ID Panel (Reflexed)     Status: Abnormal   Collection Time: 11/18/19  3:10 PM  Result Value Ref Range Status   Enterococcus species NOT DETECTED NOT DETECTED Final   Listeria monocytogenes NOT DETECTED NOT DETECTED Final   Staphylococcus species NOT DETECTED NOT DETECTED Final   Staphylococcus aureus (BCID) NOT DETECTED NOT DETECTED Final   Streptococcus species NOT DETECTED NOT DETECTED Final   Streptococcus agalactiae NOT DETECTED NOT DETECTED Final   Streptococcus pneumoniae NOT DETECTED NOT DETECTED Final   Streptococcus pyogenes NOT DETECTED NOT DETECTED Final   Acinetobacter baumannii NOT DETECTED NOT DETECTED Final   Enterobacteriaceae species DETECTED (A) NOT DETECTED Final    Comment: Enterobacteriaceae represent a large family of gram-negative bacteria, not a single organism. CRITICAL RESULT CALLED TO, READ BACK BY AND VERIFIED WITH: Elenore Paddy PharmD 12:25 11/19/19 ([email or web address])    Enterobacter cloacae  complex NOT DETECTED NOT DETECTED Final   Escherichia coli NOT DETECTED NOT DETECTED Final   Klebsiella oxytoca DETECTED (A) NOT DETECTED Final    Comment: CRITICAL RESULT CALLED TO, READ BACK BY AND VERIFIED WITH: Elenore Paddy PharmD 12:25 11/19/19 ([email or web address])    Klebsiella pneumoniae NOT DETECTED NOT DETECTED Final   Proteus species NOT DETECTED NOT DETECTED Final   Serratia marcescens NOT DETECTED NOT DETECTED Final   Carbapenem resistance NOT DETECTED NOT DETECTED Final   Haemophilus influenzae NOT DETECTED NOT DETECTED Final   Neisseria meningitidis NOT DETECTED NOT DETECTED Final   Pseudomonas aeruginosa NOT DETECTED NOT DETECTED Final   Candida albicans NOT DETECTED NOT DETECTED Final   Candida glabrata NOT DETECTED NOT DETECTED Final   Candida krusei NOT DETECTED NOT DETECTED Final   Candida parapsilosis NOT DETECTED NOT DETECTED Final   Candida tropicalis NOT DETECTED NOT DETECTED Final    Comment: Performed at [facility], [place] [address] [place], [place] [address]  Culture, blood (Routine x 2)     Status: None (Preliminary result)   Collection Time: 11/18/19  3:30 PM   Specimen: BLOOD  Result Value Ref Range Status   Specimen Description   Final    BLOOD PORTA CATH Performed at [facility] [address] [place], [place] [address]    Special Requests   Final    BOTTLES DRAWN AEROBIC AND ANAEROBIC Blood Culture adequate volume Performed at [facility] [address], [place], [place] [address]    Culture   Final    NO GROWTH < 12 HOURS Performed at [facility] [address] [place], [place] [address]    Report Status PENDING  Incomplete  Urine culture     Status: Abnormal   Collection Time: 11/18/19  8:13 PM   Specimen: Urine, Random  Result Value Ref Range Status   Specimen Description URINE, RANDOM  Final   Special Requests   Final    BAG PEDI Performed at [facility], [place] [address]  [place], [place] [address]    Culture MULTIPLE SPECIES PRESENT, SUGGEST RECOLLECTION (A)  Final   Report Status 11/19/2019 FINAL  Final         Radiology Studies: Dg Chest 2 View  Result Date: 11/18/2019 CLINICAL DATA:  Sepsis. EXAM: CHEST - 2 VIEW COMPARISON:  October 28, 2019 FINDINGS: There is a well-positioned right-sided Port-A-Cath. The heart size is unchanged from prior study. There is atelectasis at the lung bases. There is no pneumothorax. No large pleural effusion. There is no  acute osseous abnormality. A coronary stent is noted. IMPRESSION: No active cardiopulmonary disease. Electronically Signed   By: Constance Holster M.D.   On: 11/18/2019 16:28        Scheduled Meds: . aspirin  81 mg Oral Daily  . carvedilol  3.125 mg Oral Daily  . Chlorhexidine Gluconate Cloth  6 each Topical Daily  . feeding supplement (GLUCERNA SHAKE)  237 mL Oral TID BM  . heparin  5,000 Units Subcutaneous Q8H  . insulin aspart  0-20 Units Subcutaneous TID WC  . insulin aspart  0-5 Units Subcutaneous QHS  . insulin aspart  10 Units Subcutaneous TID WC  . insulin glargine  20 Units Subcutaneous Daily  . lisinopril  5 mg Oral Daily  . multivitamin with minerals  1 tablet Oral Daily  . simvastatin  20 mg Oral QHS  . sodium chloride flush  10-40 mL Intracatheter Q12H   Continuous Infusions: . sodium chloride 75 mL/hr at 11/20/19 0600  . cefTRIAXone (ROCEPHIN)  IV 2 g (11/19/19 1555)     LOS: 2 days    Time spent: 25 mins.More than 50% of that time was spent in counseling and/or coordination of care.      Shelly Coss, MD [facility] Pager [phone]  If 7PM-7AM, please contact night-coverage [email or web address] Password [email or web address] 11/20/2019, 1:40 PM

## 2019-11-20 NOTE — Consult Note (Signed)
[facility] Nurse ostomy consult note I was stopped in the hallway regarding this patient.  Please place consult.  Stoma type/location: RLQ ileal conduit Stomal assessment/size: 1 3/8" pink and moist  Draining yellow cloudy urine Peristomal assessment: not assessed. Pouch is taped down.  Patient seen at 35 and refused for me to change pouch until girlfriend arrived with home supplies.  He thought she may have questions.  Informed him that bedside nurse could answer questions while girlfriend changed pouch as I would not be on this campus this afternoon.  Upon arrival, bedside RN would like ostomy nurse assistance.  I called the room upon request and girlfriend is changing the pouch today but would like in person assist with questions pertaining to bedside bag.  Will ask my partners to see him tomorrow after 12 per her request.  Girlfriend states she has all needed supplies.  [facility] team will follow.  Domenic Moras MSN, RN, FNP-BC CWON Wound, Ostomy, Continence Nurse Pager [phone]

## 2019-11-21 LAB — CULTURE, BLOOD (ROUTINE X 2)

## 2019-11-21 LAB — GLUCOSE, CAPILLARY
Glucose-Capillary: 153 mg/dL — ABNORMAL HIGH (ref 70–99)
Glucose-Capillary: 225 mg/dL — ABNORMAL HIGH (ref 70–99)

## 2019-11-21 MED ORDER — HEPARIN SOD (PORK) LOCK FLUSH 100 UNIT/ML IV SOLN
500.0000 [IU] | INTRAVENOUS | Status: AC | PRN
  Administered 2019-11-21: 500 [IU]

## 2019-11-21 MED ORDER — LEVOFLOXACIN 750 MG PO TABS: 750.0000 mg | ORAL_TABLET | Freq: Every day | ORAL | 0 refills | Status: DC

## 2019-11-21 MED ORDER — LEVOFLOXACIN 750 MG PO TABS
750.0000 mg | ORAL_TABLET | Freq: Every day | ORAL | Status: DC
  Administered 2019-11-21: 11:00:00 750 mg via ORAL
  Filled 2019-11-21: qty 1

## 2019-11-21 NOTE — Consult Note (Signed)
[facility] Nurse ostomy follow up Patient receiving care in [place]. Stoma type/location: RUQ urostomy Ostomy pouching: 1pc. Coloplast per girlfriend C. Maness. Education provided: how to cleanse the bedside drainage bag with 1/2 strength white vinegar. Enrolled patient in [place] Discharge program: Yes, previously.  The patient did not have any questions related to his urostomy care, however, his girlfriend did.  Her name is Information systems manager.  I explained I was unaware of her desire to speak in person today after 12, and asked if perhaps I could speak with her over the phone.  She graciously agreed to talk with me over the phone and accepted my apology for not being aware of the request to talk with me in person after 12 today.  Ms. Ashley Jacobs was well organized with her questions and asked them according to her list.  We had a very detailed, lovely conversation that lasted for approximately 45 minutes.  At the conclusion of our Q&A session I asked if I had answered her questions to her satisfaction.  She answered that I had, it had been very helpful and informative, and expressed her gratitude for the information I provided.  I provided the patient with one and two piece urostomy pouches, barrier rings, and two adapters, along with a leg bag should he chose to try one.  He had no interest in learning about the leg bag at the time of my visit, but agreed to take one in case he wanted to try it later. The patient and Ms. Maness are independent in the ostomy care. Val Riles, RN, MSN, CWOCN, CNS-BC, pager [phone]

## 2019-11-21 NOTE — Progress Notes (Signed)
IV team has come to room and attempted to redress Porta cath site Dressing loose> pt refuses for IV team to redress site

## 2019-11-21 NOTE — Discharge Summary (Signed)
Physician Discharge Summary  Gregory Cummings [id number] DOB: 1946-11-06 DOA: 11/18/2019  PCP: [name], MD  Admit date: 11/18/2019 Discharge date: 11/21/2019  Admitted From: Home Disposition:  Home  Discharge Condition:Stable CODE STATUS:FULL Diet recommendation: Heart Healthy  Brief/Interim Summary:  Patient is a 73 year old male with history of bladder cancer status post robotic cystectomy in October this year, status post urostomy, coronary disease, diabetes, hypertension, GERD, hyperlipidemia who presents to the emergency department with complaints of fever and weakness at home.  Complained of decreased appetite.  He reported cloudy urine in the urostomy bag.  Patient was admitted for with suspicion for sepsis.  Started on broad-spectrum antibiotics.  Blood culture now showing Klebsiella Oxytoca.  Patient remains hemodynamically stable.  Antibiotics changed to oral today.  He is medically stable for discharge to home today.  Following problems were addressed during his hospitalization:  Gram-negative bacteremia: From urinary source.  He is status post robotic cystectomy and is urostomy.  Presented with generalized weakness, fever.  Currently hemodynamically stable.  Blood cultures showed Klebsiella Oxytoca.He was on ceftriaxone 2 g daily. Abx changed to Levaquin which we will continue for 7 days.  History of bladder cancer: Has urostomy.  Status post robotic cystectomy.  Follows with urology.  History of coronary artery disease: Currently stable.  Continue home meds  Diabetes type 2: Continue sliding scale insulin with long-acting insulin.  Continue to monitor CBGs  Hypertension: Continue current medicines.  Currently blood pressure stable  Hyponatremia: Stable now.  Discharge Diagnoses:  Principal Problem:   Sepsis secondary to UTI [facility]) Active Problems:   Malignant neoplasm of urinary bladder (HCC)   Hyponatremia   CAD (coronary artery disease)   DM2  (diabetes mellitus, type 2) (HCC)   HTN (hypertension)    Discharge Instructions  Discharge Instructions    Diet - low sodium heart healthy   Complete by: As directed    Discharge instructions   Complete by: As directed    1)Please follow up with your PCP in a week. 2)Take prescribed medications as instructed.   Increase activity slowly   Complete by: As directed      Allergies as of 11/21/2019      Reactions   No Known Allergies       Medication List    STOP taking these medications   cefUROXime 500 MG tablet Commonly known as: CEFTIN     TAKE these medications   acetaminophen 500 MG tablet Commonly known as: TYLENOL Take 500 mg by mouth every 6 (six) hours as needed for mild pain, moderate pain or headache.   aspirin 81 MG tablet Take 81 mg by mouth daily.   carvedilol 3.125 MG tablet Commonly known as: COREG Take 3.125 mg by mouth daily.   famotidine 10 MG tablet Commonly known as: PEPCID Take 10 mg by mouth 2 (two) times daily as needed for heartburn or indigestion.   insulin glargine 100 UNIT/ML injection Commonly known as: LANTUS Inject 0.2 mLs (20 Units total) into the skin daily.   insulin regular 100 units/mL injection Commonly known as: NOVOLIN R Inject 30-100 Units into the skin 3 (three) times daily before meals. Sliding Scale Depended on the glucose reading and meal plan for the next two hours The patient has a Libre device on his arm   levofloxacin 750 MG tablet Commonly known as: LEVAQUIN Take 1 tablet (750 mg total) by mouth daily. Start taking on: November 22, 2019   lidocaine-prilocaine cream Commonly known as: EMLA Apply 1 application  topically as needed.   lisinopril 5 MG tablet Commonly known as: ZESTRIL Take 1 tablet (5 mg total) by mouth daily. For future refills, please discuss with your PCP.   ondansetron 8 MG tablet Commonly known as: ZOFRAN Take 8 mg by mouth 2 (two) times daily as needed for nausea or vomiting.    simvastatin 20 MG tablet Commonly known as: ZOCOR Take 20 mg by mouth at bedtime.      Follow-up Information    [name], [name], MD. Schedule an appointment as soon as possible for a visit in 1 week(s).   Specialty: Internal Medicine Contact information: [address] [place]  [address] [phone]          Allergies  Allergen Reactions  . No Known Allergies     Consultations:  None   Procedures/Studies: Dg Chest 2 View  Result Date: 11/18/2019 CLINICAL DATA:  Sepsis. EXAM: CHEST - 2 VIEW COMPARISON:  October 28, 2019 FINDINGS: There is a well-positioned right-sided Port-A-Cath. The heart size is unchanged from prior study. There is atelectasis at the lung bases. There is no pneumothorax. No large pleural effusion. There is no acute osseous abnormality. A coronary stent is noted. IMPRESSION: No active cardiopulmonary disease. Electronically Signed   By: Constance Holster M.D.   On: 11/18/2019 16:28   Ct Abdomen Pelvis W Contrast  Result Date: 10/29/2019 CLINICAL DATA:  Diarrhea with fever of unknown origin. History of urothelial carcinoma status post laparoscopic Cysto prostatectomy with ileal conduit diversion. EXAM: CT ABDOMEN AND PELVIS WITH CONTRAST TECHNIQUE: Multidetector CT imaging of the abdomen and pelvis was performed using the standard protocol following bolus administration of intravenous contrast. CONTRAST:  19mL OMNIPAQUE IOHEXOL 300 MG/ML SOLN, 54mL OMNIPAQUE IOHEXOL 300 MG/ML SOLN COMPARISON:  08/28/2019 FINDINGS: Lower chest: Dependent changes are identified within both lung bases. Trace pleural effusions noted right greater than left. Hepatobiliary: No focal liver abnormality identified. Gallbladder is collapsed. Pancreas: Unremarkable. No pancreatic ductal dilatation or surrounding inflammatory changes. Spleen: Normal in size without focal abnormality. Adrenals/Urinary Tract: Normal appearance of the adrenal glands. Right kidney cysts  measures 1.5 cm, image 33/2. Mild bilateral hydronephrosis identified. Duplicated left renal collecting system. Status post Cysto prostatectomy with right abdominal diverting urostomy. Stomach/Bowel: Stomach normal. No dilated loops of small or large bowel identified. Within the right lower quadrant of the abdomen in the region of the loop ileostomy there are several loops of small bowel which exhibit mild wall thickening/inflammation. Dilute contrast is identified within the cecum compatible with small bowel patency. No abnormal large bowel wall thickening, inflammation or distension. Vascular/Lymphatic: Aortic atherosclerosis without aneurysm. No abdominal adenopathy. No iliac adenopathy. Prominent left inguinal lymph node measures 9 mm short axis. Reproductive: Status post prostatectomy. Other: There is free fluid identified within the abdomen and pelvis. No discrete fluid collection identified. Free fluid extends over the scratch set a small amount of perihepatic free fluid is noted between the dome and right hemidiaphragm. Subcutaneous gas is identified within bilateral inguinal regions, image 91/2 and image 88/2. There is also subcutaneous gas within the abdominal wall bilaterally. Musculoskeletal: No acute or significant osseous findings. IMPRESSION: 1. Status post Cysto prostatectomy with right abdominal diverting loop ileostomy. 2. There are several loops of small bowel within the right lower quadrant of the abdomen in the region of the loop ileostomy which exhibits mild wall thickening/inflammation. Findings may reflect an inflammatory or infectious enteritis. No evidence for bowel obstruction. 3. There is a small amount of free fluid identified  within the abdomen and pelvis. No discrete fluid collection identified. 4. Mild bilateral hydronephrosis. No striated nephrograms identified. No renal abscess noted. 5. Subcutaneous gas within the abdominal wall in bilateral inguinal regions may be related to  recent surgery. 6. Aortic atherosclerosis. Aortic Atherosclerosis (ICD10-I70.0). Electronically Signed   By: Kerby Moors M.D.   On: 10/29/2019 17:30   Dg Chest Port 1 View  Result Date: 10/28/2019 CLINICAL DATA:  Fever.  Fatigue.  Bladder carcinoma. EXAM: PORTABLE CHEST 1 VIEW COMPARISON:  06/30/2015 FINDINGS: Heart size is normal. Right-sided Port-A-Cath is seen in appropriate position. Both lungs are clear. IMPRESSION: No active disease. Electronically Signed   By: Marlaine Hind M.D.   On: 10/28/2019 18:10       Subjective: Patient seen and examined the bedside this morning.  Hemodynamically stable for discharge today to home.  Discharge Exam: Vitals:   11/21/19 0614 11/21/19 1013  BP: (!) 146/86 (!) 145/82  Pulse: 80 84  Resp: 18   Temp: 98.7 F (37.1 C)   SpO2: 97%    Vitals:   11/20/19 1332 11/20/19 2019 11/21/19 0614 11/21/19 1013  BP: 122/84 127/87 (!) 146/86 (!) 145/82  Pulse: 73 71 80 84  Resp: 16 18 18    Temp: 98.4 F (36.9 C) 99 F (37.2 C) 98.7 F (37.1 C)   TempSrc: Oral Oral Oral   SpO2: 99% 99% 97%   Weight:      Height:        General: Pt is alert, awake, not in acute distress Cardiovascular: RRR, S1/S2 +, no rubs, no gallops Respiratory: CTA bilaterally, no wheezing, no rhonchi Abdominal: Soft, NT, ND, bowel sounds +,urostomy Extremities: no edema, no cyanosis    The results of significant diagnostics from this hospitalization (including imaging, microbiology, ancillary and laboratory) are listed below for reference.     Microbiology: Recent Results (from the past 240 hour(s))  Culture, blood (Routine x 2)     Status: Abnormal   Collection Time: 11/18/19  3:10 PM   Specimen: BLOOD  Result Value Ref Range Status   Specimen Description   Final    BLOOD LEFT ANTECUBITAL Performed at [facility] [address] [place], [place] [address]    Special Requests   Final    BOTTLES DRAWN AEROBIC AND ANAEROBIC Blood Culture  results may not be optimal due to an excessive volume of blood received in culture bottles Performed at [facility] [address], [place], [place] [address]    Culture  Setup Time   Final    GRAM NEGATIVE RODS IN BOTH AEROBIC AND ANAEROBIC BOTTLES CRITICAL RESULT CALLED TO, READ BACK BY AND VERIFIED WITH: Elenore Paddy PharmD 12:25 11/19/19 ([email or web address]) Performed at [facility], [address], [place], [place] [address]    Culture KLEBSIELLA OXYTOCA (A)  Final   Report Status 11/21/2019 FINAL  Final   Organism ID, Bacteria KLEBSIELLA OXYTOCA  Final      Susceptibility   Klebsiella oxytoca - MIC*    AMPICILLIN >=32 RESISTANT Resistant     CEFAZOLIN >=64 RESISTANT Resistant     CEFEPIME <=1 SENSITIVE Sensitive     CEFTAZIDIME <=1 SENSITIVE Sensitive     CEFTRIAXONE <=1 SENSITIVE Sensitive     CIPROFLOXACIN <=0.25 SENSITIVE Sensitive     GENTAMICIN <=1 SENSITIVE Sensitive     IMIPENEM <=0.25 SENSITIVE Sensitive     TRIMETH/SULFA <=20 SENSITIVE Sensitive     AMPICILLIN/SULBACTAM 16 INTERMEDIATE Intermediate  PIP/TAZO <=4 SENSITIVE Sensitive     Extended ESBL NEGATIVE Sensitive     * KLEBSIELLA OXYTOCA  Blood Culture ID Panel (Reflexed)     Status: Abnormal   Collection Time: 11/18/19  3:10 PM  Result Value Ref Range Status   Enterococcus species NOT DETECTED NOT DETECTED Final   Listeria monocytogenes NOT DETECTED NOT DETECTED Final   Staphylococcus species NOT DETECTED NOT DETECTED Final   Staphylococcus aureus (BCID) NOT DETECTED NOT DETECTED Final   Streptococcus species NOT DETECTED NOT DETECTED Final   Streptococcus agalactiae NOT DETECTED NOT DETECTED Final   Streptococcus pneumoniae NOT DETECTED NOT DETECTED Final   Streptococcus pyogenes NOT DETECTED NOT DETECTED Final   Acinetobacter baumannii NOT DETECTED NOT DETECTED Final   Enterobacteriaceae species DETECTED (A) NOT DETECTED Final    Comment: Enterobacteriaceae represent a large family  of gram-negative bacteria, not a single organism. CRITICAL RESULT CALLED TO, READ BACK BY AND VERIFIED WITH: Elenore Paddy PharmD 12:25 11/19/19 ([email or web address])    Enterobacter cloacae complex NOT DETECTED NOT DETECTED Final   Escherichia coli NOT DETECTED NOT DETECTED Final   Klebsiella oxytoca DETECTED (A) NOT DETECTED Final    Comment: CRITICAL RESULT CALLED TO, READ BACK BY AND VERIFIED WITH: Elenore Paddy PharmD 12:25 11/19/19 ([email or web address])    Klebsiella pneumoniae NOT DETECTED NOT DETECTED Final   Proteus species NOT DETECTED NOT DETECTED Final   Serratia marcescens NOT DETECTED NOT DETECTED Final   Carbapenem resistance NOT DETECTED NOT DETECTED Final   Haemophilus influenzae NOT DETECTED NOT DETECTED Final   Neisseria meningitidis NOT DETECTED NOT DETECTED Final   Pseudomonas aeruginosa NOT DETECTED NOT DETECTED Final   Candida albicans NOT DETECTED NOT DETECTED Final   Candida glabrata NOT DETECTED NOT DETECTED Final   Candida krusei NOT DETECTED NOT DETECTED Final   Candida parapsilosis NOT DETECTED NOT DETECTED Final   Candida tropicalis NOT DETECTED NOT DETECTED Final    Comment: Performed at [facility], [place] [address] [place], [place] [address]  Culture, blood (Routine x 2)     Status: None (Preliminary result)   Collection Time: 11/18/19  3:30 PM   Specimen: BLOOD  Result Value Ref Range Status   Specimen Description   Final    BLOOD PORTA CATH Performed at [facility] [address] [place], [place] [address]    Special Requests   Final    BOTTLES DRAWN AEROBIC AND ANAEROBIC Blood Culture adequate volume Performed at [facility] [address], [place], [place] [address]    Culture   Final    NO GROWTH 3 DAYS Performed at [facility], [place] [address] [place], [place] [address]    Report Status PENDING  Incomplete  Urine culture     Status: Abnormal   Collection Time: 11/18/19  8:13 PM   Specimen: Urine, Random   Result Value Ref Range Status   Specimen Description URINE, RANDOM  Final   Special Requests   Final    BAG PEDI Performed at [facility], [place] [address], [place], [place] [address]    Culture MULTIPLE SPECIES PRESENT, SUGGEST RECOLLECTION (A)  Final   Report Status 11/19/2019 FINAL  Final     Labs: BNP (last 3 results) No results for input(s): BNP in the last 8760 hours. Basic Metabolic Panel: Recent Labs  Lab 11/18/19 1510 11/19/19 0659 11/20/19 0457  NA 130* 133* 135  K 3.8 4.1 3.7  CL 99 100 103  CO2 23 21* 23  GLUCOSE 113* 245* 183*  BUN 22 25* 20  CREATININE 1.43* 1.08 0.98  CALCIUM 9.2 8.5* 8.3*   Liver Function Tests: Recent Labs  Lab 11/18/19 1510 11/19/19 0659  AST 16 14*  ALT 15 14  ALKPHOS 43 44  BILITOT 1.3* 1.1  PROT 7.2 6.4*  ALBUMIN 3.6 3.0*   No results for input(s): LIPASE, AMYLASE in the last 168 hours. No results for input(s): AMMONIA in the last 168 hours. CBC: Recent Labs  Lab 11/18/19 1510 11/19/19 0659 11/20/19 0457  WBC 10.9* 9.5 6.4  NEUTROABS 8.6*  --  4.1  HGB 11.5* 10.8* 10.0*  HCT 35.5* 33.1* 30.8*  MCV 93.9 95.7 95.7  PLT 186 147* 137*   Cardiac Enzymes: No results for input(s): CKTOTAL, CKMB, CKMBINDEX, TROPONINI in the last 168 hours. BNP: Invalid input(s): POCBNP CBG: Recent Labs  Lab 11/20/19 0739 11/20/19 1149 11/20/19 1631 11/20/19 2016 11/21/19 0746  GLUCAP 188* 183* 163* 162* 153*   D-Dimer No results for input(s): DDIMER in the last 72 hours. Hgb A1c No results for input(s): HGBA1C in the last 72 hours. Lipid Profile No results for input(s): CHOL, HDL, LDLCALC, TRIG, CHOLHDL, LDLDIRECT in the last 72 hours. Thyroid function studies No results for input(s): TSH, T4TOTAL, T3FREE, THYROIDAB in the last 72 hours.  Invalid input(s): FREET3 Anemia work up No results for input(s): VITAMINB12, FOLATE, FERRITIN, TIBC, IRON, RETICCTPCT in the last 72 hours. Urinalysis    Component Value Date/Time    COLORURINE YELLOW 11/18/2019 1924   APPEARANCEUR HAZY (A) 11/18/2019 1924   LABSPEC 1.014 11/18/2019 1924   PHURINE 6.0 11/18/2019 1924   GLUCOSEU NEGATIVE 11/18/2019 1924   HGBUR SMALL (A) 11/18/2019 [place] NEGATIVE 11/18/2019 [place] NEGATIVE 11/18/2019 1924   PROTEINUR 100 (A) 11/18/2019 1924   NITRITE NEGATIVE 11/18/2019 1924   LEUKOCYTESUR LARGE (A) 11/18/2019 1924   Sepsis Labs Invalid input(s): PROCALCITONIN,  WBC,  LACTICIDVEN Microbiology Recent Results (from the past 240 hour(s))  Culture, blood (Routine x 2)     Status: Abnormal   Collection Time: 11/18/19  3:10 PM   Specimen: BLOOD  Result Value Ref Range Status   Specimen Description   Final    BLOOD LEFT ANTECUBITAL Performed at [facility], [place] [address] [place], [place] [address]    Special Requests   Final    BOTTLES DRAWN AEROBIC AND ANAEROBIC Blood Culture results may not be optimal due to an excessive volume of blood received in culture bottles Performed at [facility] [address] [place], [place] [address]    Culture  Setup Time   Final    GRAM NEGATIVE RODS IN BOTH AEROBIC AND ANAEROBIC BOTTLES CRITICAL RESULT CALLED TO, READ BACK BY AND VERIFIED WITH: Elenore Paddy PharmD 12:25 11/19/19 ([email or web address]) Performed at [facility], [address] [place], [place] [address]    Culture KLEBSIELLA OXYTOCA (A)  Final   Report Status 11/21/2019 FINAL  Final   Organism ID, Bacteria KLEBSIELLA OXYTOCA  Final      Susceptibility   Klebsiella oxytoca - MIC*    AMPICILLIN >=32 RESISTANT Resistant     CEFAZOLIN >=64 RESISTANT Resistant     CEFEPIME <=1 SENSITIVE Sensitive     CEFTAZIDIME <=1 SENSITIVE Sensitive     CEFTRIAXONE <=1 SENSITIVE Sensitive     CIPROFLOXACIN <=0.25 SENSITIVE Sensitive     GENTAMICIN <=1 SENSITIVE Sensitive     IMIPENEM <=0.25  SENSITIVE Sensitive     TRIMETH/SULFA <=20 SENSITIVE Sensitive     AMPICILLIN/SULBACTAM  16 INTERMEDIATE Intermediate     PIP/TAZO <=4 SENSITIVE Sensitive     Extended ESBL NEGATIVE Sensitive     * KLEBSIELLA OXYTOCA  Blood Culture ID Panel (Reflexed)     Status: Abnormal   Collection Time: 11/18/19  3:10 PM  Result Value Ref Range Status   Enterococcus species NOT DETECTED NOT DETECTED Final   Listeria monocytogenes NOT DETECTED NOT DETECTED Final   Staphylococcus species NOT DETECTED NOT DETECTED Final   Staphylococcus aureus (BCID) NOT DETECTED NOT DETECTED Final   Streptococcus species NOT DETECTED NOT DETECTED Final   Streptococcus agalactiae NOT DETECTED NOT DETECTED Final   Streptococcus pneumoniae NOT DETECTED NOT DETECTED Final   Streptococcus pyogenes NOT DETECTED NOT DETECTED Final   Acinetobacter baumannii NOT DETECTED NOT DETECTED Final   Enterobacteriaceae species DETECTED (A) NOT DETECTED Final    Comment: Enterobacteriaceae represent a large family of gram-negative bacteria, not a single organism. CRITICAL RESULT CALLED TO, READ BACK BY AND VERIFIED WITH: Elenore Paddy PharmD 12:25 11/19/19 ([email or web address])    Enterobacter cloacae complex NOT DETECTED NOT DETECTED Final   Escherichia coli NOT DETECTED NOT DETECTED Final   Klebsiella oxytoca DETECTED (A) NOT DETECTED Final    Comment: CRITICAL RESULT CALLED TO, READ BACK BY AND VERIFIED WITH: Elenore Paddy PharmD 12:25 11/19/19 ([email or web address])    Klebsiella pneumoniae NOT DETECTED NOT DETECTED Final   Proteus species NOT DETECTED NOT DETECTED Final   Serratia marcescens NOT DETECTED NOT DETECTED Final   Carbapenem resistance NOT DETECTED NOT DETECTED Final   Haemophilus influenzae NOT DETECTED NOT DETECTED Final   Neisseria meningitidis NOT DETECTED NOT DETECTED Final   Pseudomonas aeruginosa NOT DETECTED NOT DETECTED Final   Candida albicans NOT DETECTED NOT DETECTED Final   Candida glabrata NOT DETECTED NOT DETECTED Final   Candida krusei NOT DETECTED NOT DETECTED Final   Candida parapsilosis NOT DETECTED NOT  DETECTED Final   Candida tropicalis NOT DETECTED NOT DETECTED Final    Comment: Performed at [facility], [place] [address], [place], [place] [address]  Culture, blood (Routine x 2)     Status: None (Preliminary result)   Collection Time: 11/18/19  3:30 PM   Specimen: BLOOD  Result Value Ref Range Status   Specimen Description   Final    BLOOD PORTA CATH Performed at [facility] [address] [place], [place] [address]    Special Requests   Final    BOTTLES DRAWN AEROBIC AND ANAEROBIC Blood Culture adequate volume Performed at [facility] [address] [place], [place] [address]    Culture   Final    NO GROWTH 3 DAYS Performed at [facility], [place] [address] [place], [place] [address]    Report Status PENDING  Incomplete  Urine culture     Status: Abnormal   Collection Time: 11/18/19  8:13 PM   Specimen: Urine, Random  Result Value Ref Range Status   Specimen Description URINE, RANDOM  Final   Special Requests   Final    BAG PEDI Performed at [facility], [place] [address], [place],  [address]    Culture MULTIPLE SPECIES PRESENT, SUGGEST RECOLLECTION (A)  Final   Report Status 11/19/2019 FINAL  Final    Please note: You were cared for by a hospitalist during your hospital stay. Once you are discharged, your primary care physician will handle  any further medical issues. Please note that NO REFILLS for any discharge medications will be authorized once you are discharged, as it is imperative that you return to your primary care physician (or establish a relationship with a primary care physician if you do not have one) for your post hospital discharge needs so that they can reassess your need for medications and monitor your lab values.    Time coordinating discharge: 40 minutes  SIGNED:   Shelly Coss, MD  [facility] 11/21/2019, 11:05 AM Pager [phone]  If 7PM-7AM, please contact  night-coverage [email or web address] Password [email or web address]

## 2019-11-21 NOTE — Care Management Important Message (Signed)
Important Message  Patient Details IM Letter given to Rhea Pink SW to present to the Patient Name: Gregory Cummings MRN: [id number] Date of Birth: 03/22/1946   Medicare Important Message Given:  Yes     [name] 11/21/2019, 11:57 AM

## 2019-11-21 NOTE — Progress Notes (Signed)
Pt to be discharged to home this afternoon. Pt and Pt's Wife given discharge instructions and Medications and schedules reviewed with Pt. Pt and Wife verbalized understanding of all discharge instructions. Discharge packet with pt at time of discharge

## 2019-11-23 LAB — CULTURE, BLOOD (ROUTINE X 2)
Culture: NO GROWTH
Special Requests: ADEQUATE

## 2019-11-28 ENCOUNTER — Telehealth: Payer: Self-pay | Admitting: Oncology

## 2019-11-28 NOTE — Telephone Encounter (Signed)
Confirmed January appointment with wife.

## 2019-11-30 ENCOUNTER — Other Ambulatory Visit: Payer: Medicare Other

## 2020-01-09 ENCOUNTER — Inpatient Hospital Stay: Payer: Medicare Other

## 2020-01-09 ENCOUNTER — Telehealth: Payer: Self-pay | Admitting: Oncology

## 2020-01-09 ENCOUNTER — Other Ambulatory Visit: Payer: Self-pay

## 2020-01-09 ENCOUNTER — Inpatient Hospital Stay: Payer: Medicare Other | Attending: Oncology | Admitting: Oncology

## 2020-01-09 VITALS — BP 147/83 | HR 81 | Temp 98.0°F | Resp 17 | Ht 69.0 in | Wt 214.9 lb

## 2020-01-09 DIAGNOSIS — Z9221 Personal history of antineoplastic chemotherapy: Secondary | ICD-10-CM | POA: Insufficient documentation

## 2020-01-09 DIAGNOSIS — C679 Malignant neoplasm of bladder, unspecified: Secondary | ICD-10-CM

## 2020-01-09 DIAGNOSIS — Z95828 Presence of other vascular implants and grafts: Secondary | ICD-10-CM

## 2020-01-09 DIAGNOSIS — Z794 Long term (current) use of insulin: Secondary | ICD-10-CM | POA: Insufficient documentation

## 2020-01-09 DIAGNOSIS — Z79899 Other long term (current) drug therapy: Secondary | ICD-10-CM | POA: Insufficient documentation

## 2020-01-09 DIAGNOSIS — Z8551 Personal history of malignant neoplasm of bladder: Secondary | ICD-10-CM | POA: Diagnosis not present

## 2020-01-09 DIAGNOSIS — C670 Malignant neoplasm of trigone of bladder: Secondary | ICD-10-CM

## 2020-01-09 DIAGNOSIS — Z7982 Long term (current) use of aspirin: Secondary | ICD-10-CM | POA: Insufficient documentation

## 2020-01-09 LAB — CBC WITH DIFFERENTIAL (CANCER CENTER ONLY)
Abs Immature Granulocytes: 0.01 10*3/uL (ref 0.00–0.07)
Basophils Absolute: 0.1 10*3/uL (ref 0.0–0.1)
Basophils Relative: 1 %
Eosinophils Absolute: 0.5 10*3/uL (ref 0.0–0.5)
Eosinophils Relative: 12 %
HCT: 37.5 % — ABNORMAL LOW (ref 39.0–52.0)
Hemoglobin: 12.4 g/dL — ABNORMAL LOW (ref 13.0–17.0)
Immature Granulocytes: 0 %
Lymphocytes Relative: 27 %
Lymphs Abs: 1.3 10*3/uL (ref 0.7–4.0)
MCH: 29.6 pg (ref 26.0–34.0)
MCHC: 33.1 g/dL (ref 30.0–36.0)
MCV: 89.5 fL (ref 80.0–100.0)
Monocytes Absolute: 0.5 10*3/uL (ref 0.1–1.0)
Monocytes Relative: 11 %
Neutro Abs: 2.3 10*3/uL (ref 1.7–7.7)
Neutrophils Relative %: 49 %
Platelet Count: 261 10*3/uL (ref 150–400)
RBC: 4.19 MIL/uL — ABNORMAL LOW (ref 4.22–5.81)
RDW: 15.9 % — ABNORMAL HIGH (ref 11.5–15.5)
WBC Count: 4.7 10*3/uL (ref 4.0–10.5)
nRBC: 0 % (ref 0.0–0.2)

## 2020-01-09 LAB — CMP (CANCER CENTER ONLY)
ALT: 23 U/L (ref 0–44)
AST: 24 U/L (ref 15–41)
Albumin: 3.7 g/dL (ref 3.5–5.0)
Alkaline Phosphatase: 61 U/L (ref 38–126)
Anion gap: 9 (ref 5–15)
BUN: 19 mg/dL (ref 8–23)
CO2: 24 mmol/L (ref 22–32)
Calcium: 8.9 mg/dL (ref 8.9–10.3)
Chloride: 103 mmol/L (ref 98–111)
Creatinine: 1.13 mg/dL (ref 0.61–1.24)
GFR, Est AFR Am: >60 mL/min (ref >60)
GFR, Estimated: >60 mL/min (ref >60)
Glucose, Bld: 238 mg/dL — ABNORMAL HIGH (ref 70–99)
Potassium: 4.2 mmol/L (ref 3.5–5.1)
Sodium: 136 mmol/L (ref 135–145)
Total Bilirubin: 0.6 mg/dL (ref 0.3–1.2)
Total Protein: 7.1 g/dL (ref 6.5–8.1)

## 2020-01-09 MED ORDER — HEPARIN SOD (PORK) LOCK FLUSH 100 UNIT/ML IV SOLN
500.0000 [IU] | Freq: Once | INTRAVENOUS | Status: AC
  Administered 2020-01-09: 500 [IU]
  Filled 2020-01-09: qty 5

## 2020-01-09 MED ORDER — SODIUM CHLORIDE 0.9% FLUSH
10.0000 mL | Freq: Once | INTRAVENOUS | Status: AC
  Administered 2020-01-09: 10 mL
  Filled 2020-01-09: qty 10

## 2020-01-09 NOTE — Telephone Encounter (Signed)
Scheduled appt per 1/13 los.  Sent a message to HIM pool to get a calendar mailed out. 

## 2020-01-09 NOTE — Progress Notes (Signed)
Hematology and Oncology Follow Up Visit  Gregory Cummings [id number] 11/24/1946 74 y.o. 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  He was found to have 30% squamous cell differentiation and 10% glandular component.     Prior Therapy:  TURBT on April 12, 2019 which confirmed the diagnosis.  Neoadjuvant chemotherapy utilizing gemcitabine and cisplatin started on 05/29/2019.  He is status post 4 cycles of chemotherapy completed July 31, 2019.  He is status post radical cystectomy and lymph node dissection completed on October 10, 2019.  The final pathology showed complete response with T0N0 disease.  Current therapy: Active surveillance.  Interim History: Gregory Cummings is here for return evaluation.  Since the last visit, he underwent radical cystectomy and tolerated procedure very well.  The final pathology showed no residual tumor indicating incomplete pathological response.  He did require hospitalization on 2 separate occasions for urinary sepsis.  He has recovered well at this time and regained all activities of daily living.  He denies any nausea, vomiting or abdominal pain.  Denies fever chills.  Performance status and activity level remain excellent.                 Medications: Updated on review. Current Outpatient Medications  Medication Sig Dispense Refill  . acetaminophen (TYLENOL) 500 MG tablet Take 500 mg by mouth every 6 (six) hours as needed for mild pain, moderate pain or headache.    [name] aspirin 81 MG tablet Take 81 mg by mouth daily.    . carvedilol (COREG) 3.125 MG tablet Take 3.125 mg by mouth daily.    . famotidine (PEPCID) 10 MG tablet Take 10 mg by mouth 2 (two) times daily as needed for heartburn or indigestion.     . insulin glargine (LANTUS) 100 UNIT/ML injection Inject 0.2 mLs (20 Units total) into the skin daily. 10 mL 11   . insulin regular (NOVOLIN R) 100 units/mL injection Inject 30-100 Units into the skin 3 (three) times daily before meals. Sliding Scale Depended on the glucose reading and meal plan for the next two hours The patient has a Libre device on his arm    . levofloxacin (LEVAQUIN) 750 MG tablet Take 1 tablet (750 mg total) by mouth daily. 7 tablet 0  . lidocaine-prilocaine (EMLA) cream Apply 1 application topically as needed. 30 g 0  . lisinopril (ZESTRIL) 5 MG tablet Take 1 tablet (5 mg total) by mouth daily. For future refills, please discuss with your PCP. 30 tablet 0  . ondansetron (ZOFRAN) 8 MG tablet Take 8 mg by mouth 2 (two) times daily as needed for nausea or vomiting.     . simvastatin (ZOCOR) 20 MG tablet Take 20 mg by mouth at bedtime.      No current facility-administered medications for this visit.     Allergies:  Allergies  Allergen Reactions  . No Known Allergies     Past Medical History, Surgical history, Social history, and Family History updated without any changes. .  Physical Exam: Blood pressure (!) 147/83, pulse 81, temperature 98 F (36.7 C), temperature source Temporal, resp. rate 17, height 5\' 9"  (1.753 m), weight 214 lb 14.4 oz (97.5 kg), SpO2 100 %.   ECOG: 0   General appearance: Comfortable appearing without any discomfort Head: Normocephalic without any trauma Oropharynx: Mucous membranes are moist and pink without any thrush or ulcers. Eyes: Pupils are equal and  round reactive to light. Lymph nodes: No cervical, supraclavicular, inguinal or axillary lymphadenopathy.   Heart:regular rate and rhythm.  S1 and S2 without leg edema. Lung: Clear without any rhonchi or wheezes.  No dullness to percussion. Abdomin: Soft, nontender, nondistended with good bowel sounds.  No hepatosplenomegaly. Musculoskeletal: No joint deformity or effusion.  Full range of motion noted. Neurological: No deficits noted on motor, sensory and deep tendon reflex exam. Skin: No  petechial rash or dryness.  Appeared moist.         Lab Results: Lab Results  Component Value Date   WBC 4.7 01/09/2020   HGB 12.4 (L) 01/09/2020   HCT 37.5 (L) 01/09/2020   MCV 89.5 01/09/2020   PLT 261 01/09/2020     Chemistry      Component Value Date/Time   NA 135 11/20/2019 0457   K 3.7 11/20/2019 0457   CL 103 11/20/2019 0457   CO2 23 11/20/2019 0457   BUN 20 11/20/2019 0457   CREATININE 0.98 11/20/2019 0457   CREATININE 1.18 08/28/2019 0901      Component Value Date/Time   CALCIUM 8.3 (L) 11/20/2019 0457   ALKPHOS 44 11/19/2019 0659   AST 14 (L) 11/19/2019 0659   AST 22 08/28/2019 0901   ALT 14 11/19/2019 0659   ALT 18 08/28/2019 0901   BILITOT 1.1 11/19/2019 0659   BILITOT 0.5 08/28/2019 0901          Impression and Plan:  74 year old man with:  1. T2N0 high-grade urothelial carcinoma with squamous and glandular differentiation of the bladder diagnosed in April 2020.  He is status post neoadjuvant chemotherapy followed by radical cystectomy which showed a complete pathological response.  The natural course of this disease was reviewed today in detail.  His pathology results were also discussed as well as imaging studies post surgery.  He has no evidence of disease at this time and risk of relapse was assessed.  Given the fact that he had a pathological response is good prognostic sign but certainly remains at high risk given his squamous differentiation.  We will continue an active surveillance at this time and repeat imaging studies in 6 months.  This will be arranged management.  2. IV access:Port-A-Cath remains in place and will be flushed periodically.  Risks and benefits of keeping this port was discussed today.  He prefers to keep it for the time being.   3.  Urinary sepsis: Resolved at this time..  4. Renal function surveillance: His creatinine is back to baseline and will be repeated today.  5. Follow-up: He will return in 6 months  for repeat evaluation..   30  minutes was spent on this encounter today.  The time was dedicated to reviewing pathology reports, reviewing imaging studies, discussing the natural course of his disease, risk of relapse and management options for the future.   Zola Button, MD 1/13/20219:07 AM

## 2020-03-07 ENCOUNTER — Inpatient Hospital Stay: Payer: Medicare Other | Attending: Oncology

## 2020-03-07 ENCOUNTER — Other Ambulatory Visit: Payer: Self-pay

## 2020-03-07 DIAGNOSIS — Z95828 Presence of other vascular implants and grafts: Secondary | ICD-10-CM

## 2020-03-07 DIAGNOSIS — Z8551 Personal history of malignant neoplasm of bladder: Secondary | ICD-10-CM | POA: Insufficient documentation

## 2020-03-07 DIAGNOSIS — C670 Malignant neoplasm of trigone of bladder: Secondary | ICD-10-CM

## 2020-03-07 DIAGNOSIS — Z452 Encounter for adjustment and management of vascular access device: Secondary | ICD-10-CM | POA: Insufficient documentation

## 2020-03-07 MED ORDER — SODIUM CHLORIDE 0.9% FLUSH
10.0000 mL | Freq: Once | INTRAVENOUS | Status: AC
  Administered 2020-03-07: 10 mL
  Filled 2020-03-07: qty 10

## 2020-03-07 MED ORDER — HEPARIN SOD (PORK) LOCK FLUSH 100 UNIT/ML IV SOLN
500.0000 [IU] | Freq: Once | INTRAVENOUS | Status: AC
  Administered 2020-03-07: 500 [IU]
  Filled 2020-03-07: qty 5

## 2020-05-01 ENCOUNTER — Other Ambulatory Visit (HOSPITAL_COMMUNITY): Payer: Self-pay | Admitting: Urology

## 2020-05-01 ENCOUNTER — Ambulatory Visit (HOSPITAL_COMMUNITY)
Admission: RE | Admit: 2020-05-01 | Discharge: 2020-05-01 | Disposition: A | Discharge status: Home / Self Care | Payer: Medicare Other | Source: Ambulatory Visit | Attending: Urology | Admitting: Urology

## 2020-05-01 ENCOUNTER — Other Ambulatory Visit: Payer: Self-pay

## 2020-05-01 DIAGNOSIS — C672 Malignant neoplasm of lateral wall of bladder: Secondary | ICD-10-CM

## 2020-05-02 ENCOUNTER — Inpatient Hospital Stay: Payer: Medicare Other | Attending: Oncology

## 2020-05-02 ENCOUNTER — Other Ambulatory Visit: Payer: Self-pay

## 2020-05-02 DIAGNOSIS — Z8551 Personal history of malignant neoplasm of bladder: Secondary | ICD-10-CM | POA: Insufficient documentation

## 2020-05-02 DIAGNOSIS — Z95828 Presence of other vascular implants and grafts: Secondary | ICD-10-CM

## 2020-05-02 DIAGNOSIS — Z452 Encounter for adjustment and management of vascular access device: Secondary | ICD-10-CM | POA: Diagnosis not present

## 2020-05-02 DIAGNOSIS — C670 Malignant neoplasm of trigone of bladder: Secondary | ICD-10-CM

## 2020-05-02 MED ORDER — HEPARIN SOD (PORK) LOCK FLUSH 100 UNIT/ML IV SOLN
500.0000 [IU] | Freq: Once | INTRAVENOUS | Status: AC
  Administered 2020-05-02: 500 [IU]
  Filled 2020-05-02: qty 5

## 2020-05-02 MED ORDER — SODIUM CHLORIDE 0.9% FLUSH
10.0000 mL | Freq: Once | INTRAVENOUS | Status: AC
  Administered 2020-05-02: 10 mL
  Filled 2020-05-02: qty 10

## 2020-06-23 IMAGING — DX DG CHEST 1V PORT
1 series · 1 of 1 positions shown · non-contrast
Comparison: 06/30/2015

CLINICAL DATA: Fever.  Fatigue.  Bladder carcinoma.

EXAM:
PORTABLE CHEST 1 VIEW

[chest ap]
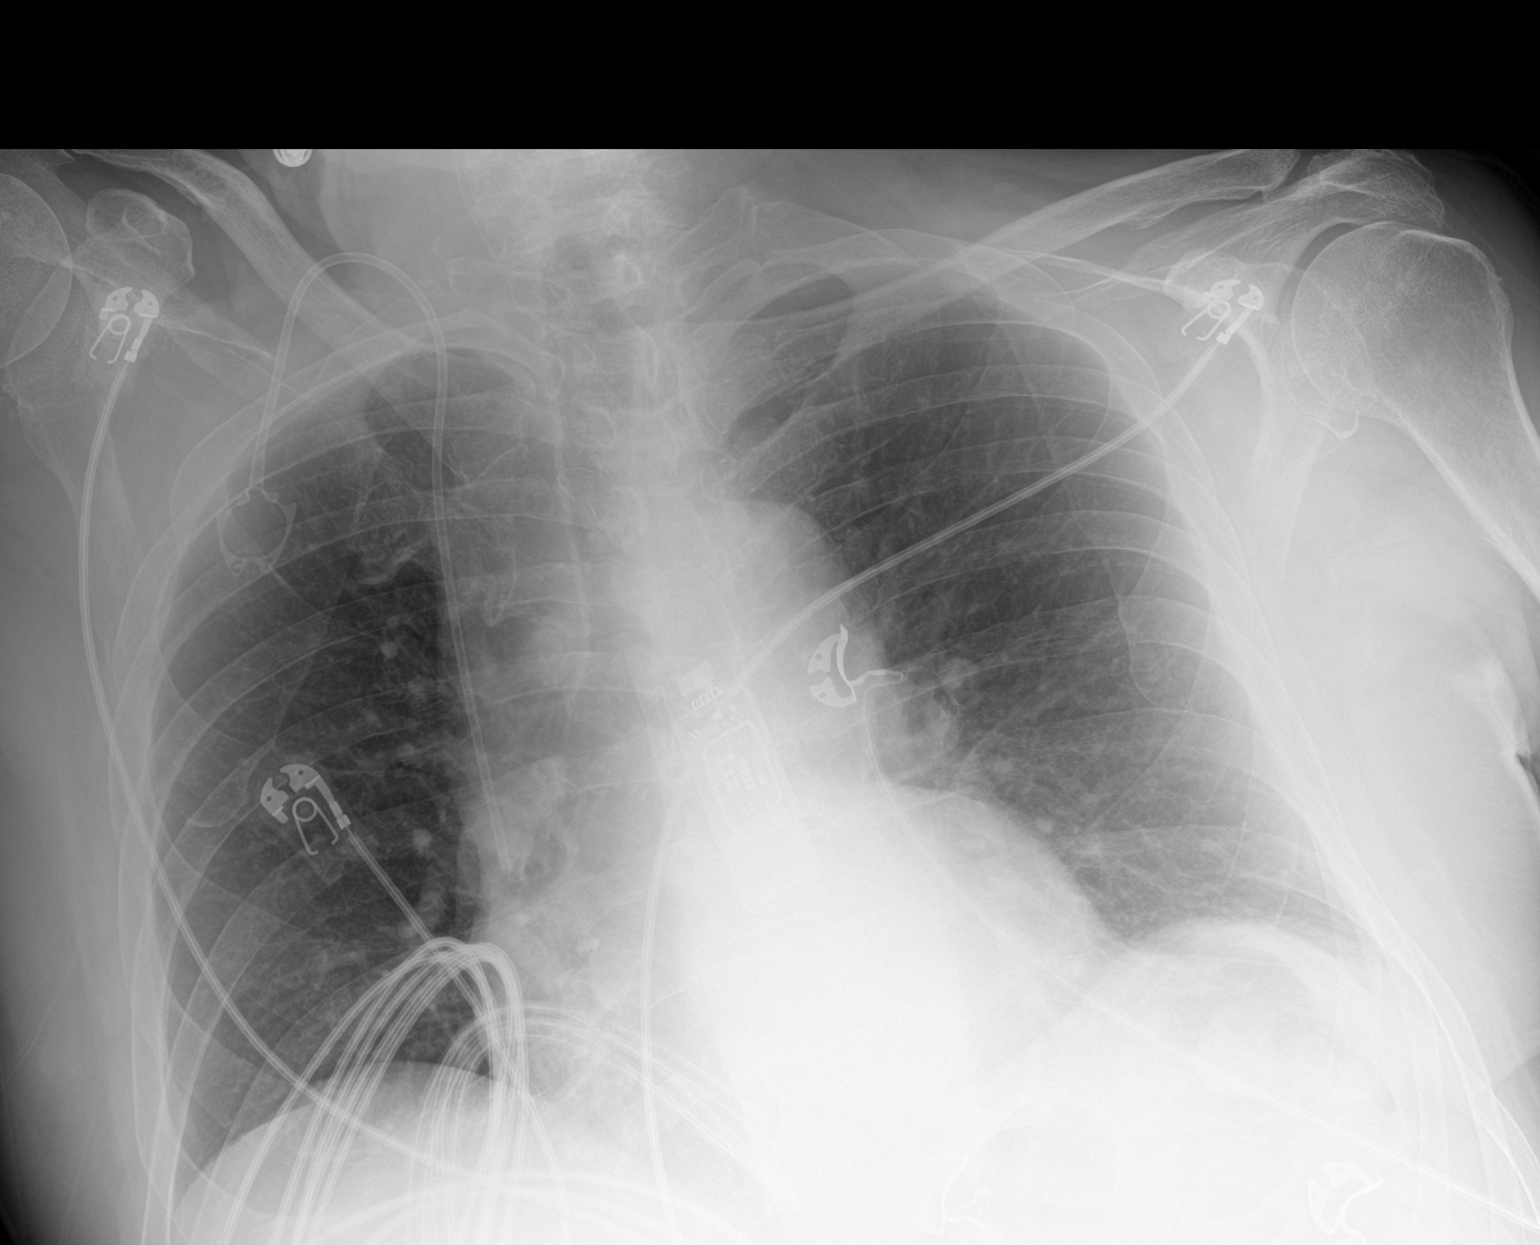

[1 of 1 positions shown; findings below may reference images not displayed]

FINDINGS: Heart size is normal. Right-sided Port-A-Cath is seen in appropriate
position. Both lungs are clear.
IMPRESSION: No active disease.

## 2020-06-24 IMAGING — CT CT ABD-PELV W/ CM
2 of 5 series · 15 of 46 positions shown, 17 images · IV contrast (APPLIED)
Comparison: 08/28/2019

CLINICAL DATA: Diarrhea with fever of unknown origin. History of
urothelial carcinoma status post laparoscopic Cysto prostatectomy
with ileal conduit diversion.

EXAM:
CT ABDOMEN AND PELVIS WITH CONTRAST
TECHNIQUE: Multidetector CT imaging of the abdomen and pelvis was performed
using the standard protocol following bolus administration of
intravenous contrast.
CONTRAST:  100mL OMNIPAQUE IOHEXOL 300 MG/ML SOLN, 30mL OMNIPAQUE
IOHEXOL 300 MG/ML SOLN

[Series 2: axial st · axial · 0.86mm/px · z∈[-603,-178]mm · 12 of 99 slices shown, 14 images]
[im 7/99  soft-tissue]
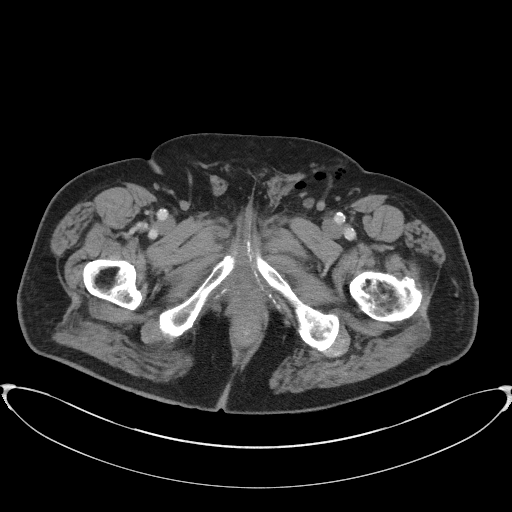
[im 7/99  bone]
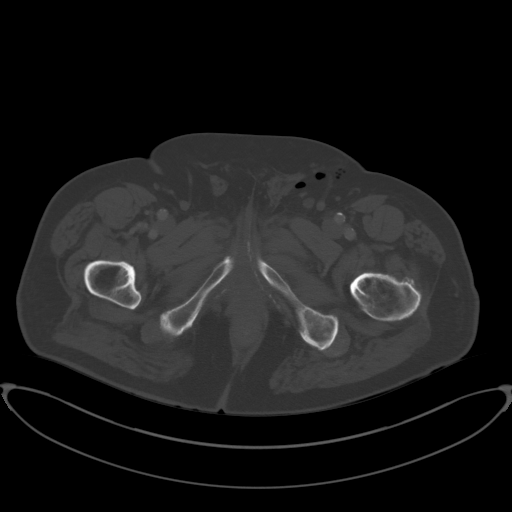
[im 14/99  soft-tissue]
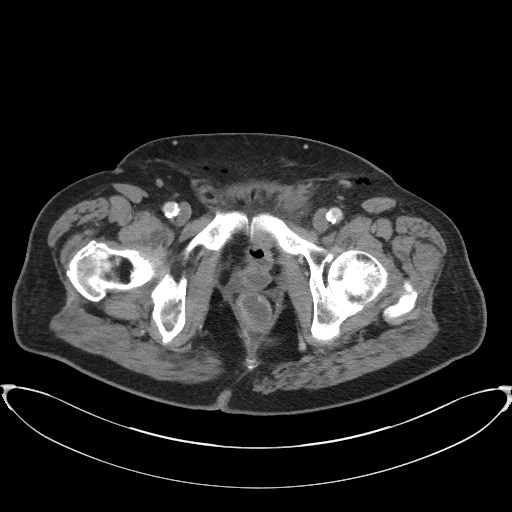
[im 20/99  soft-tissue]
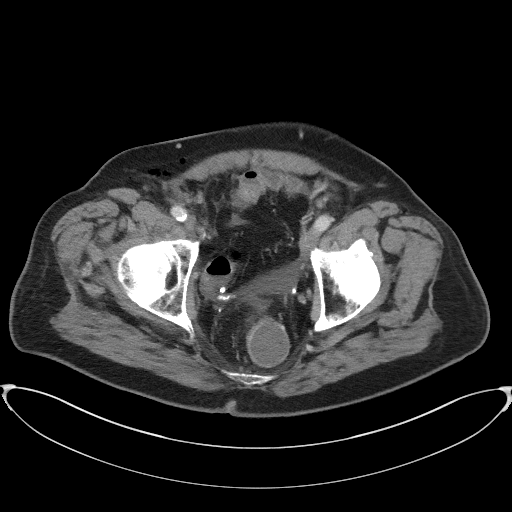
[im 33/99  soft-tissue]
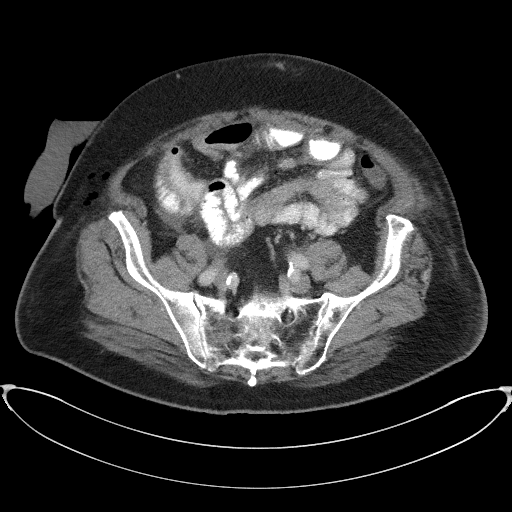
[im 40/99  soft-tissue]
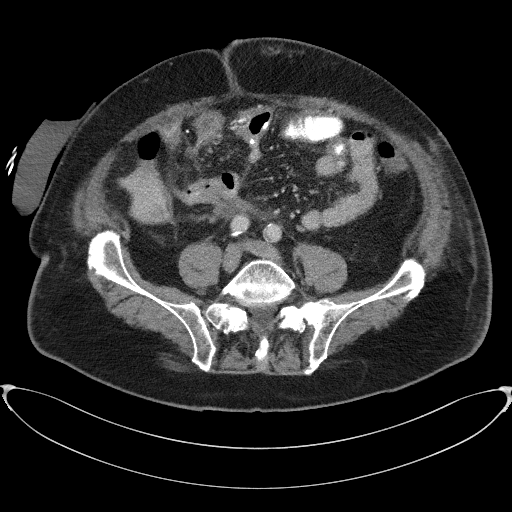
[im 46/99  soft-tissue]
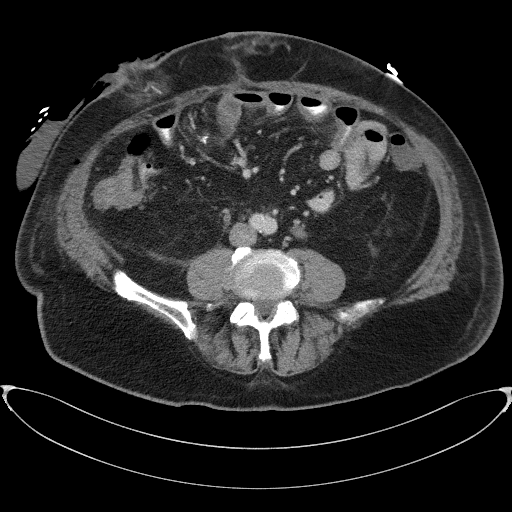
[im 53/99  soft-tissue]
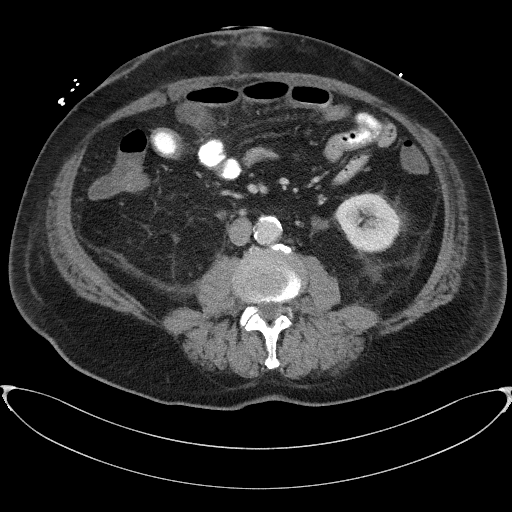
[im 59/99  soft-tissue]
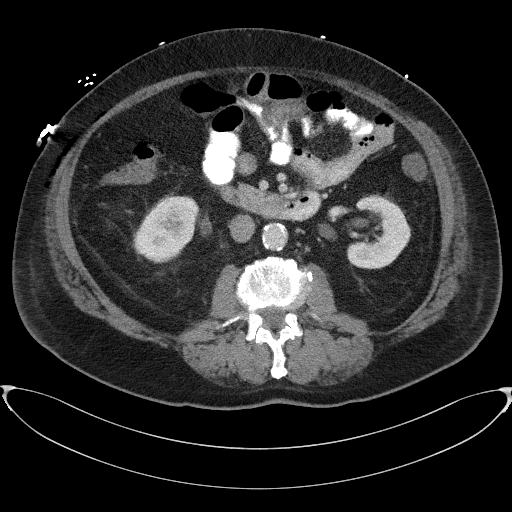
[im 66/99  soft-tissue]
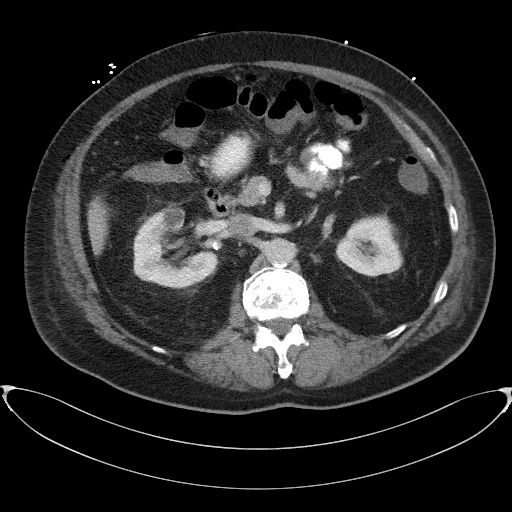
[im 66/99  bone]
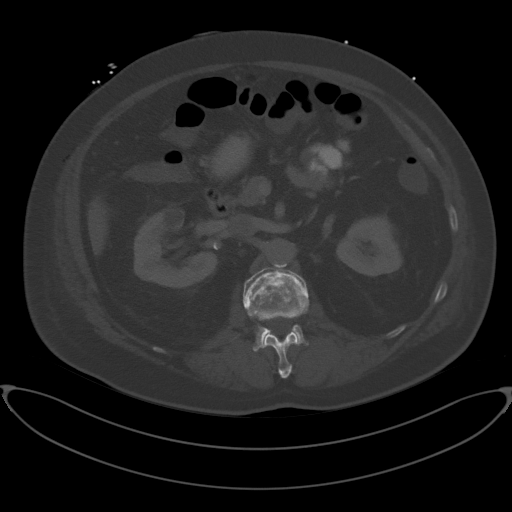
[im 79/99  soft-tissue]
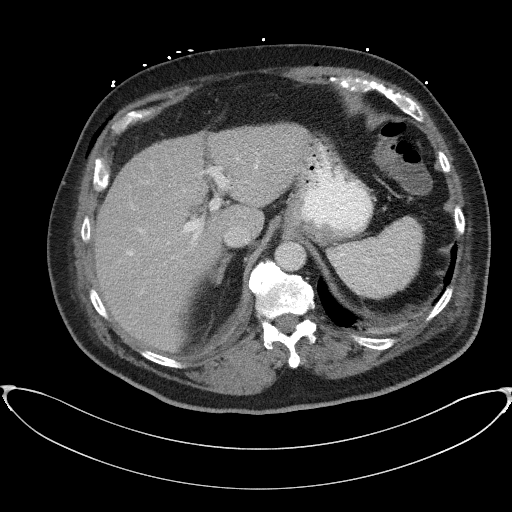
[im 85/99  soft-tissue]
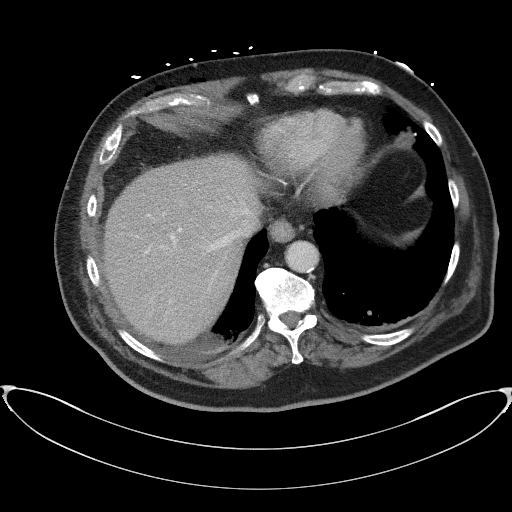
[im 92/99  soft-tissue]
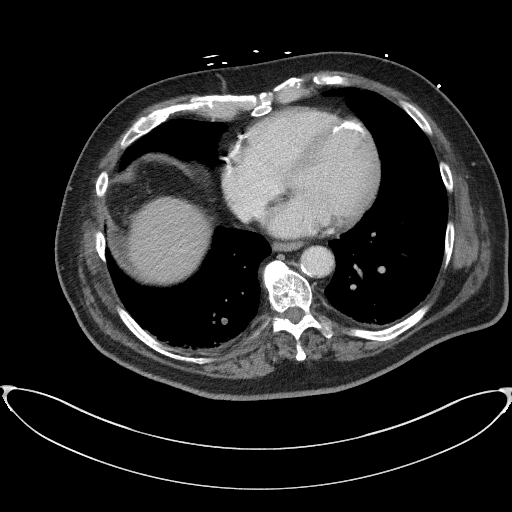

[Series 5: coronal st · coronal · 0.85mm/px · 3 of 110 slices shown]
[im 37/110  soft-tissue]
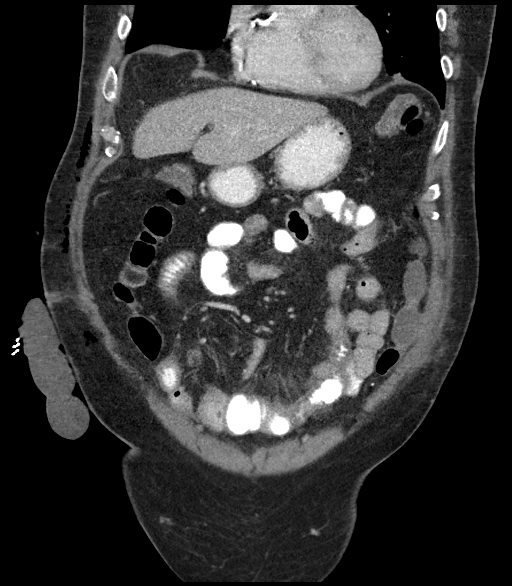
[im 49/110  soft-tissue]
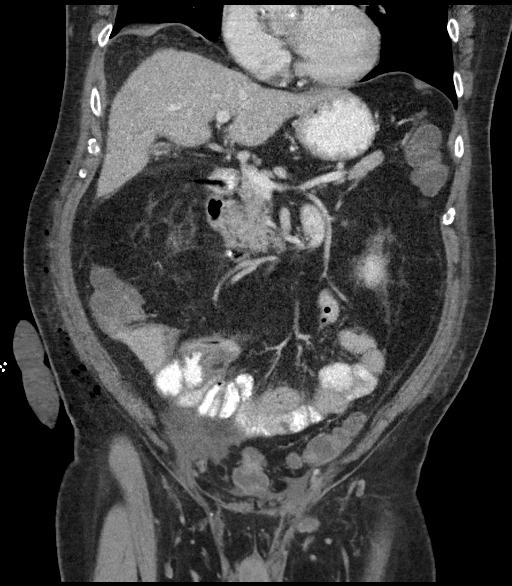
[im 61/110  soft-tissue]
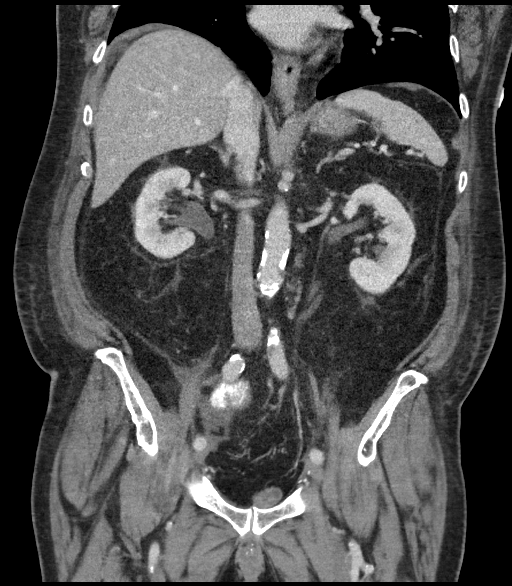

[15 of 46 positions shown; findings below may reference images not displayed]

FINDINGS: Lower chest: Dependent changes are identified within both lung
bases. Trace pleural effusions noted right greater than left.

Hepatobiliary: No focal liver abnormality identified. Gallbladder is
collapsed.

Pancreas: Unremarkable. No pancreatic ductal dilatation or
surrounding inflammatory changes.

Spleen: Normal in size without focal abnormality.

Adrenals/Urinary Tract: Normal appearance of the adrenal glands.
Right kidney cysts measures 1.5 cm, image 33/2. Mild bilateral
hydronephrosis identified. Duplicated left renal collecting system.
Status post Cysto prostatectomy with right abdominal diverting
urostomy.

Stomach/Bowel: Stomach normal. No dilated loops of small or large
bowel identified. Within the right lower quadrant of the abdomen in
the region of the loop ileostomy there are several loops of small
bowel which exhibit mild wall thickening/inflammation. Dilute
contrast is identified within the cecum compatible with small bowel
patency. No abnormal large bowel wall thickening, inflammation or
distension.

Vascular/Lymphatic: Aortic atherosclerosis without aneurysm. No
abdominal adenopathy. No iliac adenopathy. Prominent left inguinal
lymph node measures 9 mm short axis.

Reproductive: Status post prostatectomy.

Other: There is free fluid identified within the abdomen and pelvis.
No discrete fluid collection identified. Free fluid extends over the
scratch set a small amount of perihepatic free fluid is noted
between the dome and right hemidiaphragm. Subcutaneous gas is
identified within bilateral inguinal regions, image 91/2 and image
88/2. There is also subcutaneous gas within the abdominal wall
bilaterally.

Musculoskeletal: No acute or significant osseous findings.
IMPRESSION: 1. Status post Cysto prostatectomy with right abdominal diverting
loop ileostomy.
2. There are several loops of small bowel within the right lower
quadrant of the abdomen in the region of the loop ileostomy which
exhibits mild wall thickening/inflammation. Findings may reflect an
inflammatory or infectious enteritis. No evidence for bowel
obstruction.
3. There is a small amount of free fluid identified within the
abdomen and pelvis. No discrete fluid collection identified.
4. Mild bilateral hydronephrosis. No striated nephrograms
identified. No renal abscess noted.
5. Subcutaneous gas within the abdominal wall in bilateral inguinal
regions may be related to recent surgery.
6. Aortic atherosclerosis.

Aortic Atherosclerosis ([id number]).

## 2020-07-07 ENCOUNTER — Other Ambulatory Visit: Payer: Self-pay | Admitting: Oncology

## 2020-07-07 DIAGNOSIS — C670 Malignant neoplasm of trigone of bladder: Secondary | ICD-10-CM

## 2020-07-08 ENCOUNTER — Inpatient Hospital Stay: Payer: Medicare Other

## 2020-07-08 ENCOUNTER — Inpatient Hospital Stay: Payer: Medicare Other | Attending: Oncology | Admitting: Oncology

## 2020-07-08 ENCOUNTER — Other Ambulatory Visit: Payer: Self-pay

## 2020-07-08 VITALS — BP 135/82 | HR 62 | Temp 97.7°F | Resp 17 | Ht 69.0 in | Wt 224.5 lb

## 2020-07-08 DIAGNOSIS — Z452 Encounter for adjustment and management of vascular access device: Secondary | ICD-10-CM | POA: Diagnosis present

## 2020-07-08 DIAGNOSIS — C670 Malignant neoplasm of trigone of bladder: Secondary | ICD-10-CM

## 2020-07-08 DIAGNOSIS — C679 Malignant neoplasm of bladder, unspecified: Secondary | ICD-10-CM | POA: Insufficient documentation

## 2020-07-08 DIAGNOSIS — I2510 Atherosclerotic heart disease of native coronary artery without angina pectoris: Secondary | ICD-10-CM | POA: Diagnosis not present

## 2020-07-08 DIAGNOSIS — I700 Atherosclerosis of aorta: Secondary | ICD-10-CM | POA: Diagnosis not present

## 2020-07-08 DIAGNOSIS — Z79899 Other long term (current) drug therapy: Secondary | ICD-10-CM | POA: Diagnosis not present

## 2020-07-08 DIAGNOSIS — Q638 Other specified congenital malformations of kidney: Secondary | ICD-10-CM | POA: Diagnosis not present

## 2020-07-08 DIAGNOSIS — Z95828 Presence of other vascular implants and grafts: Secondary | ICD-10-CM

## 2020-07-08 DIAGNOSIS — Z8551 Personal history of malignant neoplasm of bladder: Secondary | ICD-10-CM | POA: Diagnosis present

## 2020-07-08 LAB — CMP (CANCER CENTER ONLY)
ALT: 28 U/L (ref 0–44)
AST: 25 U/L (ref 15–41)
Albumin: 3.7 g/dL (ref 3.5–5.0)
Alkaline Phosphatase: 69 U/L (ref 38–126)
Anion gap: 10 (ref 5–15)
BUN: 19 mg/dL (ref 8–23)
CO2: 24 mmol/L (ref 22–32)
Calcium: 9.6 mg/dL (ref 8.9–10.3)
Chloride: 102 mmol/L (ref 98–111)
Creatinine: 1.09 mg/dL (ref 0.61–1.24)
GFR, Est AFR Am: >60 mL/min (ref >60)
GFR, Estimated: >60 mL/min (ref >60)
Glucose, Bld: 231 mg/dL — ABNORMAL HIGH (ref 70–99)
Potassium: 4.1 mmol/L (ref 3.5–5.1)
Sodium: 136 mmol/L (ref 135–145)
Total Bilirubin: 0.9 mg/dL (ref 0.3–1.2)
Total Protein: 7.4 g/dL (ref 6.5–8.1)

## 2020-07-08 LAB — CBC WITH DIFFERENTIAL (CANCER CENTER ONLY)
Abs Immature Granulocytes: 0.01 10*3/uL (ref 0.00–0.07)
Basophils Absolute: 0 10*3/uL (ref 0.0–0.1)
Basophils Relative: 1 %
Eosinophils Absolute: 0.2 10*3/uL (ref 0.0–0.5)
Eosinophils Relative: 5 %
HCT: 41.7 % (ref 39.0–52.0)
Hemoglobin: 14.2 g/dL (ref 13.0–17.0)
Immature Granulocytes: 0 %
Lymphocytes Relative: 32 %
Lymphs Abs: 1.2 10*3/uL (ref 0.7–4.0)
MCH: 30.9 pg (ref 26.0–34.0)
MCHC: 34.1 g/dL (ref 30.0–36.0)
MCV: 90.7 fL (ref 80.0–100.0)
Monocytes Absolute: 0.4 10*3/uL (ref 0.1–1.0)
Monocytes Relative: 11 %
Neutro Abs: 1.9 10*3/uL (ref 1.7–7.7)
Neutrophils Relative %: 51 %
Platelet Count: 195 10*3/uL (ref 150–400)
RBC: 4.6 MIL/uL (ref 4.22–5.81)
RDW: 13 % (ref 11.5–15.5)
WBC Count: 3.8 10*3/uL — ABNORMAL LOW (ref 4.0–10.5)
nRBC: 0 % (ref 0.0–0.2)

## 2020-07-08 MED ORDER — SODIUM CHLORIDE 0.9% FLUSH
10.0000 mL | Freq: Once | INTRAVENOUS | Status: AC
  Administered 2020-07-08: 10 mL
  Filled 2020-07-08: qty 10

## 2020-07-08 MED ORDER — HEPARIN SOD (PORK) LOCK FLUSH 100 UNIT/ML IV SOLN
500.0000 [IU] | Freq: Once | INTRAVENOUS | Status: AC
  Administered 2020-07-08: 500 [IU]
  Filled 2020-07-08: qty 5

## 2020-07-08 NOTE — Progress Notes (Signed)
Hematology and Oncology Follow Up Visit  Gregory Cummings [id number] July 16, 1946 74 y.o. 07/08/2020 9:44 AM Gregory Cummings, MDStoneking, Christiane Ha, MD   Principle Diagnosis: 74 year old man with bladder cancer diagnosed in April 2020.  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     Prior Therapy:  TURBT on April 12, 2019 which confirmed the diagnosis.  Neoadjuvant chemotherapy utilizing gemcitabine and cisplatin started on 05/29/2019.  He is status post 4 cycles of chemotherapy completed July 31, 2019.  He is status post radical cystectomy and lymph node dissection completed on October 10, 2019.  The final pathology showed complete response with T0N0 disease.  Current therapy: Active surveillance.  Interim History: Gregory Cummings presents today for return visit.  Since last visit, he reports no major changes in his health.  The denied any recent hospitalization or illnesses.  She denies any abdominal pain or discomfort.  He denies any hematuria or dysuria.  He denies any residual complications related to his chemotherapy and surgery.  He has full status and quality of life remain excellent.                 Medications: Unchanged on review. Current Outpatient Medications  Medication Sig Dispense Refill  . acetaminophen (TYLENOL) 500 MG tablet Take 500 mg by mouth every 6 (six) hours as needed for mild pain, moderate pain or headache.    [name] aspirin 81 MG tablet Take 81 mg by mouth daily.    . carvedilol (COREG) 3.125 MG tablet Take 3.125 mg by mouth daily.    . famotidine (PEPCID) 10 MG tablet Take 10 mg by mouth 2 (two) times daily as needed for heartburn or indigestion.     . insulin glargine (LANTUS) 100 UNIT/ML injection Inject 0.2 mLs (20 Units total) into the skin daily. 10 mL 11  . insulin regular (NOVOLIN R) 100 units/mL injection Inject 30-100 Units into the skin 3 (three) times daily before meals.  Sliding Scale Depended on the glucose reading and meal plan for the next two hours The patient has a Libre device on his arm    . levofloxacin (LEVAQUIN) 750 MG tablet Take 1 tablet (750 mg total) by mouth daily. 7 tablet 0  . lidocaine-prilocaine (EMLA) cream Apply 1 application topically as needed. 30 g 0  . lisinopril (ZESTRIL) 5 MG tablet Take 1 tablet (5 mg total) by mouth daily. For future refills, please discuss with your PCP. 30 tablet 0  . ondansetron (ZOFRAN) 8 MG tablet Take 8 mg by mouth 2 (two) times daily as needed for nausea or vomiting.     . simvastatin (ZOCOR) 20 MG tablet Take 20 mg by mouth at bedtime.      No current facility-administered medications for this visit.     Allergies:  Allergies  Allergen Reactions  . No Known Allergies       Physical Exam:   Blood pressure 135/82, pulse 62, temperature 97.7 F (36.5 C), temperature source Temporal, resp. rate 17, height 5\' 9"  (1.753 m), weight 224 lb 8 oz (101.8 kg).    ECOG: 0     General appearance: Alert, awake without any distress. Head: Atraumatic without abnormalities Oropharynx: Without any thrush or ulcers. Eyes: No scleral icterus. Lymph nodes: No lymphadenopathy noted in the cervical, supraclavicular, or axillary nodes Heart:regular rate and rhythm, without any murmurs or gallops.   Lung: Clear to auscultation without any rhonchi, wheezes or dullness to percussion. Abdomin: Soft,  nontender without any shifting dullness or ascites. Musculoskeletal: No clubbing or cyanosis. Neurological: No motor or sensory deficits. Skin: No rashes or lesions.         Lab Results: Lab Results  Component Value Date   WBC 4.7 01/09/2020   HGB 12.4 (L) 01/09/2020   HCT 37.5 (L) 01/09/2020   MCV 89.5 01/09/2020   PLT 261 01/09/2020     Chemistry      Component Value Date/Time   NA 136 01/09/2020 0834   K 4.2 01/09/2020 0834   CL 103 01/09/2020 0834   CO2 24 01/09/2020 0834   BUN 19 01/09/2020  0834   CREATININE 1.13 01/09/2020 0834      Component Value Date/Time   CALCIUM 8.9 01/09/2020 0834   ALKPHOS 61 01/09/2020 0834   AST 24 01/09/2020 0834   ALT 23 01/09/2020 0834   BILITOT 0.6 01/09/2020 0834       IMPRESSION: 1. No findings of recurrent malignancy. 2. Cystoprostatectomy with right ileal loop diversion, no complicating feature. No current hydronephrosis. 3. Other imaging findings of potential clinical significance: Coronary atherosclerosis. Chronic calcification along the cardiac apex likely from prior myocardial infarction. Duplicated left renal collecting system. Remote compression fracture at L2. 4. Aortic atherosclerosis.  Aortic Atherosclerosis (ICD10-I70.0).   Impression and Plan:  74 year old man with:  1.  Bladder cancer diagnosed in April 2020.  He was found to have T2N0 high-grade urothelial carcinoma with squamous and glandular differentiation.  He achieved a complete response with neoadjuvant chemotherapy followed by cystectomy.  He is currently on active surveillance without any evidence of recurrent disease.  Imaging studies in May 2021 including CT scan and chest x-ray were reviewed and showed no evidence of malignancy.  Risk of relapse remains low given his complete response at this time.  I recommended continued active surveillance.  No additional systemic therapy is warranted at this time unless he developed relapsed disease.  He is agreeable with this plan.  We will continue to have imaging studies done under the care of Dr. Tresa Moore.  2. IV access:Risks and benefits of Port-A-Cath removal were discussed.  The need for continuous flushing was discussed today.  He prefers to keep it for the time being which would be reasonable at this time.   3.  Renal function surveillance: No delayed renal insufficiency related to chemotherapy.  4. Follow-up: Every 2 months for Port-A-Cath flush and MD follow-up in 6 months.  30  minutes were dedicated  to this encounter.  The time was spent on reviewing imaging studies, discussing with disease status update treatment options for the future and future plan of care review.   Gregory Button, MD 7/13/20219:44 AM

## 2020-07-10 ENCOUNTER — Telehealth: Payer: Self-pay | Admitting: Oncology

## 2020-07-10 NOTE — Telephone Encounter (Signed)
Scheduled per 07/13 los, spoke with patient's health care worker and patient will be notified of upcoming appointments.

## 2020-07-14 IMAGING — CR DG CHEST 2V
2 series · 2 of 2 positions shown · non-contrast
Comparison: October 28, 2019

CLINICAL DATA: Sepsis.

EXAM:
CHEST - 2 VIEW

[w chest lat]
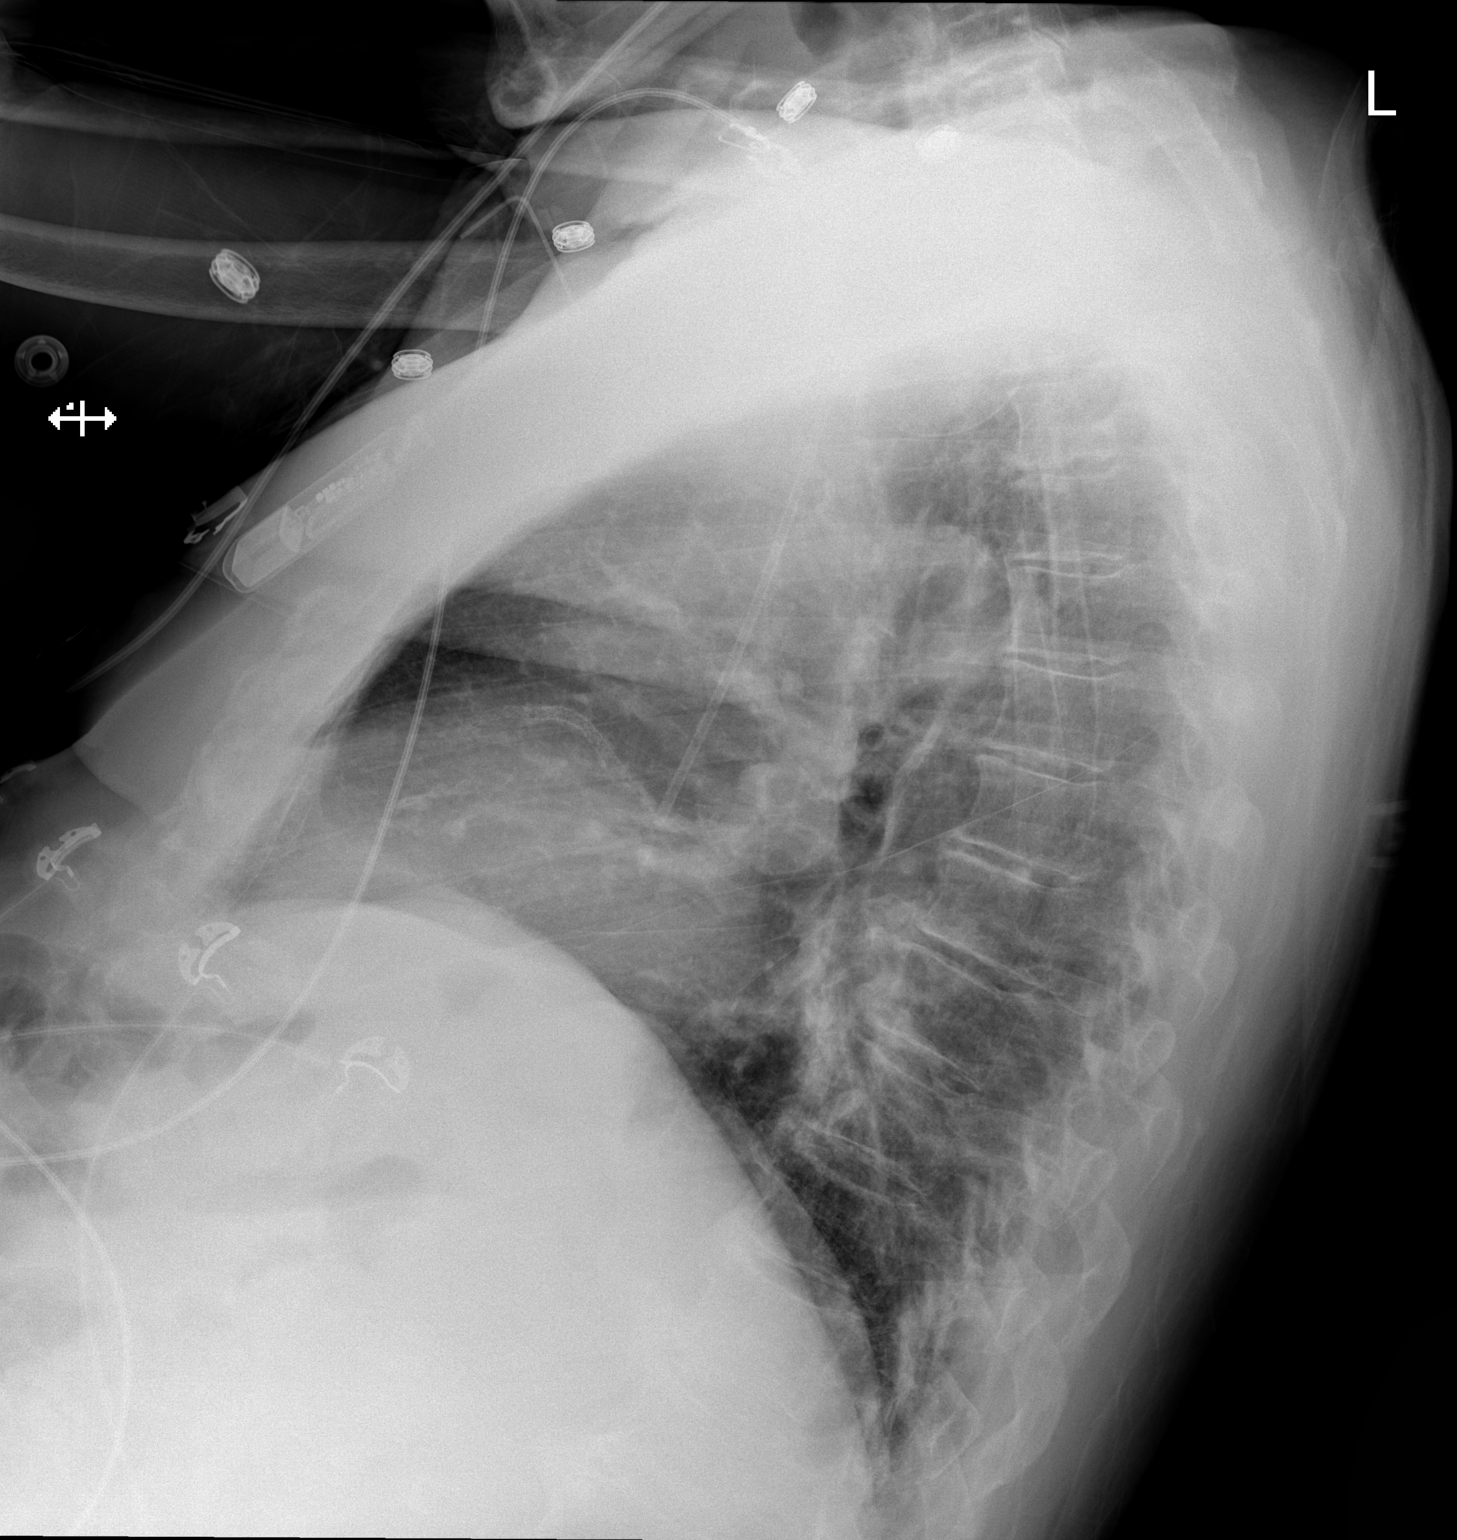

[x chest ap]
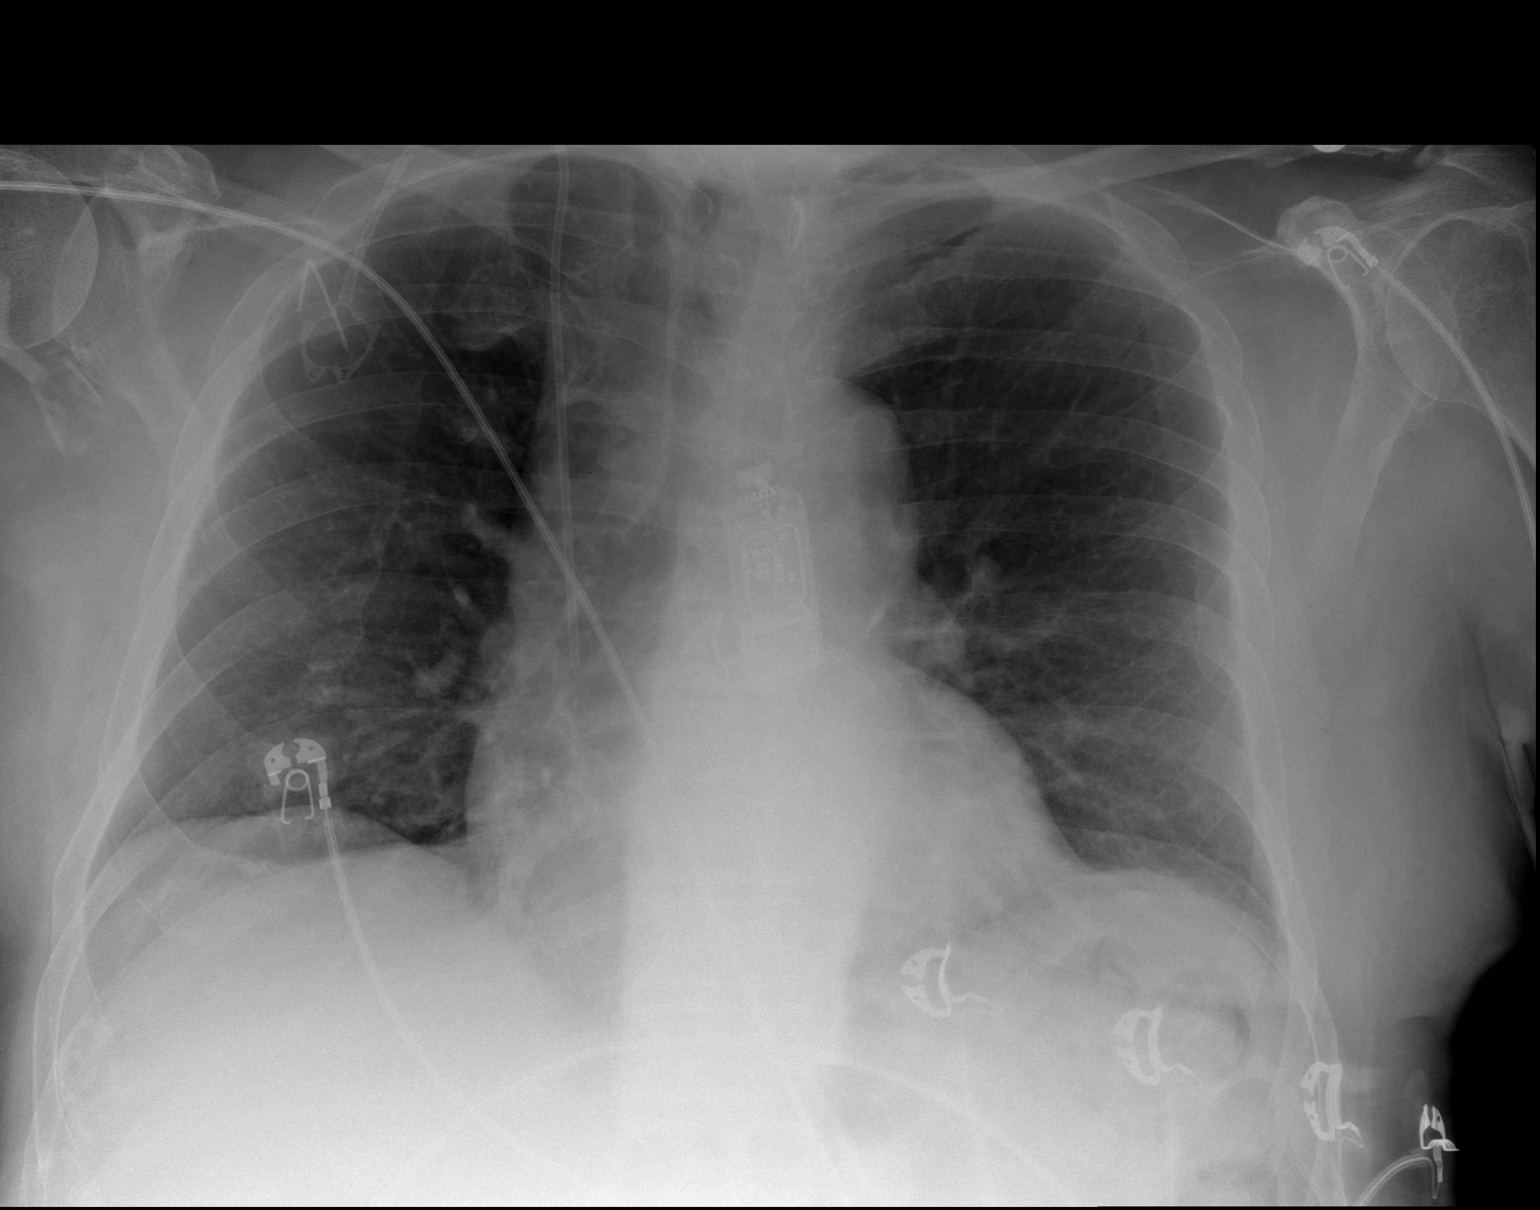

[2 of 2 positions shown; findings below may reference images not displayed]

FINDINGS: There is a well-positioned right-sided Port-A-Cath. The heart size
is unchanged from prior study. There is atelectasis at the lung
bases. There is no pneumothorax. No large pleural effusion. There is
no acute osseous abnormality. A coronary stent is noted.
IMPRESSION: No active cardiopulmonary disease.

## 2020-08-23 ENCOUNTER — Ambulatory Visit: Payer: Self-pay

## 2020-09-09 ENCOUNTER — Inpatient Hospital Stay: Payer: Medicare Other | Attending: Oncology

## 2020-09-09 ENCOUNTER — Other Ambulatory Visit: Payer: Self-pay

## 2020-09-09 DIAGNOSIS — Z452 Encounter for adjustment and management of vascular access device: Secondary | ICD-10-CM | POA: Diagnosis not present

## 2020-09-09 DIAGNOSIS — Z95828 Presence of other vascular implants and grafts: Secondary | ICD-10-CM

## 2020-09-09 DIAGNOSIS — Z8551 Personal history of malignant neoplasm of bladder: Secondary | ICD-10-CM | POA: Diagnosis present

## 2020-09-09 DIAGNOSIS — C670 Malignant neoplasm of trigone of bladder: Secondary | ICD-10-CM

## 2020-09-09 MED ORDER — SODIUM CHLORIDE 0.9% FLUSH
10.0000 mL | Freq: Once | INTRAVENOUS | Status: AC
  Administered 2020-09-09: 10 mL
  Filled 2020-09-09: qty 10

## 2020-09-09 MED ORDER — HEPARIN SOD (PORK) LOCK FLUSH 100 UNIT/ML IV SOLN
500.0000 [IU] | Freq: Once | INTRAVENOUS | Status: DC
  Filled 2020-09-09: qty 5

## 2020-09-09 MED ORDER — HEPARIN SOD (PORK) LOCK FLUSH 100 UNIT/ML IV SOLN
500.0000 [IU] | Freq: Once | INTRAVENOUS | Status: AC
  Administered 2020-09-09: 500 [IU]
  Filled 2020-09-09: qty 5

## 2020-09-09 MED ORDER — SODIUM CHLORIDE 0.9% FLUSH
10.0000 mL | INTRAVENOUS | Status: DC | PRN
  Filled 2020-09-09: qty 10

## 2020-09-09 NOTE — Patient Instructions (Signed)

## 2020-11-11 ENCOUNTER — Inpatient Hospital Stay: Payer: Medicare Other | Attending: Oncology

## 2020-11-11 ENCOUNTER — Other Ambulatory Visit: Payer: Self-pay

## 2020-11-11 DIAGNOSIS — Z95828 Presence of other vascular implants and grafts: Secondary | ICD-10-CM

## 2020-11-11 DIAGNOSIS — Z452 Encounter for adjustment and management of vascular access device: Secondary | ICD-10-CM | POA: Diagnosis not present

## 2020-11-11 DIAGNOSIS — Z8551 Personal history of malignant neoplasm of bladder: Secondary | ICD-10-CM | POA: Insufficient documentation

## 2020-11-11 DIAGNOSIS — C670 Malignant neoplasm of trigone of bladder: Secondary | ICD-10-CM

## 2020-11-11 MED ORDER — HEPARIN SOD (PORK) LOCK FLUSH 100 UNIT/ML IV SOLN
500.0000 [IU] | Freq: Once | INTRAVENOUS | Status: AC
  Administered 2020-11-11: 500 [IU]
  Filled 2020-11-11: qty 5

## 2020-11-11 MED ORDER — SODIUM CHLORIDE 0.9% FLUSH
10.0000 mL | Freq: Once | INTRAVENOUS | Status: AC
  Administered 2020-11-11: 10 mL
  Filled 2020-11-11: qty 10

## 2020-11-11 NOTE — Patient Instructions (Signed)

## 2021-01-01 DIAGNOSIS — Z936 Other artificial openings of urinary tract status: Secondary | ICD-10-CM | POA: Diagnosis not present

## 2021-01-01 DIAGNOSIS — Z8551 Personal history of malignant neoplasm of bladder: Secondary | ICD-10-CM | POA: Diagnosis not present

## 2021-01-09 ENCOUNTER — Inpatient Hospital Stay: Payer: Medicare Other

## 2021-01-09 ENCOUNTER — Inpatient Hospital Stay: Payer: Medicare Other | Attending: Oncology

## 2021-01-09 ENCOUNTER — Other Ambulatory Visit: Payer: Self-pay

## 2021-01-09 ENCOUNTER — Inpatient Hospital Stay (HOSPITAL_BASED_OUTPATIENT_CLINIC_OR_DEPARTMENT_OTHER): Payer: Medicare Other | Admitting: Oncology

## 2021-01-09 VITALS — BP 134/78 | HR 82 | Temp 97.4°F | Resp 20 | Wt 225.9 lb

## 2021-01-09 DIAGNOSIS — Z95828 Presence of other vascular implants and grafts: Secondary | ICD-10-CM

## 2021-01-09 DIAGNOSIS — C670 Malignant neoplasm of trigone of bladder: Secondary | ICD-10-CM

## 2021-01-09 DIAGNOSIS — Z8551 Personal history of malignant neoplasm of bladder: Secondary | ICD-10-CM | POA: Diagnosis not present

## 2021-01-09 LAB — CMP (CANCER CENTER ONLY)
ALT: 24 U/L (ref 0–44)
AST: 22 U/L (ref 15–41)
Albumin: 3.5 g/dL (ref 3.5–5.0)
Alkaline Phosphatase: 75 U/L (ref 38–126)
Anion gap: 6 (ref 5–15)
BUN: 15 mg/dL (ref 8–23)
CO2: 26 mmol/L (ref 22–32)
Calcium: 9.1 mg/dL (ref 8.9–10.3)
Chloride: 103 mmol/L (ref 98–111)
Creatinine: 1.25 mg/dL — ABNORMAL HIGH (ref 0.61–1.24)
GFR, Estimated: 60 mL/min — ABNORMAL LOW (ref >60)
Glucose, Bld: 234 mg/dL — ABNORMAL HIGH (ref 70–99)
Potassium: 4.3 mmol/L (ref 3.5–5.1)
Sodium: 135 mmol/L (ref 135–145)
Total Bilirubin: 0.7 mg/dL (ref 0.3–1.2)
Total Protein: 7 g/dL (ref 6.5–8.1)

## 2021-01-09 LAB — CBC WITH DIFFERENTIAL (CANCER CENTER ONLY)
Abs Immature Granulocytes: 0.02 10*3/uL (ref 0.00–0.07)
Basophils Absolute: 0 10*3/uL (ref 0.0–0.1)
Basophils Relative: 1 %
Eosinophils Absolute: 0.2 10*3/uL (ref 0.0–0.5)
Eosinophils Relative: 4 %
HCT: 40.9 % (ref 39.0–52.0)
Hemoglobin: 14.1 g/dL (ref 13.0–17.0)
Immature Granulocytes: 0 %
Lymphocytes Relative: 29 %
Lymphs Abs: 1.4 10*3/uL (ref 0.7–4.0)
MCH: 31.5 pg (ref 26.0–34.0)
MCHC: 34.5 g/dL (ref 30.0–36.0)
MCV: 91.5 fL (ref 80.0–100.0)
Monocytes Absolute: 0.6 10*3/uL (ref 0.1–1.0)
Monocytes Relative: 13 %
Neutro Abs: 2.5 10*3/uL (ref 1.7–7.7)
Neutrophils Relative %: 53 %
Platelet Count: 193 10*3/uL (ref 150–400)
RBC: 4.47 MIL/uL (ref 4.22–5.81)
RDW: 12.7 % (ref 11.5–15.5)
WBC Count: 4.7 10*3/uL (ref 4.0–10.5)
nRBC: 0 % (ref 0.0–0.2)

## 2021-01-09 MED ORDER — HEPARIN SOD (PORK) LOCK FLUSH 100 UNIT/ML IV SOLN
500.0000 [IU] | Freq: Once | INTRAVENOUS | Status: AC
  Administered 2021-01-09: 500 [IU]
  Filled 2021-01-09: qty 5

## 2021-01-09 MED ORDER — SODIUM CHLORIDE 0.9% FLUSH
10.0000 mL | Freq: Once | INTRAVENOUS | Status: AC
  Administered 2021-01-09: 10 mL
  Filled 2021-01-09: qty 10

## 2021-01-09 NOTE — Progress Notes (Signed)
Hematology and Oncology Follow Up Visit  Zyrus Hetland [id number] 1946/09/20 75 y.o. 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  He was found to have tumor with 30% squamous cell differentiation and 10% glandular component at the time of diagnosis.   Prior Therapy:  TURBT on April 12, 2019 which confirmed the diagnosis.  Neoadjuvant chemotherapy utilizing gemcitabine and cisplatin started on 05/29/2019.  He is status post 4 cycles of chemotherapy completed July 31, 2019.  He is status post radical cystectomy and lymph node dissection completed on October 10, 2019.  The final pathology showed complete response with T0N0 disease.  Current therapy: Active surveillance.  Interim History: [name] returns today for repeat evaluation.  Since the last visit, he reports no major changes in his health.  He continues to enjoy excellent quality of life without any decline in his performance status or energy.  He denies any nausea, vomiting or abdominal pain.  He denies any hematuria or dysuria.  He denies any complications related to his Port-A-Cath.                 Medications: Reviewed without changes. Current Outpatient Medications  Medication Sig Dispense Refill  . acetaminophen (TYLENOL) 500 MG tablet Take 500 mg by mouth every 6 (six) hours as needed for mild pain, moderate pain or headache.    [name] aspirin 81 MG tablet Take 81 mg by mouth daily.    . carvedilol (COREG) 3.125 MG tablet Take 3.125 mg by mouth daily.    . famotidine (PEPCID) 10 MG tablet Take 10 mg by mouth 2 (two) times daily as needed for heartburn or indigestion.     . insulin glargine (LANTUS) 100 UNIT/ML injection Inject 0.2 mLs (20 Units total) into the skin daily. 10 mL 11  . insulin regular (NOVOLIN R) 100 units/mL injection Inject 30-100 Units into the skin 3 (three) times  daily before meals. Sliding Scale Depended on the glucose reading and meal plan for the next two hours The patient has a Libre device on his arm    . levofloxacin (LEVAQUIN) 750 MG tablet Take 1 tablet (750 mg total) by mouth daily. 7 tablet 0  . lidocaine-prilocaine (EMLA) cream Apply 1 application topically as needed. 30 g 0  . lisinopril (ZESTRIL) 5 MG tablet Take 1 tablet (5 mg total) by mouth daily. For future refills, please discuss with your PCP. 30 tablet 0  . ondansetron (ZOFRAN) 8 MG tablet Take 8 mg by mouth 2 (two) times daily as needed for nausea or vomiting.     . simvastatin (ZOCOR) 20 MG tablet Take 20 mg by mouth at bedtime.      No current facility-administered medications for this visit.     Allergies:  Allergies  Allergen Reactions  . No Known Allergies       Physical Exam:   Blood pressure 134/78, pulse 82, temperature (!) 97.4 F (36.3 C), temperature source Temporal, resp. rate 20, weight 225 lb 14.4 oz (102.5 kg), SpO2 100 %.    ECOG: 0   General appearance: Comfortable appearing without any discomfort Head: Normocephalic without any trauma Oropharynx: Mucous membranes are moist and pink without any thrush or ulcers. Eyes: Pupils are equal and round reactive to light. Lymph nodes: No cervical, supraclavicular, inguinal or axillary lymphadenopathy.   Heart:regular rate and rhythm.  S1 and S2 without leg edema. Lung: Clear without  any rhonchi or wheezes.  No dullness to percussion. Abdomin: Soft, nontender, nondistended with good bowel sounds.  No hepatosplenomegaly. Musculoskeletal: No joint deformity or effusion.  Full range of motion noted. Neurological: No deficits noted on motor, sensory and deep tendon reflex exam. Skin: No petechial rash or dryness.  Appeared moist.          Lab Results: Lab Results  Component Value Date   WBC 3.8 (L) 07/08/2020   HGB 14.2 07/08/2020   HCT 41.7 07/08/2020   MCV 90.7 07/08/2020   PLT 195 07/08/2020      Chemistry      Component Value Date/Time   NA 136 07/08/2020 0943   K 4.1 07/08/2020 0943   CL 102 07/08/2020 0943   CO2 24 07/08/2020 0943   BUN 19 07/08/2020 0943   CREATININE 1.09 07/08/2020 0943      Component Value Date/Time   CALCIUM 9.6 07/08/2020 0943   ALKPHOS 69 07/08/2020 0943   AST 25 07/08/2020 0943   ALT 28 07/08/2020 0943   BILITOT 0.9 07/08/2020 0943      IMPRESSION: 1. Status post Cysto prostatectomy with right abdominal diverting loop ileostomy. 2. There are several loops of small bowel within the right lower quadrant of the abdomen in the region of the loop ileostomy which exhibits mild wall thickening/inflammation. Findings may reflect an inflammatory or infectious enteritis. No evidence for bowel obstruction. 3. There is a small amount of free fluid identified within the abdomen and pelvis. No discrete fluid collection identified. 4. Mild bilateral hydronephrosis. No striated nephrograms identified. No renal abscess noted. 5. Subcutaneous gas within the abdominal wall in bilateral inguinal regions may be related to recent surgery. 6. Aortic atherosclerosis.  Aortic Atherosclerosis (ICD10-I70.0).  Impression and Plan:  75 year old man with:  1.   T2N0 high-grade urothelial carcinoma with squamous and glandular differentiation of the bladder diagnosed in April 2020.  He has no evidence of active disease at this time.   The natural course of this disease was updated at this time and treatment options for the future were reviewed.  He continues to have no evidence of active disease after achieving complete response to therapy.  CT scan in May 2021 showed no evidence of disease relapse at this time.  I recommended continued active surveillance and defer any additional treatment unless he has metastatic disease.  He is agreeable to continue at this time.  2. IV access:Port-A-Cath remains in place.  Potential complications associated with removal  were discussed at this time.  Risks and benefits of keeping it for the time being were reviewed.  He prefers to keep his Port-A-Cath for the time being.   3.  Renal function surveillance: His kidney function remains within normal range at this time.  4. Follow-up: In 6 months for MD follow-up.  He will return in 2 months and in 4 months for Port-A-Cath flush.  30  minutes were spent on this visit.  The time was dedicated to updating his disease status, discussing treatment in the future as well as reviewing previous imaging studies.  Zola Button, MD 1/14/202212:58 PM

## 2021-01-16 DIAGNOSIS — Z79899 Other long term (current) drug therapy: Secondary | ICD-10-CM | POA: Diagnosis not present

## 2021-01-16 DIAGNOSIS — E7800 Pure hypercholesterolemia, unspecified: Secondary | ICD-10-CM | POA: Diagnosis not present

## 2021-01-16 DIAGNOSIS — Z794 Long term (current) use of insulin: Secondary | ICD-10-CM | POA: Diagnosis not present

## 2021-01-16 DIAGNOSIS — I10 Essential (primary) hypertension: Secondary | ICD-10-CM | POA: Diagnosis not present

## 2021-01-16 DIAGNOSIS — E1169 Type 2 diabetes mellitus with other specified complication: Secondary | ICD-10-CM | POA: Diagnosis not present

## 2021-01-21 DIAGNOSIS — E1169 Type 2 diabetes mellitus with other specified complication: Secondary | ICD-10-CM | POA: Diagnosis not present

## 2021-01-21 DIAGNOSIS — I10 Essential (primary) hypertension: Secondary | ICD-10-CM | POA: Diagnosis not present

## 2021-01-21 DIAGNOSIS — E7800 Pure hypercholesterolemia, unspecified: Secondary | ICD-10-CM | POA: Diagnosis not present

## 2021-01-21 DIAGNOSIS — I2510 Atherosclerotic heart disease of native coronary artery without angina pectoris: Secondary | ICD-10-CM | POA: Diagnosis not present

## 2021-02-02 DIAGNOSIS — Z8551 Personal history of malignant neoplasm of bladder: Secondary | ICD-10-CM | POA: Diagnosis not present

## 2021-02-02 DIAGNOSIS — E119 Type 2 diabetes mellitus without complications: Secondary | ICD-10-CM | POA: Diagnosis not present

## 2021-02-02 DIAGNOSIS — Z936 Other artificial openings of urinary tract status: Secondary | ICD-10-CM | POA: Diagnosis not present

## 2021-02-02 DIAGNOSIS — E1169 Type 2 diabetes mellitus with other specified complication: Secondary | ICD-10-CM | POA: Diagnosis not present

## 2021-02-02 DIAGNOSIS — Z794 Long term (current) use of insulin: Secondary | ICD-10-CM | POA: Diagnosis not present

## 2021-03-02 DIAGNOSIS — Z8551 Personal history of malignant neoplasm of bladder: Secondary | ICD-10-CM | POA: Diagnosis not present

## 2021-03-02 DIAGNOSIS — E119 Type 2 diabetes mellitus without complications: Secondary | ICD-10-CM | POA: Diagnosis not present

## 2021-03-02 DIAGNOSIS — E1169 Type 2 diabetes mellitus with other specified complication: Secondary | ICD-10-CM | POA: Diagnosis not present

## 2021-03-02 DIAGNOSIS — Z794 Long term (current) use of insulin: Secondary | ICD-10-CM | POA: Diagnosis not present

## 2021-03-02 DIAGNOSIS — Z936 Other artificial openings of urinary tract status: Secondary | ICD-10-CM | POA: Diagnosis not present

## 2021-03-11 DIAGNOSIS — I10 Essential (primary) hypertension: Secondary | ICD-10-CM | POA: Diagnosis not present

## 2021-03-11 DIAGNOSIS — E1169 Type 2 diabetes mellitus with other specified complication: Secondary | ICD-10-CM | POA: Diagnosis not present

## 2021-03-11 DIAGNOSIS — E7800 Pure hypercholesterolemia, unspecified: Secondary | ICD-10-CM | POA: Diagnosis not present

## 2021-03-11 DIAGNOSIS — E119 Type 2 diabetes mellitus without complications: Secondary | ICD-10-CM | POA: Diagnosis not present

## 2021-03-11 DIAGNOSIS — I2510 Atherosclerotic heart disease of native coronary artery without angina pectoris: Secondary | ICD-10-CM | POA: Diagnosis not present

## 2021-03-16 DIAGNOSIS — J020 Streptococcal pharyngitis: Secondary | ICD-10-CM | POA: Diagnosis not present

## 2021-03-20 DIAGNOSIS — Z794 Long term (current) use of insulin: Secondary | ICD-10-CM | POA: Diagnosis not present

## 2021-03-20 DIAGNOSIS — E119 Type 2 diabetes mellitus without complications: Secondary | ICD-10-CM | POA: Diagnosis not present

## 2021-04-07 DIAGNOSIS — E1169 Type 2 diabetes mellitus with other specified complication: Secondary | ICD-10-CM | POA: Diagnosis not present

## 2021-04-07 DIAGNOSIS — E7800 Pure hypercholesterolemia, unspecified: Secondary | ICD-10-CM | POA: Diagnosis not present

## 2021-04-07 DIAGNOSIS — I2510 Atherosclerotic heart disease of native coronary artery without angina pectoris: Secondary | ICD-10-CM | POA: Diagnosis not present

## 2021-04-07 DIAGNOSIS — E119 Type 2 diabetes mellitus without complications: Secondary | ICD-10-CM | POA: Diagnosis not present

## 2021-04-07 DIAGNOSIS — I10 Essential (primary) hypertension: Secondary | ICD-10-CM | POA: Diagnosis not present

## 2021-04-08 DIAGNOSIS — Z794 Long term (current) use of insulin: Secondary | ICD-10-CM | POA: Diagnosis not present

## 2021-04-08 DIAGNOSIS — E119 Type 2 diabetes mellitus without complications: Secondary | ICD-10-CM | POA: Diagnosis not present

## 2021-04-08 DIAGNOSIS — E1169 Type 2 diabetes mellitus with other specified complication: Secondary | ICD-10-CM | POA: Diagnosis not present

## 2021-04-08 DIAGNOSIS — Z8551 Personal history of malignant neoplasm of bladder: Secondary | ICD-10-CM | POA: Diagnosis not present

## 2021-04-08 DIAGNOSIS — Z936 Other artificial openings of urinary tract status: Secondary | ICD-10-CM | POA: Diagnosis not present

## 2021-04-23 DIAGNOSIS — E119 Type 2 diabetes mellitus without complications: Secondary | ICD-10-CM | POA: Diagnosis not present

## 2021-04-23 DIAGNOSIS — H40013 Open angle with borderline findings, low risk, bilateral: Secondary | ICD-10-CM | POA: Diagnosis not present

## 2021-04-23 DIAGNOSIS — H04123 Dry eye syndrome of bilateral lacrimal glands: Secondary | ICD-10-CM | POA: Diagnosis not present

## 2021-05-03 DIAGNOSIS — R0981 Nasal congestion: Secondary | ICD-10-CM | POA: Diagnosis not present

## 2021-05-03 DIAGNOSIS — U071 COVID-19: Secondary | ICD-10-CM | POA: Diagnosis not present

## 2021-05-03 DIAGNOSIS — J019 Acute sinusitis, unspecified: Secondary | ICD-10-CM | POA: Diagnosis not present

## 2021-05-03 DIAGNOSIS — R059 Cough, unspecified: Secondary | ICD-10-CM | POA: Diagnosis not present

## 2021-05-03 DIAGNOSIS — J40 Bronchitis, not specified as acute or chronic: Secondary | ICD-10-CM | POA: Diagnosis not present

## 2021-05-03 DIAGNOSIS — R509 Fever, unspecified: Secondary | ICD-10-CM | POA: Diagnosis not present

## 2021-05-06 DIAGNOSIS — Z936 Other artificial openings of urinary tract status: Secondary | ICD-10-CM | POA: Diagnosis not present

## 2021-05-06 DIAGNOSIS — E119 Type 2 diabetes mellitus without complications: Secondary | ICD-10-CM | POA: Diagnosis not present

## 2021-05-06 DIAGNOSIS — Z8551 Personal history of malignant neoplasm of bladder: Secondary | ICD-10-CM | POA: Diagnosis not present

## 2021-05-06 DIAGNOSIS — Z794 Long term (current) use of insulin: Secondary | ICD-10-CM | POA: Diagnosis not present

## 2021-05-06 DIAGNOSIS — E1169 Type 2 diabetes mellitus with other specified complication: Secondary | ICD-10-CM | POA: Diagnosis not present

## 2021-05-11 ENCOUNTER — Telehealth: Payer: Self-pay | Admitting: Oncology

## 2021-05-11 NOTE — Telephone Encounter (Signed)
R/s appt per 5/16 sch msg. Pt's wife is aware.

## 2021-05-12 DIAGNOSIS — I10 Essential (primary) hypertension: Secondary | ICD-10-CM | POA: Diagnosis not present

## 2021-05-12 DIAGNOSIS — C672 Malignant neoplasm of lateral wall of bladder: Secondary | ICD-10-CM | POA: Diagnosis not present

## 2021-05-12 DIAGNOSIS — I2510 Atherosclerotic heart disease of native coronary artery without angina pectoris: Secondary | ICD-10-CM | POA: Diagnosis not present

## 2021-05-12 DIAGNOSIS — E1169 Type 2 diabetes mellitus with other specified complication: Secondary | ICD-10-CM | POA: Diagnosis not present

## 2021-05-12 DIAGNOSIS — E7800 Pure hypercholesterolemia, unspecified: Secondary | ICD-10-CM | POA: Diagnosis not present

## 2021-05-13 DIAGNOSIS — E119 Type 2 diabetes mellitus without complications: Secondary | ICD-10-CM | POA: Diagnosis not present

## 2021-05-13 DIAGNOSIS — E1169 Type 2 diabetes mellitus with other specified complication: Secondary | ICD-10-CM | POA: Diagnosis not present

## 2021-05-13 DIAGNOSIS — Z8551 Personal history of malignant neoplasm of bladder: Secondary | ICD-10-CM | POA: Diagnosis not present

## 2021-05-13 DIAGNOSIS — Z794 Long term (current) use of insulin: Secondary | ICD-10-CM | POA: Diagnosis not present

## 2021-05-13 DIAGNOSIS — Z936 Other artificial openings of urinary tract status: Secondary | ICD-10-CM | POA: Diagnosis not present

## 2021-05-15 DIAGNOSIS — E1169 Type 2 diabetes mellitus with other specified complication: Secondary | ICD-10-CM | POA: Diagnosis not present

## 2021-05-15 DIAGNOSIS — I10 Essential (primary) hypertension: Secondary | ICD-10-CM | POA: Diagnosis not present

## 2021-05-15 DIAGNOSIS — Z936 Other artificial openings of urinary tract status: Secondary | ICD-10-CM | POA: Diagnosis not present

## 2021-05-15 DIAGNOSIS — E7800 Pure hypercholesterolemia, unspecified: Secondary | ICD-10-CM | POA: Diagnosis not present

## 2021-05-15 DIAGNOSIS — Z79899 Other long term (current) drug therapy: Secondary | ICD-10-CM | POA: Diagnosis not present

## 2021-05-15 DIAGNOSIS — Z1389 Encounter for screening for other disorder: Secondary | ICD-10-CM | POA: Diagnosis not present

## 2021-05-15 DIAGNOSIS — I700 Atherosclerosis of aorta: Secondary | ICD-10-CM | POA: Diagnosis not present

## 2021-05-15 DIAGNOSIS — Z0000 Encounter for general adult medical examination without abnormal findings: Secondary | ICD-10-CM | POA: Diagnosis not present

## 2021-05-19 ENCOUNTER — Other Ambulatory Visit: Payer: Self-pay

## 2021-05-19 ENCOUNTER — Other Ambulatory Visit (HOSPITAL_COMMUNITY): Payer: Self-pay | Admitting: Urology

## 2021-05-19 ENCOUNTER — Ambulatory Visit (HOSPITAL_COMMUNITY)
Admission: RE | Admit: 2021-05-19 | Discharge: 2021-05-19 | Disposition: A | Discharge status: Home / Self Care | Payer: Medicare Other | Source: Ambulatory Visit | Attending: Urology | Admitting: Urology

## 2021-05-19 DIAGNOSIS — M47814 Spondylosis without myelopathy or radiculopathy, thoracic region: Secondary | ICD-10-CM | POA: Diagnosis not present

## 2021-05-19 DIAGNOSIS — K429 Umbilical hernia without obstruction or gangrene: Secondary | ICD-10-CM | POA: Diagnosis not present

## 2021-05-19 DIAGNOSIS — Z955 Presence of coronary angioplasty implant and graft: Secondary | ICD-10-CM | POA: Diagnosis not present

## 2021-05-19 DIAGNOSIS — C679 Malignant neoplasm of bladder, unspecified: Secondary | ICD-10-CM | POA: Diagnosis not present

## 2021-05-19 DIAGNOSIS — I708 Atherosclerosis of other arteries: Secondary | ICD-10-CM | POA: Diagnosis not present

## 2021-05-19 DIAGNOSIS — C672 Malignant neoplasm of lateral wall of bladder: Secondary | ICD-10-CM

## 2021-05-19 DIAGNOSIS — K760 Fatty (change of) liver, not elsewhere classified: Secondary | ICD-10-CM | POA: Diagnosis not present

## 2021-05-26 DIAGNOSIS — C672 Malignant neoplasm of lateral wall of bladder: Secondary | ICD-10-CM | POA: Diagnosis not present

## 2021-05-27 ENCOUNTER — Other Ambulatory Visit: Payer: Self-pay

## 2021-05-27 ENCOUNTER — Inpatient Hospital Stay: Payer: Medicare Other | Attending: Oncology

## 2021-05-27 DIAGNOSIS — Z8551 Personal history of malignant neoplasm of bladder: Secondary | ICD-10-CM | POA: Insufficient documentation

## 2021-05-27 DIAGNOSIS — C670 Malignant neoplasm of trigone of bladder: Secondary | ICD-10-CM

## 2021-05-27 DIAGNOSIS — Z95828 Presence of other vascular implants and grafts: Secondary | ICD-10-CM

## 2021-05-27 DIAGNOSIS — Z452 Encounter for adjustment and management of vascular access device: Secondary | ICD-10-CM | POA: Insufficient documentation

## 2021-05-27 MED ORDER — SODIUM CHLORIDE 0.9% FLUSH
10.0000 mL | Freq: Once | INTRAVENOUS | Status: AC
  Administered 2021-05-27: 10 mL
  Filled 2021-05-27: qty 10

## 2021-05-27 MED ORDER — HEPARIN SOD (PORK) LOCK FLUSH 100 UNIT/ML IV SOLN
500.0000 [IU] | Freq: Once | INTRAVENOUS | Status: AC
  Administered 2021-05-27: 500 [IU]
  Filled 2021-05-27: qty 5

## 2021-06-03 DIAGNOSIS — Z936 Other artificial openings of urinary tract status: Secondary | ICD-10-CM | POA: Diagnosis not present

## 2021-06-03 DIAGNOSIS — E119 Type 2 diabetes mellitus without complications: Secondary | ICD-10-CM | POA: Diagnosis not present

## 2021-06-03 DIAGNOSIS — Z794 Long term (current) use of insulin: Secondary | ICD-10-CM | POA: Diagnosis not present

## 2021-06-03 DIAGNOSIS — E1169 Type 2 diabetes mellitus with other specified complication: Secondary | ICD-10-CM | POA: Diagnosis not present

## 2021-06-03 DIAGNOSIS — Z8551 Personal history of malignant neoplasm of bladder: Secondary | ICD-10-CM | POA: Diagnosis not present

## 2021-06-09 DIAGNOSIS — E119 Type 2 diabetes mellitus without complications: Secondary | ICD-10-CM | POA: Diagnosis not present

## 2021-06-09 DIAGNOSIS — Z794 Long term (current) use of insulin: Secondary | ICD-10-CM | POA: Diagnosis not present

## 2021-06-22 DIAGNOSIS — Z794 Long term (current) use of insulin: Secondary | ICD-10-CM | POA: Diagnosis not present

## 2021-06-22 DIAGNOSIS — I2510 Atherosclerotic heart disease of native coronary artery without angina pectoris: Secondary | ICD-10-CM | POA: Diagnosis not present

## 2021-06-22 DIAGNOSIS — E119 Type 2 diabetes mellitus without complications: Secondary | ICD-10-CM | POA: Diagnosis not present

## 2021-06-22 DIAGNOSIS — E1169 Type 2 diabetes mellitus with other specified complication: Secondary | ICD-10-CM | POA: Diagnosis not present

## 2021-06-22 DIAGNOSIS — E7800 Pure hypercholesterolemia, unspecified: Secondary | ICD-10-CM | POA: Diagnosis not present

## 2021-06-22 DIAGNOSIS — Z8551 Personal history of malignant neoplasm of bladder: Secondary | ICD-10-CM | POA: Diagnosis not present

## 2021-06-22 DIAGNOSIS — I10 Essential (primary) hypertension: Secondary | ICD-10-CM | POA: Diagnosis not present

## 2021-06-22 DIAGNOSIS — Z936 Other artificial openings of urinary tract status: Secondary | ICD-10-CM | POA: Diagnosis not present

## 2021-07-01 DIAGNOSIS — Z794 Long term (current) use of insulin: Secondary | ICD-10-CM | POA: Diagnosis not present

## 2021-07-01 DIAGNOSIS — Z8551 Personal history of malignant neoplasm of bladder: Secondary | ICD-10-CM | POA: Diagnosis not present

## 2021-07-01 DIAGNOSIS — E1169 Type 2 diabetes mellitus with other specified complication: Secondary | ICD-10-CM | POA: Diagnosis not present

## 2021-07-01 DIAGNOSIS — E119 Type 2 diabetes mellitus without complications: Secondary | ICD-10-CM | POA: Diagnosis not present

## 2021-07-01 DIAGNOSIS — Z936 Other artificial openings of urinary tract status: Secondary | ICD-10-CM | POA: Diagnosis not present

## 2021-07-10 ENCOUNTER — Other Ambulatory Visit: Payer: Self-pay

## 2021-07-10 ENCOUNTER — Other Ambulatory Visit: Payer: Medicare Other

## 2021-07-10 ENCOUNTER — Inpatient Hospital Stay (HOSPITAL_BASED_OUTPATIENT_CLINIC_OR_DEPARTMENT_OTHER): Payer: Medicare Other | Admitting: Oncology

## 2021-07-10 ENCOUNTER — Inpatient Hospital Stay: Payer: Medicare Other | Attending: Oncology

## 2021-07-10 VITALS — BP 139/84 | HR 63 | Temp 97.8°F | Resp 17 | Wt 215.0 lb

## 2021-07-10 DIAGNOSIS — C670 Malignant neoplasm of trigone of bladder: Secondary | ICD-10-CM | POA: Diagnosis not present

## 2021-07-10 DIAGNOSIS — Z95828 Presence of other vascular implants and grafts: Secondary | ICD-10-CM

## 2021-07-10 DIAGNOSIS — Z8551 Personal history of malignant neoplasm of bladder: Secondary | ICD-10-CM | POA: Diagnosis not present

## 2021-07-10 DIAGNOSIS — Z452 Encounter for adjustment and management of vascular access device: Secondary | ICD-10-CM | POA: Insufficient documentation

## 2021-07-10 LAB — CMP (CANCER CENTER ONLY)
ALT: 24 U/L (ref 0–44)
AST: 24 U/L (ref 15–41)
Albumin: 3.4 g/dL — ABNORMAL LOW (ref 3.5–5.0)
Alkaline Phosphatase: 66 U/L (ref 38–126)
Anion gap: 6 (ref 5–15)
BUN: 16 mg/dL (ref 8–23)
CO2: 27 mmol/L (ref 22–32)
Calcium: 9.2 mg/dL (ref 8.9–10.3)
Chloride: 101 mmol/L (ref 98–111)
Creatinine: 1.04 mg/dL (ref 0.61–1.24)
GFR, Estimated: >60 mL/min (ref >60)
Glucose, Bld: 273 mg/dL — ABNORMAL HIGH (ref 70–99)
Potassium: 4.2 mmol/L (ref 3.5–5.1)
Sodium: 134 mmol/L — ABNORMAL LOW (ref 135–145)
Total Bilirubin: 0.8 mg/dL (ref 0.3–1.2)
Total Protein: 6.9 g/dL (ref 6.5–8.1)

## 2021-07-10 LAB — CBC WITH DIFFERENTIAL (CANCER CENTER ONLY)
Abs Immature Granulocytes: 0.01 10*3/uL (ref 0.00–0.07)
Basophils Absolute: 0 10*3/uL (ref 0.0–0.1)
Basophils Relative: 1 %
Eosinophils Absolute: 0.1 10*3/uL (ref 0.0–0.5)
Eosinophils Relative: 2 %
HCT: 39.5 % (ref 39.0–52.0)
Hemoglobin: 13.8 g/dL (ref 13.0–17.0)
Immature Granulocytes: 0 %
Lymphocytes Relative: 24 %
Lymphs Abs: 1.1 10*3/uL (ref 0.7–4.0)
MCH: 31.1 pg (ref 26.0–34.0)
MCHC: 34.9 g/dL (ref 30.0–36.0)
MCV: 89 fL (ref 80.0–100.0)
Monocytes Absolute: 0.6 10*3/uL (ref 0.1–1.0)
Monocytes Relative: 13 %
Neutro Abs: 2.8 10*3/uL (ref 1.7–7.7)
Neutrophils Relative %: 60 %
Platelet Count: 173 10*3/uL (ref 150–400)
RBC: 4.44 MIL/uL (ref 4.22–5.81)
RDW: 13 % (ref 11.5–15.5)
WBC Count: 4.7 10*3/uL (ref 4.0–10.5)
nRBC: 0 % (ref 0.0–0.2)

## 2021-07-10 MED ORDER — HEPARIN SOD (PORK) LOCK FLUSH 100 UNIT/ML IV SOLN
500.0000 [IU] | Freq: Once | INTRAVENOUS | Status: AC
  Administered 2021-07-10: 500 [IU]
  Filled 2021-07-10: qty 5

## 2021-07-10 MED ORDER — SODIUM CHLORIDE 0.9% FLUSH
10.0000 mL | Freq: Once | INTRAVENOUS | Status: AC
  Administered 2021-07-10: 10 mL
  Filled 2021-07-10: qty 10

## 2021-07-10 NOTE — Progress Notes (Signed)
Hematology and Oncology Follow Up Visit  Gregory Cummings [id number] August 19, 1946 75 y.o. 07/10/2021 9:25 AM [name], MDStoneking, Christiane Ha, MD   Principle Diagnosis: 75 year old man with bladder cancer diagnosed in 2020.  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  He had a complete response to systemic chemotherapy and radical cystectomy.   Prior Therapy:  TURBT on April 12, 2019 which confirmed the diagnosis.  Neoadjuvant chemotherapy utilizing gemcitabine and cisplatin started on 05/29/2019.  He is status post 4 cycles of chemotherapy completed July 31, 2019.  He is status post radical cystectomy and lymph node dissection completed on October 10, 2019.  The final pathology showed complete response with T0N0 disease.  Current therapy: Active surveillance.  Interim History: Mr. Clapper is here for a follow-up visit.  Since last visit, he reports no major changes in his health.  He denies any hematuria or dysuria.  He denies any flank pain or discomfort.  His performance status quality of life remained excellent.  He denies any complications related to his Port-A-Cath or urostomy bag.  Continues to attempt activities of daily living.                 Medications: Updated on review. Current Outpatient Medications  Medication Sig Dispense Refill   acetaminophen (TYLENOL) 500 MG tablet Take 500 mg by mouth every 6 (six) hours as needed for mild pain, moderate pain or headache.     aspirin 81 MG tablet Take 81 mg by mouth daily.     carvedilol (COREG) 3.125 MG tablet Take 3.125 mg by mouth daily.     famotidine (PEPCID) 10 MG tablet Take 10 mg by mouth 2 (two) times daily as needed for heartburn or indigestion.      insulin glargine (LANTUS) 100 UNIT/ML injection Inject 0.2 mLs (20 Units total) into the skin daily. 10 mL 11   insulin regular (NOVOLIN R) 100 units/mL injection Inject 30-100  Units into the skin 3 (three) times daily before meals. Sliding Scale Depended on the glucose reading and meal plan for the next two hours The patient has a Libre device on his arm     levofloxacin (LEVAQUIN) 750 MG tablet Take 1 tablet (750 mg total) by mouth daily. 7 tablet 0   lidocaine-prilocaine (EMLA) cream Apply 1 application topically as needed. 30 g 0   lisinopril (ZESTRIL) 5 MG tablet Take 1 tablet (5 mg total) by mouth daily. For future refills, please discuss with your PCP. 30 tablet 0   ondansetron (ZOFRAN) 8 MG tablet Take 8 mg by mouth 2 (two) times daily as needed for nausea or vomiting.      simvastatin (ZOCOR) 20 MG tablet Take 20 mg by mouth at bedtime.      No current facility-administered medications for this visit.     Allergies:  Allergies  Allergen Reactions   No Known Allergies       Physical Exam:   Blood pressure 139/84, pulse 63, temperature 97.8 F (36.6 C), temperature source Oral, resp. rate 17, weight 215 lb (97.5 kg), SpO2 97 %.    ECOG: 0    General appearance: Alert, awake without any distress. Head: Atraumatic without abnormalities Oropharynx: Without any thrush or ulcers. Eyes: No scleral icterus. Lymph nodes: No lymphadenopathy noted in the cervical, supraclavicular, or axillary nodes Heart:regular rate and rhythm, without any murmurs or gallops.   Lung: Clear to auscultation without any rhonchi, wheezes or dullness  to percussion. Abdomin: Soft, nontender without any shifting dullness or ascites. Musculoskeletal: No clubbing or cyanosis. Neurological: No motor or sensory deficits. Skin: No rashes or lesions.          Lab Results: Lab Results  Component Value Date   WBC 4.7 01/09/2021   HGB 14.1 01/09/2021   HCT 40.9 01/09/2021   MCV 91.5 01/09/2021   PLT 193 01/09/2021     Chemistry      Component Value Date/Time   NA 135 01/09/2021 1312   K 4.3 01/09/2021 1312   CL 103 01/09/2021 1312   CO2 26 01/09/2021 1312    BUN 15 01/09/2021 1312   CREATININE 1.25 (H) 01/09/2021 1312      Component Value Date/Time   CALCIUM 9.1 01/09/2021 1312   ALKPHOS 75 01/09/2021 1312   AST 22 01/09/2021 1312   ALT 24 01/09/2021 1312   BILITOT 0.7 01/09/2021 1312        Impression and Plan:  75 year old man with:   1.   Bladder cancer diagnosed in April 2020.  He was found to have T2N0 high-grade urothelial carcinoma with squamous and glandular differentiation and achieved a complete response after surgical resection.   He is currently on active surveillance without any evidence of relapsed disease.  CT scan and chest x-ray obtained in May 2022 showed no evidence of disease recurrence at this time.  The role for salvage systemic therapy utilizing immunotherapy were reviewed and it would be used only if he has metastatic disease at this time.  I recommended continued active surveillance with imaging studies that he is currently receiving under the care of Dr. Barbee Cough.  He is agreeable to continuing this approach.     2.  Port-A-Cath management: Risks and benefits of Port-A-Cath removal were discussed at this time.  He prefers to keep it for the time being   3.  Renal function surveillance: Creatinine clearance remains close to baseline.   4.  Follow-up: Every 2 months for Port-A-Cath flush.  MD follow-up will be in 6 months.  30  minutes were dedicated to this encounter.  The time was spent on reviewing imaging studies, treatment choices for the future and answering questions regarding future plan of care.  Zola Button, MD 7/15/20229:25 AM

## 2021-07-16 DIAGNOSIS — I10 Essential (primary) hypertension: Secondary | ICD-10-CM | POA: Diagnosis not present

## 2021-07-16 DIAGNOSIS — I2510 Atherosclerotic heart disease of native coronary artery without angina pectoris: Secondary | ICD-10-CM | POA: Diagnosis not present

## 2021-07-16 DIAGNOSIS — E7800 Pure hypercholesterolemia, unspecified: Secondary | ICD-10-CM | POA: Diagnosis not present

## 2021-07-16 DIAGNOSIS — E1169 Type 2 diabetes mellitus with other specified complication: Secondary | ICD-10-CM | POA: Diagnosis not present

## 2021-08-03 DIAGNOSIS — Z794 Long term (current) use of insulin: Secondary | ICD-10-CM | POA: Diagnosis not present

## 2021-08-03 DIAGNOSIS — E1169 Type 2 diabetes mellitus with other specified complication: Secondary | ICD-10-CM | POA: Diagnosis not present

## 2021-08-03 DIAGNOSIS — E119 Type 2 diabetes mellitus without complications: Secondary | ICD-10-CM | POA: Diagnosis not present

## 2021-08-03 DIAGNOSIS — Z936 Other artificial openings of urinary tract status: Secondary | ICD-10-CM | POA: Diagnosis not present

## 2021-08-03 DIAGNOSIS — Z8551 Personal history of malignant neoplasm of bladder: Secondary | ICD-10-CM | POA: Diagnosis not present

## 2021-08-20 DIAGNOSIS — I2510 Atherosclerotic heart disease of native coronary artery without angina pectoris: Secondary | ICD-10-CM | POA: Diagnosis not present

## 2021-08-20 DIAGNOSIS — E1169 Type 2 diabetes mellitus with other specified complication: Secondary | ICD-10-CM | POA: Diagnosis not present

## 2021-08-20 DIAGNOSIS — E7800 Pure hypercholesterolemia, unspecified: Secondary | ICD-10-CM | POA: Diagnosis not present

## 2021-08-20 DIAGNOSIS — I10 Essential (primary) hypertension: Secondary | ICD-10-CM | POA: Diagnosis not present

## 2021-08-28 DIAGNOSIS — Z794 Long term (current) use of insulin: Secondary | ICD-10-CM | POA: Diagnosis not present

## 2021-08-28 DIAGNOSIS — E119 Type 2 diabetes mellitus without complications: Secondary | ICD-10-CM | POA: Diagnosis not present

## 2021-08-31 DIAGNOSIS — E119 Type 2 diabetes mellitus without complications: Secondary | ICD-10-CM | POA: Diagnosis not present

## 2021-08-31 DIAGNOSIS — Z936 Other artificial openings of urinary tract status: Secondary | ICD-10-CM | POA: Diagnosis not present

## 2021-08-31 DIAGNOSIS — Z8551 Personal history of malignant neoplasm of bladder: Secondary | ICD-10-CM | POA: Diagnosis not present

## 2021-08-31 DIAGNOSIS — Z794 Long term (current) use of insulin: Secondary | ICD-10-CM | POA: Diagnosis not present

## 2021-08-31 DIAGNOSIS — E1169 Type 2 diabetes mellitus with other specified complication: Secondary | ICD-10-CM | POA: Diagnosis not present

## 2021-09-11 ENCOUNTER — Inpatient Hospital Stay: Payer: Medicare Other | Attending: Oncology

## 2021-09-11 ENCOUNTER — Other Ambulatory Visit: Payer: Self-pay

## 2021-09-11 DIAGNOSIS — Z8551 Personal history of malignant neoplasm of bladder: Secondary | ICD-10-CM | POA: Diagnosis not present

## 2021-09-11 DIAGNOSIS — C670 Malignant neoplasm of trigone of bladder: Secondary | ICD-10-CM

## 2021-09-11 DIAGNOSIS — Z95828 Presence of other vascular implants and grafts: Secondary | ICD-10-CM

## 2021-09-11 DIAGNOSIS — Z452 Encounter for adjustment and management of vascular access device: Secondary | ICD-10-CM | POA: Diagnosis not present

## 2021-09-11 MED ORDER — SODIUM CHLORIDE 0.9% FLUSH
10.0000 mL | Freq: Once | INTRAVENOUS | Status: AC
  Administered 2021-09-11: 10 mL

## 2021-09-11 MED ORDER — HEPARIN SOD (PORK) LOCK FLUSH 100 UNIT/ML IV SOLN
500.0000 [IU] | Freq: Once | INTRAVENOUS | Status: AC
  Administered 2021-09-11: 500 [IU]

## 2021-10-01 DIAGNOSIS — E119 Type 2 diabetes mellitus without complications: Secondary | ICD-10-CM | POA: Diagnosis not present

## 2021-10-01 DIAGNOSIS — I2510 Atherosclerotic heart disease of native coronary artery without angina pectoris: Secondary | ICD-10-CM | POA: Diagnosis not present

## 2021-10-01 DIAGNOSIS — E1169 Type 2 diabetes mellitus with other specified complication: Secondary | ICD-10-CM | POA: Diagnosis not present

## 2021-10-01 DIAGNOSIS — Z936 Other artificial openings of urinary tract status: Secondary | ICD-10-CM | POA: Diagnosis not present

## 2021-10-01 DIAGNOSIS — E7800 Pure hypercholesterolemia, unspecified: Secondary | ICD-10-CM | POA: Diagnosis not present

## 2021-10-01 DIAGNOSIS — Z8551 Personal history of malignant neoplasm of bladder: Secondary | ICD-10-CM | POA: Diagnosis not present

## 2021-10-01 DIAGNOSIS — I10 Essential (primary) hypertension: Secondary | ICD-10-CM | POA: Diagnosis not present

## 2021-10-01 DIAGNOSIS — Z794 Long term (current) use of insulin: Secondary | ICD-10-CM | POA: Diagnosis not present

## 2021-11-02 DIAGNOSIS — Z8551 Personal history of malignant neoplasm of bladder: Secondary | ICD-10-CM | POA: Diagnosis not present

## 2021-11-02 DIAGNOSIS — E119 Type 2 diabetes mellitus without complications: Secondary | ICD-10-CM | POA: Diagnosis not present

## 2021-11-02 DIAGNOSIS — Z936 Other artificial openings of urinary tract status: Secondary | ICD-10-CM | POA: Diagnosis not present

## 2021-11-02 DIAGNOSIS — E1169 Type 2 diabetes mellitus with other specified complication: Secondary | ICD-10-CM | POA: Diagnosis not present

## 2021-11-02 DIAGNOSIS — Z794 Long term (current) use of insulin: Secondary | ICD-10-CM | POA: Diagnosis not present

## 2021-11-06 ENCOUNTER — Inpatient Hospital Stay: Payer: Medicare Other | Attending: Oncology

## 2021-11-06 ENCOUNTER — Other Ambulatory Visit: Payer: Self-pay

## 2021-11-06 DIAGNOSIS — Z95828 Presence of other vascular implants and grafts: Secondary | ICD-10-CM

## 2021-11-06 DIAGNOSIS — Z8551 Personal history of malignant neoplasm of bladder: Secondary | ICD-10-CM | POA: Diagnosis not present

## 2021-11-06 DIAGNOSIS — Z452 Encounter for adjustment and management of vascular access device: Secondary | ICD-10-CM | POA: Insufficient documentation

## 2021-11-06 DIAGNOSIS — C670 Malignant neoplasm of trigone of bladder: Secondary | ICD-10-CM

## 2021-11-06 MED ORDER — SODIUM CHLORIDE 0.9% FLUSH
10.0000 mL | Freq: Once | INTRAVENOUS | Status: AC
  Administered 2021-11-06: 10 mL

## 2021-11-06 MED ORDER — HEPARIN SOD (PORK) LOCK FLUSH 100 UNIT/ML IV SOLN
500.0000 [IU] | Freq: Once | INTRAVENOUS | Status: AC
  Administered 2021-11-06: 500 [IU]

## 2021-11-13 DIAGNOSIS — Z936 Other artificial openings of urinary tract status: Secondary | ICD-10-CM | POA: Diagnosis not present

## 2021-11-13 DIAGNOSIS — E1169 Type 2 diabetes mellitus with other specified complication: Secondary | ICD-10-CM | POA: Diagnosis not present

## 2021-11-13 DIAGNOSIS — Z79899 Other long term (current) drug therapy: Secondary | ICD-10-CM | POA: Diagnosis not present

## 2021-11-13 DIAGNOSIS — I10 Essential (primary) hypertension: Secondary | ICD-10-CM | POA: Diagnosis not present

## 2021-11-13 DIAGNOSIS — I2510 Atherosclerotic heart disease of native coronary artery without angina pectoris: Secondary | ICD-10-CM | POA: Diagnosis not present

## 2021-11-26 DIAGNOSIS — Z794 Long term (current) use of insulin: Secondary | ICD-10-CM | POA: Diagnosis not present

## 2021-11-26 DIAGNOSIS — E119 Type 2 diabetes mellitus without complications: Secondary | ICD-10-CM | POA: Diagnosis not present

## 2021-12-02 DIAGNOSIS — E119 Type 2 diabetes mellitus without complications: Secondary | ICD-10-CM | POA: Diagnosis not present

## 2021-12-02 DIAGNOSIS — E1169 Type 2 diabetes mellitus with other specified complication: Secondary | ICD-10-CM | POA: Diagnosis not present

## 2021-12-02 DIAGNOSIS — Z794 Long term (current) use of insulin: Secondary | ICD-10-CM | POA: Diagnosis not present

## 2021-12-02 DIAGNOSIS — Z8551 Personal history of malignant neoplasm of bladder: Secondary | ICD-10-CM | POA: Diagnosis not present

## 2021-12-02 DIAGNOSIS — Z936 Other artificial openings of urinary tract status: Secondary | ICD-10-CM | POA: Diagnosis not present

## 2022-01-04 DIAGNOSIS — Z794 Long term (current) use of insulin: Secondary | ICD-10-CM | POA: Diagnosis not present

## 2022-01-04 DIAGNOSIS — Z8551 Personal history of malignant neoplasm of bladder: Secondary | ICD-10-CM | POA: Diagnosis not present

## 2022-01-04 DIAGNOSIS — E1169 Type 2 diabetes mellitus with other specified complication: Secondary | ICD-10-CM | POA: Diagnosis not present

## 2022-01-04 DIAGNOSIS — E119 Type 2 diabetes mellitus without complications: Secondary | ICD-10-CM | POA: Diagnosis not present

## 2022-01-04 DIAGNOSIS — Z936 Other artificial openings of urinary tract status: Secondary | ICD-10-CM | POA: Diagnosis not present

## 2022-01-08 ENCOUNTER — Inpatient Hospital Stay: Payer: Medicare Other | Attending: Oncology

## 2022-01-08 ENCOUNTER — Other Ambulatory Visit: Payer: Self-pay

## 2022-01-08 ENCOUNTER — Inpatient Hospital Stay: Payer: Medicare Other | Admitting: Oncology

## 2022-01-08 VITALS — BP 134/78 | HR 68 | Temp 97.6°F | Resp 17 | Ht 69.0 in | Wt 221.1 lb

## 2022-01-08 DIAGNOSIS — Z8551 Personal history of malignant neoplasm of bladder: Secondary | ICD-10-CM | POA: Diagnosis not present

## 2022-01-08 DIAGNOSIS — Z95828 Presence of other vascular implants and grafts: Secondary | ICD-10-CM

## 2022-01-08 DIAGNOSIS — C670 Malignant neoplasm of trigone of bladder: Secondary | ICD-10-CM

## 2022-01-08 MED ORDER — HEPARIN SOD (PORK) LOCK FLUSH 100 UNIT/ML IV SOLN
500.0000 [IU] | Freq: Once | INTRAVENOUS | Status: AC
Start: 2022-01-08 — End: 2022-01-08
  Administered 2022-01-08: 500 [IU]

## 2022-01-08 MED ORDER — SODIUM CHLORIDE 0.9% FLUSH
10.0000 mL | Freq: Once | INTRAVENOUS | Status: AC
  Administered 2022-01-08: 10 mL

## 2022-01-08 NOTE — Progress Notes (Signed)
Hematology and Oncology Follow Up Visit  Gregory Cummings [id number] [birth year] 76 y.o. 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     Prior Therapy:  TURBT on April 12, 2019 which confirmed the diagnosis.  Neoadjuvant chemotherapy utilizing gemcitabine and cisplatin started on 05/29/2019.  He is status post 4 cycles of chemotherapy completed July 31, 2019.  He is status post radical cystectomy and lymph node dissection completed on October 10, 2019.  The final pathology showed complete response with T0N0 disease.  Current therapy: Active surveillance.  Interim History: [name] returns today for a follow-up visit.  Since the last visit, he reports feeling well without any major complaints.  He denies any recent hospitalizations or illnesses.  He denies any nausea, abdominal pain or hematochezia.  His performance status and quality of life remained excellent.                 Medications: Reviewed without changes. Current Outpatient Medications  Medication Sig Dispense Refill   acetaminophen (TYLENOL) 500 MG tablet Take 500 mg by mouth every 6 (six) hours as needed for mild pain, moderate pain or headache.     aspirin 81 MG tablet Take 81 mg by mouth daily.     carvedilol (COREG) 3.125 MG tablet Take 3.125 mg by mouth daily.     famotidine (PEPCID) 10 MG tablet Take 10 mg by mouth 2 (two) times daily as needed for heartburn or indigestion.      insulin glargine (LANTUS) 100 UNIT/ML injection Inject 0.2 mLs (20 Units total) into the skin daily. 10 mL 11   insulin regular (NOVOLIN R) 100 units/mL injection Inject 30-100 Units into the skin 3 (three) times daily before meals. Sliding Scale Depended on the glucose reading and meal plan for the next two hours The patient has a  Libre device on his arm     levofloxacin (LEVAQUIN) 750 MG tablet Take 1 tablet (750 mg total) by mouth daily. 7 tablet 0   lidocaine-prilocaine (EMLA) cream Apply 1 application topically as needed. 30 g 0   lisinopril (ZESTRIL) 5 MG tablet Take 1 tablet (5 mg total) by mouth daily. For future refills, please discuss with your PCP. 30 tablet 0   ondansetron (ZOFRAN) 8 MG tablet Take 8 mg by mouth 2 (two) times daily as needed for nausea or vomiting.      simvastatin (ZOCOR) 20 MG tablet Take 20 mg by mouth at bedtime.      No current facility-administered medications for this visit.     Allergies:  Allergies  Allergen Reactions   No Known Allergies       Physical Exam:   Blood pressure 134/78, pulse 68, temperature 97.6 F (36.4 C), temperature source Axillary, resp. rate 17, height 5\' 9"  (1.753 m), weight 221 lb 1.6 oz (100.3 kg), SpO2 98 %.    ECOG: 0   General appearance: Comfortable appearing without any discomfort Head: Normocephalic without any trauma Oropharynx: Mucous membranes are moist and pink without any thrush or ulcers. Eyes: Pupils are equal and round reactive to light. Lymph nodes: No cervical, supraclavicular, inguinal or axillary lymphadenopathy.   Heart:regular rate and rhythm.  S1 and S2 without leg edema. Lung: Clear without any rhonchi or wheezes.  No dullness to percussion. Abdomin: Soft, nontender, nondistended with good bowel sounds.  No hepatosplenomegaly. Musculoskeletal:  No joint deformity or effusion.  Full range of motion noted. Neurological: No deficits noted on motor, sensory and deep tendon reflex exam. Skin: No petechial rash or dryness.  Appeared moist.           Lab Results: Lab Results  Component Value Date   WBC 4.7 07/10/2021   HGB 13.8 07/10/2021   HCT 39.5 07/10/2021   MCV 89.0 07/10/2021   PLT 173 07/10/2021     Chemistry      Component Value Date/Time   NA 134 (L) 07/10/2021 0934   K 4.2 07/10/2021 0934   CL 101  07/10/2021 0934   CO2 27 07/10/2021 0934   BUN 16 07/10/2021 0934   CREATININE 1.04 07/10/2021 0934      Component Value Date/Time   CALCIUM 9.2 07/10/2021 0934   ALKPHOS 66 07/10/2021 0934   AST 24 07/10/2021 0934   ALT 24 07/10/2021 0934   BILITOT 0.8 07/10/2021 0934        Impression and Plan:  76 year old man with:   1.  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  He achieved a complete response after surgical resection and neoadjuvant chemotherapy.   The natural course of his disease was reviewed at this time and treatment choices were reiterated.  He continues to show no evidence of relapsed disease after definitive therapy.  Risk of relapse was assessed at this time we will continue to have annual imaging studies by Dr. Tresa Moore.  Different salvage therapy options including immunotherapy among others were reiterated.  He is will be used if he has metastatic disease.     2.  Port-A-Cath management: He continues to receive flushed periodically.  He opted to keep it at this time after discussion.   3.  Follow-up: We will continue to receive the Port-A-Cath flush every 2 months and MD follow-up in 6 months.  30  minutes were spent on this visit.  The time was dedicated to reviewing laboratory data, disease status update, treatment choices and future plan of care discussion.  Zola Button, MD 1/13/20239:56 AM

## 2022-01-15 DIAGNOSIS — Z794 Long term (current) use of insulin: Secondary | ICD-10-CM | POA: Diagnosis not present

## 2022-01-15 DIAGNOSIS — Z87891 Personal history of nicotine dependence: Secondary | ICD-10-CM | POA: Diagnosis not present

## 2022-01-15 DIAGNOSIS — I2510 Atherosclerotic heart disease of native coronary artery without angina pectoris: Secondary | ICD-10-CM | POA: Diagnosis not present

## 2022-01-15 DIAGNOSIS — E789 Disorder of lipoprotein metabolism, unspecified: Secondary | ICD-10-CM | POA: Diagnosis not present

## 2022-01-15 DIAGNOSIS — Z7982 Long term (current) use of aspirin: Secondary | ICD-10-CM | POA: Diagnosis not present

## 2022-01-15 DIAGNOSIS — I10 Essential (primary) hypertension: Secondary | ICD-10-CM | POA: Diagnosis not present

## 2022-01-15 DIAGNOSIS — E1169 Type 2 diabetes mellitus with other specified complication: Secondary | ICD-10-CM | POA: Diagnosis not present

## 2022-02-02 DIAGNOSIS — E1169 Type 2 diabetes mellitus with other specified complication: Secondary | ICD-10-CM | POA: Diagnosis not present

## 2022-02-02 DIAGNOSIS — Z8551 Personal history of malignant neoplasm of bladder: Secondary | ICD-10-CM | POA: Diagnosis not present

## 2022-02-02 DIAGNOSIS — Z794 Long term (current) use of insulin: Secondary | ICD-10-CM | POA: Diagnosis not present

## 2022-02-02 DIAGNOSIS — Z936 Other artificial openings of urinary tract status: Secondary | ICD-10-CM | POA: Diagnosis not present

## 2022-02-02 DIAGNOSIS — E119 Type 2 diabetes mellitus without complications: Secondary | ICD-10-CM | POA: Diagnosis not present

## 2022-02-22 DIAGNOSIS — E119 Type 2 diabetes mellitus without complications: Secondary | ICD-10-CM | POA: Diagnosis not present

## 2022-02-22 DIAGNOSIS — Z794 Long term (current) use of insulin: Secondary | ICD-10-CM | POA: Diagnosis not present

## 2022-03-01 DIAGNOSIS — E1169 Type 2 diabetes mellitus with other specified complication: Secondary | ICD-10-CM | POA: Diagnosis not present

## 2022-03-01 DIAGNOSIS — Z794 Long term (current) use of insulin: Secondary | ICD-10-CM | POA: Diagnosis not present

## 2022-03-01 DIAGNOSIS — Z8551 Personal history of malignant neoplasm of bladder: Secondary | ICD-10-CM | POA: Diagnosis not present

## 2022-03-01 DIAGNOSIS — Z936 Other artificial openings of urinary tract status: Secondary | ICD-10-CM | POA: Diagnosis not present

## 2022-03-01 DIAGNOSIS — E119 Type 2 diabetes mellitus without complications: Secondary | ICD-10-CM | POA: Diagnosis not present

## 2022-03-05 ENCOUNTER — Other Ambulatory Visit: Payer: Self-pay

## 2022-03-05 ENCOUNTER — Inpatient Hospital Stay: Payer: Medicare Other | Attending: Oncology

## 2022-03-05 DIAGNOSIS — C670 Malignant neoplasm of trigone of bladder: Secondary | ICD-10-CM

## 2022-03-05 DIAGNOSIS — C679 Malignant neoplasm of bladder, unspecified: Secondary | ICD-10-CM | POA: Insufficient documentation

## 2022-03-05 DIAGNOSIS — Z95828 Presence of other vascular implants and grafts: Secondary | ICD-10-CM

## 2022-03-05 DIAGNOSIS — Z452 Encounter for adjustment and management of vascular access device: Secondary | ICD-10-CM | POA: Diagnosis not present

## 2022-03-05 MED ORDER — SODIUM CHLORIDE 0.9% FLUSH
10.0000 mL | Freq: Once | INTRAVENOUS | Status: AC
  Administered 2022-03-05: 10 mL

## 2022-03-05 MED ORDER — HEPARIN SOD (PORK) LOCK FLUSH 100 UNIT/ML IV SOLN
500.0000 [IU] | Freq: Once | INTRAVENOUS | Status: AC
  Administered 2022-03-05: 500 [IU]

## 2022-03-08 DIAGNOSIS — E7800 Pure hypercholesterolemia, unspecified: Secondary | ICD-10-CM | POA: Diagnosis not present

## 2022-03-08 DIAGNOSIS — I10 Essential (primary) hypertension: Secondary | ICD-10-CM | POA: Diagnosis not present

## 2022-03-08 DIAGNOSIS — E1169 Type 2 diabetes mellitus with other specified complication: Secondary | ICD-10-CM | POA: Diagnosis not present

## 2022-03-25 DIAGNOSIS — Z794 Long term (current) use of insulin: Secondary | ICD-10-CM | POA: Diagnosis not present

## 2022-03-25 DIAGNOSIS — E119 Type 2 diabetes mellitus without complications: Secondary | ICD-10-CM | POA: Diagnosis not present

## 2022-04-01 DIAGNOSIS — Z8551 Personal history of malignant neoplasm of bladder: Secondary | ICD-10-CM | POA: Diagnosis not present

## 2022-04-01 DIAGNOSIS — Z936 Other artificial openings of urinary tract status: Secondary | ICD-10-CM | POA: Diagnosis not present

## 2022-04-01 DIAGNOSIS — E119 Type 2 diabetes mellitus without complications: Secondary | ICD-10-CM | POA: Diagnosis not present

## 2022-04-01 DIAGNOSIS — Z794 Long term (current) use of insulin: Secondary | ICD-10-CM | POA: Diagnosis not present

## 2022-04-01 DIAGNOSIS — E1169 Type 2 diabetes mellitus with other specified complication: Secondary | ICD-10-CM | POA: Diagnosis not present

## 2022-04-25 DIAGNOSIS — Z794 Long term (current) use of insulin: Secondary | ICD-10-CM | POA: Diagnosis not present

## 2022-04-25 DIAGNOSIS — E119 Type 2 diabetes mellitus without complications: Secondary | ICD-10-CM | POA: Diagnosis not present

## 2022-04-26 DIAGNOSIS — H40013 Open angle with borderline findings, low risk, bilateral: Secondary | ICD-10-CM | POA: Diagnosis not present

## 2022-04-26 DIAGNOSIS — H04123 Dry eye syndrome of bilateral lacrimal glands: Secondary | ICD-10-CM | POA: Diagnosis not present

## 2022-04-26 DIAGNOSIS — E119 Type 2 diabetes mellitus without complications: Secondary | ICD-10-CM | POA: Diagnosis not present

## 2022-04-26 DIAGNOSIS — H35371 Puckering of macula, right eye: Secondary | ICD-10-CM | POA: Diagnosis not present

## 2022-04-30 DIAGNOSIS — E119 Type 2 diabetes mellitus without complications: Secondary | ICD-10-CM | POA: Diagnosis not present

## 2022-04-30 DIAGNOSIS — Z936 Other artificial openings of urinary tract status: Secondary | ICD-10-CM | POA: Diagnosis not present

## 2022-04-30 DIAGNOSIS — Z8551 Personal history of malignant neoplasm of bladder: Secondary | ICD-10-CM | POA: Diagnosis not present

## 2022-04-30 DIAGNOSIS — E1169 Type 2 diabetes mellitus with other specified complication: Secondary | ICD-10-CM | POA: Diagnosis not present

## 2022-04-30 DIAGNOSIS — Z794 Long term (current) use of insulin: Secondary | ICD-10-CM | POA: Diagnosis not present

## 2022-05-07 ENCOUNTER — Inpatient Hospital Stay: Payer: Medicare Other | Attending: Oncology

## 2022-05-07 ENCOUNTER — Other Ambulatory Visit: Payer: Self-pay

## 2022-05-07 DIAGNOSIS — C670 Malignant neoplasm of trigone of bladder: Secondary | ICD-10-CM

## 2022-05-07 DIAGNOSIS — C679 Malignant neoplasm of bladder, unspecified: Secondary | ICD-10-CM | POA: Diagnosis not present

## 2022-05-07 DIAGNOSIS — Z452 Encounter for adjustment and management of vascular access device: Secondary | ICD-10-CM | POA: Insufficient documentation

## 2022-05-07 DIAGNOSIS — Z95828 Presence of other vascular implants and grafts: Secondary | ICD-10-CM

## 2022-05-07 MED ORDER — HEPARIN SOD (PORK) LOCK FLUSH 100 UNIT/ML IV SOLN
500.0000 [IU] | Freq: Once | INTRAVENOUS | Status: AC
  Administered 2022-05-07: 500 [IU]

## 2022-05-07 MED ORDER — SODIUM CHLORIDE 0.9% FLUSH
10.0000 mL | Freq: Once | INTRAVENOUS | Status: AC
  Administered 2022-05-07: 10 mL

## 2022-05-13 DIAGNOSIS — Z794 Long term (current) use of insulin: Secondary | ICD-10-CM | POA: Diagnosis not present

## 2022-05-13 DIAGNOSIS — E119 Type 2 diabetes mellitus without complications: Secondary | ICD-10-CM | POA: Diagnosis not present

## 2022-05-19 ENCOUNTER — Ambulatory Visit (HOSPITAL_COMMUNITY)
Admission: RE | Admit: 2022-05-19 | Discharge: 2022-05-19 | Disposition: A | Discharge status: Home / Self Care | Payer: Medicare Other | Source: Ambulatory Visit | Attending: Urology | Admitting: Urology

## 2022-05-19 ENCOUNTER — Other Ambulatory Visit (HOSPITAL_COMMUNITY): Payer: Self-pay | Admitting: Urology

## 2022-05-19 DIAGNOSIS — C672 Malignant neoplasm of lateral wall of bladder: Secondary | ICD-10-CM

## 2022-05-19 DIAGNOSIS — C679 Malignant neoplasm of bladder, unspecified: Secondary | ICD-10-CM | POA: Diagnosis not present

## 2022-05-19 DIAGNOSIS — R918 Other nonspecific abnormal finding of lung field: Secondary | ICD-10-CM | POA: Diagnosis not present

## 2022-05-26 DIAGNOSIS — N289 Disorder of kidney and ureter, unspecified: Secondary | ICD-10-CM | POA: Diagnosis not present

## 2022-05-26 DIAGNOSIS — C672 Malignant neoplasm of lateral wall of bladder: Secondary | ICD-10-CM | POA: Diagnosis not present

## 2022-05-31 DIAGNOSIS — C672 Malignant neoplasm of lateral wall of bladder: Secondary | ICD-10-CM | POA: Diagnosis not present

## 2022-06-11 DIAGNOSIS — Z8551 Personal history of malignant neoplasm of bladder: Secondary | ICD-10-CM | POA: Diagnosis not present

## 2022-06-11 DIAGNOSIS — Z79899 Other long term (current) drug therapy: Secondary | ICD-10-CM | POA: Diagnosis not present

## 2022-06-11 DIAGNOSIS — E7800 Pure hypercholesterolemia, unspecified: Secondary | ICD-10-CM | POA: Diagnosis not present

## 2022-06-11 DIAGNOSIS — E1169 Type 2 diabetes mellitus with other specified complication: Secondary | ICD-10-CM | POA: Diagnosis not present

## 2022-06-11 DIAGNOSIS — I10 Essential (primary) hypertension: Secondary | ICD-10-CM | POA: Diagnosis not present

## 2022-06-11 DIAGNOSIS — I700 Atherosclerosis of aorta: Secondary | ICD-10-CM | POA: Diagnosis not present

## 2022-06-11 DIAGNOSIS — Z936 Other artificial openings of urinary tract status: Secondary | ICD-10-CM | POA: Diagnosis not present

## 2022-06-11 DIAGNOSIS — Z0000 Encounter for general adult medical examination without abnormal findings: Secondary | ICD-10-CM | POA: Diagnosis not present

## 2022-06-13 DIAGNOSIS — E119 Type 2 diabetes mellitus without complications: Secondary | ICD-10-CM | POA: Diagnosis not present

## 2022-06-13 DIAGNOSIS — Z794 Long term (current) use of insulin: Secondary | ICD-10-CM | POA: Diagnosis not present

## 2022-06-30 DIAGNOSIS — Z8551 Personal history of malignant neoplasm of bladder: Secondary | ICD-10-CM | POA: Diagnosis not present

## 2022-06-30 DIAGNOSIS — Z794 Long term (current) use of insulin: Secondary | ICD-10-CM | POA: Diagnosis not present

## 2022-06-30 DIAGNOSIS — E1169 Type 2 diabetes mellitus with other specified complication: Secondary | ICD-10-CM | POA: Diagnosis not present

## 2022-06-30 DIAGNOSIS — E119 Type 2 diabetes mellitus without complications: Secondary | ICD-10-CM | POA: Diagnosis not present

## 2022-06-30 DIAGNOSIS — Z936 Other artificial openings of urinary tract status: Secondary | ICD-10-CM | POA: Diagnosis not present

## 2022-07-09 ENCOUNTER — Inpatient Hospital Stay: Payer: Medicare Other | Admitting: Oncology

## 2022-07-09 ENCOUNTER — Inpatient Hospital Stay: Payer: Medicare Other | Attending: Oncology

## 2022-07-09 ENCOUNTER — Other Ambulatory Visit: Payer: Self-pay

## 2022-07-09 VITALS — BP 101/63 | HR 64 | Temp 97.9°F | Resp 18 | Ht 69.0 in | Wt 216.3 lb

## 2022-07-09 DIAGNOSIS — C670 Malignant neoplasm of trigone of bladder: Secondary | ICD-10-CM | POA: Diagnosis not present

## 2022-07-09 DIAGNOSIS — Z8551 Personal history of malignant neoplasm of bladder: Secondary | ICD-10-CM | POA: Insufficient documentation

## 2022-07-09 DIAGNOSIS — Z9221 Personal history of antineoplastic chemotherapy: Secondary | ICD-10-CM | POA: Insufficient documentation

## 2022-07-09 DIAGNOSIS — Z95828 Presence of other vascular implants and grafts: Secondary | ICD-10-CM

## 2022-07-09 DIAGNOSIS — I252 Old myocardial infarction: Secondary | ICD-10-CM | POA: Insufficient documentation

## 2022-07-09 DIAGNOSIS — I2510 Atherosclerotic heart disease of native coronary artery without angina pectoris: Secondary | ICD-10-CM | POA: Diagnosis not present

## 2022-07-09 DIAGNOSIS — I700 Atherosclerosis of aorta: Secondary | ICD-10-CM | POA: Diagnosis not present

## 2022-07-09 LAB — CBC WITH DIFFERENTIAL (CANCER CENTER ONLY)
Abs Immature Granulocytes: 0.01 10*3/uL (ref 0.00–0.07)
Basophils Absolute: 0.1 10*3/uL (ref 0.0–0.1)
Basophils Relative: 1 %
Eosinophils Absolute: 0.1 10*3/uL (ref 0.0–0.5)
Eosinophils Relative: 2 %
HCT: 41.5 % (ref 39.0–52.0)
Hemoglobin: 14.7 g/dL (ref 13.0–17.0)
Immature Granulocytes: 0 %
Lymphocytes Relative: 28 %
Lymphs Abs: 1.1 10*3/uL (ref 0.7–4.0)
MCH: 32.5 pg (ref 26.0–34.0)
MCHC: 35.4 g/dL (ref 30.0–36.0)
MCV: 91.8 fL (ref 80.0–100.0)
Monocytes Absolute: 0.5 10*3/uL (ref 0.1–1.0)
Monocytes Relative: 13 %
Neutro Abs: 2.2 10*3/uL (ref 1.7–7.7)
Neutrophils Relative %: 56 %
Platelet Count: 176 10*3/uL (ref 150–400)
RBC: 4.52 MIL/uL (ref 4.22–5.81)
RDW: 13.1 % (ref 11.5–15.5)
WBC Count: 3.9 10*3/uL — ABNORMAL LOW (ref 4.0–10.5)
nRBC: 0 % (ref 0.0–0.2)

## 2022-07-09 LAB — CMP (CANCER CENTER ONLY)
ALT: 24 U/L (ref 0–44)
AST: 24 U/L (ref 15–41)
Albumin: 3.8 g/dL (ref 3.5–5.0)
Alkaline Phosphatase: 58 U/L (ref 38–126)
Anion gap: 3 — ABNORMAL LOW (ref 5–15)
BUN: 17 mg/dL (ref 8–23)
CO2: 30 mmol/L (ref 22–32)
Calcium: 9.5 mg/dL (ref 8.9–10.3)
Chloride: 103 mmol/L (ref 98–111)
Creatinine: 1.09 mg/dL (ref 0.61–1.24)
GFR, Estimated: >60 mL/min (ref >60)
Glucose, Bld: 132 mg/dL — ABNORMAL HIGH (ref 70–99)
Potassium: 3.9 mmol/L (ref 3.5–5.1)
Sodium: 136 mmol/L (ref 135–145)
Total Bilirubin: 0.9 mg/dL (ref 0.3–1.2)
Total Protein: 7 g/dL (ref 6.5–8.1)

## 2022-07-09 MED ORDER — SODIUM CHLORIDE 0.9% FLUSH
10.0000 mL | Freq: Once | INTRAVENOUS | Status: AC
  Administered 2022-07-09: 10 mL

## 2022-07-09 MED ORDER — HEPARIN SOD (PORK) LOCK FLUSH 100 UNIT/ML IV SOLN
500.0000 [IU] | Freq: Once | INTRAVENOUS | Status: AC
  Administered 2022-07-09: 500 [IU]

## 2022-07-09 NOTE — Progress Notes (Signed)
Hematology and Oncology Follow Up Visit  Gregory Cummings [id number] October 02, 1946 76 y.o. 07/09/2022 9:15 AM Pa, [facility], [name], MD   Principle Diagnosis: 76 year old man with bladder cancer diagnosed in 2020.  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     Prior Therapy:  TURBT on April 12, 2019 which confirmed the diagnosis.  Neoadjuvant chemotherapy utilizing gemcitabine and cisplatin started on 05/29/2019.  He is status post 4 cycles of chemotherapy completed July 31, 2019.  He is status post radical cystectomy and lymph node dissection completed on October 10, 2019.  The final pathology showed complete response with T0N0 disease.  Current therapy: Active surveillance.  Interim History: Mr. Hinderman is here for repeat evaluation.  Since the last visit, he reports no major changes in his health.  He denies any nausea, vomiting or abdominal pain.  He denies any urinary difficulties.  He remains active and continues to attend activities of daily living.                Medications: Updated on review. Current Outpatient Medications  Medication Sig Dispense Refill   acetaminophen (TYLENOL) 500 MG tablet Take 500 mg by mouth every 6 (six) hours as needed for mild pain, moderate pain or headache.     aspirin 81 MG tablet Take 81 mg by mouth daily.     carvedilol (COREG) 3.125 MG tablet Take 3.125 mg by mouth daily.     famotidine (PEPCID) 10 MG tablet Take 10 mg by mouth 2 (two) times daily as needed for heartburn or indigestion.      insulin glargine (LANTUS) 100 UNIT/ML injection Inject 0.2 mLs (20 Units total) into the skin daily. 10 mL 11   insulin regular (NOVOLIN R) 100 units/mL injection Inject 30-100 Units into the skin 3 (three) times daily before meals. Sliding Scale Depended on the glucose reading and meal plan for the next two  hours The patient has a Libre device on his arm     levofloxacin (LEVAQUIN) 750 MG tablet Take 1 tablet (750 mg total) by mouth daily. 7 tablet 0   lidocaine-prilocaine (EMLA) cream Apply 1 application topically as needed. 30 g 0   lisinopril (ZESTRIL) 5 MG tablet Take 1 tablet (5 mg total) by mouth daily. For future refills, please discuss with your PCP. 30 tablet 0   ondansetron (ZOFRAN) 8 MG tablet Take 8 mg by mouth 2 (two) times daily as needed for nausea or vomiting.      simvastatin (ZOCOR) 20 MG tablet Take 20 mg by mouth at bedtime.      No current facility-administered medications for this visit.     Allergies:  Allergies  Allergen Reactions   No Known Allergies       Physical Exam:   Blood pressure 101/63, pulse 64, temperature 97.9 F (36.6 C), temperature source Temporal, resp. rate 18, height '5\' 9"'$  (1.753 m), weight 216 lb 4.8 oz (98.1 kg), SpO2 99 %.     ECOG: 0   General appearance: Alert, awake without any distress. Head: Atraumatic without abnormalities Oropharynx: Without any thrush or ulcers. Eyes: No scleral icterus. Lymph nodes: No lymphadenopathy noted in the cervical, supraclavicular, or axillary nodes Heart:regular rate and rhythm, without any murmurs or gallops.   Lung: Clear to auscultation without any rhonchi, wheezes or dullness to percussion. Abdomin: Soft, nontender without any shifting dullness or ascites. Musculoskeletal: No clubbing or cyanosis.  Neurological: No motor or sensory deficits. Skin: No rashes or lesions.           Lab Results: Lab Results  Component Value Date   WBC 4.7 07/10/2021   HGB 13.8 07/10/2021   HCT 39.5 07/10/2021   MCV 89.0 07/10/2021   PLT 173 07/10/2021     Chemistry      Component Value Date/Time   NA 134 (L) 07/10/2021 0934   K 4.2 07/10/2021 0934   CL 101 07/10/2021 0934   CO2 27 07/10/2021 0934   BUN 16 07/10/2021 0934   CREATININE 1.04 07/10/2021 0934      Component Value Date/Time    CALCIUM 9.2 07/10/2021 0934   ALKPHOS 66 07/10/2021 0934   AST 24 07/10/2021 0934   ALT 24 07/10/2021 0934   BILITOT 0.8 07/10/2021 0934      IMPRESSION: 1. Stable examination demonstrating no findings to suggest recurrent or metastatic disease in the abdomen or pelvis. 2. Aortic atherosclerosis, in addition to at least three-vessel coronary artery disease. 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 Assessment for potential risk factor modification, dietary therapy or pharmacologic therapy may be warranted, if clinically indicated. 3. Additional incidental findings, as above.   Impression and Plan:  76 year old man with:   1.  Bladder cancer diagnosed in 2020.  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   He continues to be on active surveillance at this time without any evidence of relapsed disease.  Imaging studies obtained on May 19, 2022 including abdominal imaging as well as chest x-ray showed no disease relapse.  At this time I recommended continued active surveillance and use of salvage therapy upon relapse disease this would include immunotherapy versus oral targeted therapy as well as antibody drug conjugate.  He is agreeable to continue with active surveillance.    2.  Port-A-Cath management: Risks and benefits of a Port-A-Cath removal were discussed at this time.  He prefers to keep it for the time being.   3.  Follow-up: he will return in 2 months and in 4 months for Port-A-Cath flush and MD follow-up in 6 months  30  minutes were dedicated to this encounter.  Time spent on reviewing laboratory data, disease status update and outlining future plan of care discussion.  Zola Button, MD 7/14/20239:15 AM

## 2022-07-13 ENCOUNTER — Telehealth: Payer: Self-pay | Admitting: Oncology

## 2022-07-13 NOTE — Telephone Encounter (Signed)
Scheduled per los, patient has been called and notified. 

## 2022-07-14 DIAGNOSIS — E119 Type 2 diabetes mellitus without complications: Secondary | ICD-10-CM | POA: Diagnosis not present

## 2022-07-14 DIAGNOSIS — Z794 Long term (current) use of insulin: Secondary | ICD-10-CM | POA: Diagnosis not present

## 2022-07-19 ENCOUNTER — Other Ambulatory Visit: Payer: Self-pay

## 2022-07-20 ENCOUNTER — Other Ambulatory Visit: Payer: Self-pay

## 2022-07-27 ENCOUNTER — Other Ambulatory Visit: Payer: Self-pay

## 2022-07-27 DIAGNOSIS — Z936 Other artificial openings of urinary tract status: Secondary | ICD-10-CM | POA: Diagnosis not present

## 2022-07-27 DIAGNOSIS — R310 Gross hematuria: Secondary | ICD-10-CM | POA: Diagnosis not present

## 2022-07-27 DIAGNOSIS — C672 Malignant neoplasm of lateral wall of bladder: Secondary | ICD-10-CM | POA: Diagnosis not present

## 2022-07-28 ENCOUNTER — Other Ambulatory Visit: Payer: Self-pay

## 2022-08-02 DIAGNOSIS — E1169 Type 2 diabetes mellitus with other specified complication: Secondary | ICD-10-CM | POA: Diagnosis not present

## 2022-08-02 DIAGNOSIS — E119 Type 2 diabetes mellitus without complications: Secondary | ICD-10-CM | POA: Diagnosis not present

## 2022-08-02 DIAGNOSIS — Z936 Other artificial openings of urinary tract status: Secondary | ICD-10-CM | POA: Diagnosis not present

## 2022-08-02 DIAGNOSIS — Z8551 Personal history of malignant neoplasm of bladder: Secondary | ICD-10-CM | POA: Diagnosis not present

## 2022-08-02 DIAGNOSIS — Z794 Long term (current) use of insulin: Secondary | ICD-10-CM | POA: Diagnosis not present

## 2022-08-03 DIAGNOSIS — Z794 Long term (current) use of insulin: Secondary | ICD-10-CM | POA: Diagnosis not present

## 2022-08-03 DIAGNOSIS — E119 Type 2 diabetes mellitus without complications: Secondary | ICD-10-CM | POA: Diagnosis not present

## 2022-08-31 DIAGNOSIS — Z936 Other artificial openings of urinary tract status: Secondary | ICD-10-CM | POA: Diagnosis not present

## 2022-08-31 DIAGNOSIS — E1169 Type 2 diabetes mellitus with other specified complication: Secondary | ICD-10-CM | POA: Diagnosis not present

## 2022-08-31 DIAGNOSIS — E119 Type 2 diabetes mellitus without complications: Secondary | ICD-10-CM | POA: Diagnosis not present

## 2022-08-31 DIAGNOSIS — Z794 Long term (current) use of insulin: Secondary | ICD-10-CM | POA: Diagnosis not present

## 2022-08-31 DIAGNOSIS — Z8551 Personal history of malignant neoplasm of bladder: Secondary | ICD-10-CM | POA: Diagnosis not present

## 2022-09-10 ENCOUNTER — Inpatient Hospital Stay: Payer: Medicare Other | Attending: Oncology

## 2022-09-10 ENCOUNTER — Other Ambulatory Visit: Payer: Self-pay

## 2022-09-10 DIAGNOSIS — Z8551 Personal history of malignant neoplasm of bladder: Secondary | ICD-10-CM | POA: Insufficient documentation

## 2022-09-10 DIAGNOSIS — C670 Malignant neoplasm of trigone of bladder: Secondary | ICD-10-CM

## 2022-09-10 DIAGNOSIS — Z452 Encounter for adjustment and management of vascular access device: Secondary | ICD-10-CM | POA: Diagnosis not present

## 2022-09-10 DIAGNOSIS — Z95828 Presence of other vascular implants and grafts: Secondary | ICD-10-CM

## 2022-09-10 MED ORDER — HEPARIN SOD (PORK) LOCK FLUSH 100 UNIT/ML IV SOLN
500.0000 [IU] | Freq: Once | INTRAVENOUS | Status: AC
  Administered 2022-09-10: 500 [IU]

## 2022-09-10 MED ORDER — SODIUM CHLORIDE 0.9% FLUSH
10.0000 mL | Freq: Once | INTRAVENOUS | Status: AC
  Administered 2022-09-10: 10 mL

## 2022-10-04 DIAGNOSIS — Z794 Long term (current) use of insulin: Secondary | ICD-10-CM | POA: Diagnosis not present

## 2022-10-04 DIAGNOSIS — Z936 Other artificial openings of urinary tract status: Secondary | ICD-10-CM | POA: Diagnosis not present

## 2022-10-04 DIAGNOSIS — E1169 Type 2 diabetes mellitus with other specified complication: Secondary | ICD-10-CM | POA: Diagnosis not present

## 2022-10-04 DIAGNOSIS — E119 Type 2 diabetes mellitus without complications: Secondary | ICD-10-CM | POA: Diagnosis not present

## 2022-10-04 DIAGNOSIS — Z8551 Personal history of malignant neoplasm of bladder: Secondary | ICD-10-CM | POA: Diagnosis not present

## 2022-10-21 DIAGNOSIS — E1169 Type 2 diabetes mellitus with other specified complication: Secondary | ICD-10-CM | POA: Diagnosis not present

## 2022-10-21 DIAGNOSIS — I10 Essential (primary) hypertension: Secondary | ICD-10-CM | POA: Diagnosis not present

## 2022-10-21 DIAGNOSIS — E7800 Pure hypercholesterolemia, unspecified: Secondary | ICD-10-CM | POA: Diagnosis not present

## 2022-10-25 DIAGNOSIS — Z794 Long term (current) use of insulin: Secondary | ICD-10-CM | POA: Diagnosis not present

## 2022-10-25 DIAGNOSIS — E119 Type 2 diabetes mellitus without complications: Secondary | ICD-10-CM | POA: Diagnosis not present

## 2022-11-03 DIAGNOSIS — Z8551 Personal history of malignant neoplasm of bladder: Secondary | ICD-10-CM | POA: Diagnosis not present

## 2022-11-03 DIAGNOSIS — E119 Type 2 diabetes mellitus without complications: Secondary | ICD-10-CM | POA: Diagnosis not present

## 2022-11-03 DIAGNOSIS — Z936 Other artificial openings of urinary tract status: Secondary | ICD-10-CM | POA: Diagnosis not present

## 2022-11-03 DIAGNOSIS — Z794 Long term (current) use of insulin: Secondary | ICD-10-CM | POA: Diagnosis not present

## 2022-11-03 DIAGNOSIS — E1169 Type 2 diabetes mellitus with other specified complication: Secondary | ICD-10-CM | POA: Diagnosis not present

## 2022-11-12 ENCOUNTER — Inpatient Hospital Stay: Payer: Medicare Other | Attending: Oncology

## 2022-11-12 ENCOUNTER — Other Ambulatory Visit: Payer: Self-pay

## 2022-11-12 DIAGNOSIS — C670 Malignant neoplasm of trigone of bladder: Secondary | ICD-10-CM

## 2022-11-12 DIAGNOSIS — Z8551 Personal history of malignant neoplasm of bladder: Secondary | ICD-10-CM | POA: Insufficient documentation

## 2022-11-12 DIAGNOSIS — Z452 Encounter for adjustment and management of vascular access device: Secondary | ICD-10-CM | POA: Insufficient documentation

## 2022-11-12 DIAGNOSIS — Z95828 Presence of other vascular implants and grafts: Secondary | ICD-10-CM

## 2022-11-12 MED ORDER — HEPARIN SOD (PORK) LOCK FLUSH 100 UNIT/ML IV SOLN
500.0000 [IU] | Freq: Once | INTRAVENOUS | Status: AC
  Administered 2022-11-12: 500 [IU]

## 2022-11-12 MED ORDER — SODIUM CHLORIDE 0.9% FLUSH
10.0000 mL | Freq: Once | INTRAVENOUS | Status: AC
  Administered 2022-11-12: 10 mL

## 2022-12-01 DIAGNOSIS — E1169 Type 2 diabetes mellitus with other specified complication: Secondary | ICD-10-CM | POA: Diagnosis not present

## 2022-12-01 DIAGNOSIS — E119 Type 2 diabetes mellitus without complications: Secondary | ICD-10-CM | POA: Diagnosis not present

## 2022-12-01 DIAGNOSIS — Z936 Other artificial openings of urinary tract status: Secondary | ICD-10-CM | POA: Diagnosis not present

## 2022-12-01 DIAGNOSIS — Z8551 Personal history of malignant neoplasm of bladder: Secondary | ICD-10-CM | POA: Diagnosis not present

## 2022-12-01 DIAGNOSIS — Z794 Long term (current) use of insulin: Secondary | ICD-10-CM | POA: Diagnosis not present

## 2022-12-10 DIAGNOSIS — E1169 Type 2 diabetes mellitus with other specified complication: Secondary | ICD-10-CM | POA: Diagnosis not present

## 2022-12-10 DIAGNOSIS — I10 Essential (primary) hypertension: Secondary | ICD-10-CM | POA: Diagnosis not present

## 2022-12-24 DIAGNOSIS — E7800 Pure hypercholesterolemia, unspecified: Secondary | ICD-10-CM | POA: Diagnosis not present

## 2022-12-24 DIAGNOSIS — E1169 Type 2 diabetes mellitus with other specified complication: Secondary | ICD-10-CM | POA: Diagnosis not present

## 2022-12-24 DIAGNOSIS — I10 Essential (primary) hypertension: Secondary | ICD-10-CM | POA: Diagnosis not present

## 2023-01-07 ENCOUNTER — Inpatient Hospital Stay: Payer: Medicare Other | Attending: Oncology

## 2023-01-07 ENCOUNTER — Inpatient Hospital Stay (HOSPITAL_BASED_OUTPATIENT_CLINIC_OR_DEPARTMENT_OTHER): Payer: Medicare Other | Admitting: Oncology

## 2023-01-07 ENCOUNTER — Other Ambulatory Visit: Payer: Self-pay

## 2023-01-07 VITALS — BP 93/70 | HR 71 | Temp 97.8°F | Resp 17 | Ht 69.0 in | Wt 198.5 lb

## 2023-01-07 DIAGNOSIS — Z8551 Personal history of malignant neoplasm of bladder: Secondary | ICD-10-CM | POA: Diagnosis present

## 2023-01-07 DIAGNOSIS — Z95828 Presence of other vascular implants and grafts: Secondary | ICD-10-CM

## 2023-01-07 DIAGNOSIS — C670 Malignant neoplasm of trigone of bladder: Secondary | ICD-10-CM | POA: Diagnosis not present

## 2023-01-07 DIAGNOSIS — Z452 Encounter for adjustment and management of vascular access device: Secondary | ICD-10-CM | POA: Diagnosis present

## 2023-01-07 LAB — CMP (CANCER CENTER ONLY)
ALT: 18 U/L (ref 0–44)
AST: 19 U/L (ref 15–41)
Albumin: 3.8 g/dL (ref 3.5–5.0)
Alkaline Phosphatase: 58 U/L (ref 38–126)
Anion gap: 4 — ABNORMAL LOW (ref 5–15)
BUN: 23 mg/dL (ref 8–23)
CO2: 29 mmol/L (ref 22–32)
Calcium: 9.7 mg/dL (ref 8.9–10.3)
Chloride: 101 mmol/L (ref 98–111)
Creatinine: 0.99 mg/dL (ref 0.61–1.24)
GFR, Estimated: >60 mL/min (ref >60)
Glucose, Bld: 213 mg/dL — ABNORMAL HIGH (ref 70–99)
Potassium: 4.3 mmol/L (ref 3.5–5.1)
Sodium: 134 mmol/L — ABNORMAL LOW (ref 135–145)
Total Bilirubin: 1.2 mg/dL (ref 0.3–1.2)
Total Protein: 7.1 g/dL (ref 6.5–8.1)

## 2023-01-07 LAB — CBC WITH DIFFERENTIAL (CANCER CENTER ONLY)
Abs Immature Granulocytes: 0.01 10*3/uL (ref 0.00–0.07)
Basophils Absolute: 0.1 10*3/uL (ref 0.0–0.1)
Basophils Relative: 2 %
Eosinophils Absolute: 0.3 10*3/uL (ref 0.0–0.5)
Eosinophils Relative: 7 %
HCT: 40.6 % (ref 39.0–52.0)
Hemoglobin: 14.4 g/dL (ref 13.0–17.0)
Immature Granulocytes: 0 %
Lymphocytes Relative: 29 %
Lymphs Abs: 1.2 10*3/uL (ref 0.7–4.0)
MCH: 32.3 pg (ref 26.0–34.0)
MCHC: 35.5 g/dL (ref 30.0–36.0)
MCV: 91 fL (ref 80.0–100.0)
Monocytes Absolute: 0.5 10*3/uL (ref 0.1–1.0)
Monocytes Relative: 11 %
Neutro Abs: 2.1 10*3/uL (ref 1.7–7.7)
Neutrophils Relative %: 51 %
Platelet Count: 172 10*3/uL (ref 150–400)
RBC: 4.46 MIL/uL (ref 4.22–5.81)
RDW: 12.4 % (ref 11.5–15.5)
WBC Count: 4.1 10*3/uL (ref 4.0–10.5)
nRBC: 0 % (ref 0.0–0.2)

## 2023-01-07 MED ORDER — HEPARIN SOD (PORK) LOCK FLUSH 100 UNIT/ML IV SOLN
500.0000 [IU] | Freq: Once | INTRAVENOUS | Status: AC
  Administered 2023-01-07: 500 [IU]

## 2023-01-07 MED ORDER — SODIUM CHLORIDE 0.9% FLUSH
10.0000 mL | Freq: Once | INTRAVENOUS | Status: AC
  Administered 2023-01-07: 10 mL

## 2023-01-07 NOTE — Progress Notes (Signed)
Hematology and Oncology Follow Up Visit  Gregory Cummings [id number] July 24, 1946 77 y.o. 01/07/2023 9:38 AM [facility], [facility]   Principle Diagnosis: 77 year old man with 2N0 high-grade urothelial carcinoma of the bladder cancer diagnosed in 2020.  He was found to have 30% squamous cell differentiation and 10% glandular component at the time of diagnosis but achieved a complete response to neoadjuvant chemotherapy and radical cystectomy.   Prior Therapy:  TURBT on April 12, 2019 which confirmed the diagnosis.  Neoadjuvant chemotherapy utilizing gemcitabine and cisplatin started on 05/29/2019.  He is status post 4 cycles of chemotherapy completed July 31, 2019.  He is status post radical cystectomy and lymph node dissection completed on October 10, 2019.  The final pathology showed complete response with T0N0 disease.  Current therapy: Active surveillance.  Interim History: Gregory Cummings returns today for a follow-up.  Since last visit, he reports feeling well without any major complaints.  He denies any nausea, vomiting or abdominal pain.  He denies any abdominal distention or early satiety.  He denies any urinary complaints.  Formal status quality of life remains unchanged.  He remains on active surveillance under the care of [facility] with the periodic surveillance.               Medications: Updated on review. Current Outpatient Medications  Medication Sig Dispense Refill   acetaminophen (TYLENOL) 500 MG tablet Take 500 mg by mouth every 6 (six) hours as needed for mild pain, moderate pain or headache.     aspirin 81 MG tablet Take 81 mg by mouth daily.     carvedilol (COREG) 3.125 MG tablet Take 3.125 mg by mouth daily.     famotidine (PEPCID) 10 MG tablet Take 10 mg by mouth 2 (two) times daily as needed for heartburn or indigestion.      insulin glargine (LANTUS) 100 UNIT/ML injection Inject 0.2 mLs (20 Units total) into  the skin daily. 10 mL 11   insulin regular (NOVOLIN R) 100 units/mL injection Inject 30-100 Units into the skin 3 (three) times daily before meals. Sliding Scale Depended on the glucose reading and meal plan for the next two hours The patient has a Libre device on his arm     levofloxacin (LEVAQUIN) 750 MG tablet Take 1 tablet (750 mg total) by mouth daily. 7 tablet 0   lidocaine-prilocaine (EMLA) cream Apply 1 application topically as needed. 30 g 0   lisinopril (ZESTRIL) 5 MG tablet Take 1 tablet (5 mg total) by mouth daily. For future refills, please discuss with your PCP. 30 tablet 0   ondansetron (ZOFRAN) 8 MG tablet Take 8 mg by mouth 2 (two) times daily as needed for nausea or vomiting.      simvastatin (ZOCOR) 20 MG tablet Take 20 mg by mouth at bedtime.      No current facility-administered medications for this visit.     Allergies:  Allergies  Allergen Reactions   No Known Allergies       Physical Exam:   Blood pressure 93/70, pulse 71, temperature 97.8 F (36.6 C), temperature source Temporal, resp. rate 17, height '5\' 9"'$  (1.753 m), weight 198 lb 8 oz (90 kg), SpO2 99 %.     ECOG: 0   General appearance: Comfortable appearing without any discomfort Head: Normocephalic without any trauma Oropharynx: Mucous membranes are moist and pink without any thrush or ulcers. Eyes: Pupils are equal and round reactive to light. Lymph nodes: No cervical, supraclavicular,  inguinal or axillary lymphadenopathy.   Heart:regular rate and rhythm.  S1 and S2 without leg edema. Lung: Clear without any rhonchi or wheezes.  No dullness to percussion. Abdomin: Soft, nontender, nondistended with good bowel sounds.  No hepatosplenomegaly. Musculoskeletal: No joint deformity or effusion.  Full range of motion noted. Neurological: No deficits noted on motor, sensory and deep tendon reflex exam. Skin: No petechial rash or dryness.  Appeared moist.            Lab Results: Lab Results   Component Value Date   WBC 4.1 01/07/2023   HGB 14.4 01/07/2023   HCT 40.6 01/07/2023   MCV 91.0 01/07/2023   PLT 172 01/07/2023     Chemistry      Component Value Date/Time   NA 136 07/09/2022 1000   K 3.9 07/09/2022 1000   CL 103 07/09/2022 1000   CO2 30 07/09/2022 1000   BUN 17 07/09/2022 1000   CREATININE 1.09 07/09/2022 1000      Component Value Date/Time   CALCIUM 9.5 07/09/2022 1000   ALKPHOS 58 07/09/2022 1000   AST 24 07/09/2022 1000   ALT 24 07/09/2022 1000   BILITOT 0.9 07/09/2022 1000       Impression and Plan:  77 year old man with:   1.  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  He achieved a complete response to neoadjuvant chemotherapy after radical cystectomy.   The natural course of this disease was discussed and the risk of relapse was reviewed.  He continues to receive active surveillance under the care of [facility] with imaging studies as well as scoping.  Imaging studies and May 2023 did not show any evidence of metastatic disease.  At this time, I have recommended continued active surveillance and he prefers to continue to follow with medical oncology in addition to urology.  Salvage therapy options including immunotherapy combination with antibody drug conjugate Padcev would be his best option if he developed metastatic disease.  I recommended surveillance to complete 5 years and medical oncology surveillance could be discontinued.    2.  Port-A-Cath management: He opted to keep his Port-A-Cath at this time.  Risks and benefits of keeping it versus removing it were discussed today and for the time being based on his wishes will be flushed every 2 months.   3.  Follow-up: he will return in 2 months and in 4 months for Port-A-Cath flush and MD follow-up in 6 months  30  minutes were spent on this visit.  The time was dedicated to updating disease status, treatment  choices and outlining future plan of care review.  Zola Button, MD 1/12/20249:38 AM

## 2023-03-04 ENCOUNTER — Other Ambulatory Visit: Payer: Self-pay

## 2023-03-04 ENCOUNTER — Inpatient Hospital Stay: Payer: Medicare Other | Attending: Hematology

## 2023-03-04 DIAGNOSIS — Z8551 Personal history of malignant neoplasm of bladder: Secondary | ICD-10-CM | POA: Diagnosis present

## 2023-03-04 DIAGNOSIS — C670 Malignant neoplasm of trigone of bladder: Secondary | ICD-10-CM

## 2023-03-04 DIAGNOSIS — Z452 Encounter for adjustment and management of vascular access device: Secondary | ICD-10-CM | POA: Insufficient documentation

## 2023-03-04 DIAGNOSIS — Z95828 Presence of other vascular implants and grafts: Secondary | ICD-10-CM

## 2023-03-04 MED ORDER — SODIUM CHLORIDE 0.9% FLUSH
10.0000 mL | Freq: Once | INTRAVENOUS | Status: AC
  Administered 2023-03-04: 10 mL

## 2023-03-04 MED ORDER — HEPARIN SOD (PORK) LOCK FLUSH 100 UNIT/ML IV SOLN
500.0000 [IU] | Freq: Once | INTRAVENOUS | Status: AC
  Administered 2023-03-04: 500 [IU]

## 2023-03-09 ENCOUNTER — Other Ambulatory Visit: Payer: Self-pay

## 2023-03-09 DIAGNOSIS — C670 Malignant neoplasm of trigone of bladder: Secondary | ICD-10-CM

## 2023-05-03 DIAGNOSIS — Z794 Long term (current) use of insulin: Secondary | ICD-10-CM | POA: Diagnosis not present

## 2023-05-03 DIAGNOSIS — Z936 Other artificial openings of urinary tract status: Secondary | ICD-10-CM | POA: Diagnosis not present

## 2023-05-03 DIAGNOSIS — Z8551 Personal history of malignant neoplasm of bladder: Secondary | ICD-10-CM | POA: Diagnosis not present

## 2023-05-03 DIAGNOSIS — E1169 Type 2 diabetes mellitus with other specified complication: Secondary | ICD-10-CM | POA: Diagnosis not present

## 2023-05-03 DIAGNOSIS — E119 Type 2 diabetes mellitus without complications: Secondary | ICD-10-CM | POA: Diagnosis not present

## 2023-05-06 ENCOUNTER — Inpatient Hospital Stay: Payer: Medicare Other | Attending: Hematology

## 2023-05-06 ENCOUNTER — Other Ambulatory Visit: Payer: Self-pay

## 2023-05-06 DIAGNOSIS — Z95828 Presence of other vascular implants and grafts: Secondary | ICD-10-CM

## 2023-05-06 DIAGNOSIS — Z8551 Personal history of malignant neoplasm of bladder: Secondary | ICD-10-CM | POA: Diagnosis not present

## 2023-05-06 DIAGNOSIS — C670 Malignant neoplasm of trigone of bladder: Secondary | ICD-10-CM

## 2023-05-06 DIAGNOSIS — Z9221 Personal history of antineoplastic chemotherapy: Secondary | ICD-10-CM | POA: Diagnosis not present

## 2023-05-06 MED ORDER — SODIUM CHLORIDE 0.9% FLUSH
10.0000 mL | Freq: Once | INTRAVENOUS | Status: AC
  Administered 2023-05-06: 10 mL

## 2023-05-06 MED ORDER — HEPARIN SOD (PORK) LOCK FLUSH 100 UNIT/ML IV SOLN
500.0000 [IU] | Freq: Once | INTRAVENOUS | Status: AC
  Administered 2023-05-06: 500 [IU]

## 2023-06-02 DIAGNOSIS — E1169 Type 2 diabetes mellitus with other specified complication: Secondary | ICD-10-CM | POA: Diagnosis not present

## 2023-06-02 DIAGNOSIS — Z794 Long term (current) use of insulin: Secondary | ICD-10-CM | POA: Diagnosis not present

## 2023-06-02 DIAGNOSIS — Z8551 Personal history of malignant neoplasm of bladder: Secondary | ICD-10-CM | POA: Diagnosis not present

## 2023-06-02 DIAGNOSIS — Z936 Other artificial openings of urinary tract status: Secondary | ICD-10-CM | POA: Diagnosis not present

## 2023-06-02 DIAGNOSIS — E119 Type 2 diabetes mellitus without complications: Secondary | ICD-10-CM | POA: Diagnosis not present

## 2023-06-13 ENCOUNTER — Other Ambulatory Visit (HOSPITAL_COMMUNITY): Payer: Self-pay | Admitting: Urology

## 2023-06-13 ENCOUNTER — Ambulatory Visit (HOSPITAL_COMMUNITY)
Admission: RE | Admit: 2023-06-13 | Discharge: 2023-06-13 | Disposition: A | Discharge status: Home / Self Care | Payer: Medicare Other | Source: Ambulatory Visit | Attending: Urology | Admitting: Urology

## 2023-06-13 DIAGNOSIS — C679 Malignant neoplasm of bladder, unspecified: Secondary | ICD-10-CM | POA: Diagnosis not present

## 2023-06-13 DIAGNOSIS — C672 Malignant neoplasm of lateral wall of bladder: Secondary | ICD-10-CM | POA: Diagnosis not present

## 2023-06-20 DIAGNOSIS — C672 Malignant neoplasm of lateral wall of bladder: Secondary | ICD-10-CM | POA: Diagnosis not present

## 2023-06-20 DIAGNOSIS — K760 Fatty (change of) liver, not elsewhere classified: Secondary | ICD-10-CM | POA: Diagnosis not present

## 2023-06-20 DIAGNOSIS — C679 Malignant neoplasm of bladder, unspecified: Secondary | ICD-10-CM | POA: Diagnosis not present

## 2023-06-23 DIAGNOSIS — E119 Type 2 diabetes mellitus without complications: Secondary | ICD-10-CM | POA: Diagnosis not present

## 2023-06-23 DIAGNOSIS — H40013 Open angle with borderline findings, low risk, bilateral: Secondary | ICD-10-CM | POA: Diagnosis not present

## 2023-06-27 DIAGNOSIS — C672 Malignant neoplasm of lateral wall of bladder: Secondary | ICD-10-CM | POA: Diagnosis not present

## 2023-06-28 DIAGNOSIS — Z0000 Encounter for general adult medical examination without abnormal findings: Secondary | ICD-10-CM | POA: Diagnosis not present

## 2023-06-28 DIAGNOSIS — E7800 Pure hypercholesterolemia, unspecified: Secondary | ICD-10-CM | POA: Diagnosis not present

## 2023-06-28 DIAGNOSIS — I10 Essential (primary) hypertension: Secondary | ICD-10-CM | POA: Diagnosis not present

## 2023-06-28 DIAGNOSIS — Z8551 Personal history of malignant neoplasm of bladder: Secondary | ICD-10-CM | POA: Diagnosis not present

## 2023-06-28 DIAGNOSIS — I700 Atherosclerosis of aorta: Secondary | ICD-10-CM | POA: Diagnosis not present

## 2023-06-28 DIAGNOSIS — Z794 Long term (current) use of insulin: Secondary | ICD-10-CM | POA: Diagnosis not present

## 2023-06-28 DIAGNOSIS — Z936 Other artificial openings of urinary tract status: Secondary | ICD-10-CM | POA: Diagnosis not present

## 2023-06-28 DIAGNOSIS — E1165 Type 2 diabetes mellitus with hyperglycemia: Secondary | ICD-10-CM | POA: Diagnosis not present

## 2023-06-28 DIAGNOSIS — Z79899 Other long term (current) drug therapy: Secondary | ICD-10-CM | POA: Diagnosis not present

## 2023-07-04 DIAGNOSIS — E119 Type 2 diabetes mellitus without complications: Secondary | ICD-10-CM | POA: Diagnosis not present

## 2023-07-06 DIAGNOSIS — Z794 Long term (current) use of insulin: Secondary | ICD-10-CM | POA: Diagnosis not present

## 2023-07-06 DIAGNOSIS — Z936 Other artificial openings of urinary tract status: Secondary | ICD-10-CM | POA: Diagnosis not present

## 2023-07-06 DIAGNOSIS — E119 Type 2 diabetes mellitus without complications: Secondary | ICD-10-CM | POA: Diagnosis not present

## 2023-07-06 DIAGNOSIS — Z8551 Personal history of malignant neoplasm of bladder: Secondary | ICD-10-CM | POA: Diagnosis not present

## 2023-07-06 DIAGNOSIS — E1169 Type 2 diabetes mellitus with other specified complication: Secondary | ICD-10-CM | POA: Diagnosis not present

## 2023-07-08 ENCOUNTER — Inpatient Hospital Stay: Payer: Medicare Other | Attending: Hematology

## 2023-07-08 ENCOUNTER — Inpatient Hospital Stay: Payer: Medicare Other | Admitting: Hematology

## 2023-07-08 ENCOUNTER — Other Ambulatory Visit: Payer: Self-pay

## 2023-07-08 ENCOUNTER — Encounter: Payer: Self-pay | Admitting: Hematology

## 2023-07-08 VITALS — BP 121/74 | HR 64 | Temp 98.2°F | Resp 18 | Ht 69.0 in | Wt 188.7 lb

## 2023-07-08 DIAGNOSIS — Z452 Encounter for adjustment and management of vascular access device: Secondary | ICD-10-CM | POA: Insufficient documentation

## 2023-07-08 DIAGNOSIS — C670 Malignant neoplasm of trigone of bladder: Secondary | ICD-10-CM

## 2023-07-08 DIAGNOSIS — Z8551 Personal history of malignant neoplasm of bladder: Secondary | ICD-10-CM | POA: Diagnosis not present

## 2023-07-08 DIAGNOSIS — Z9221 Personal history of antineoplastic chemotherapy: Secondary | ICD-10-CM | POA: Diagnosis not present

## 2023-07-08 DIAGNOSIS — Z95828 Presence of other vascular implants and grafts: Secondary | ICD-10-CM

## 2023-07-08 LAB — CBC WITH DIFFERENTIAL (CANCER CENTER ONLY)
Abs Immature Granulocytes: 0.02 10*3/uL (ref 0.00–0.07)
Basophils Absolute: 0 10*3/uL (ref 0.0–0.1)
Basophils Relative: 1 %
Eosinophils Absolute: 0.2 10*3/uL (ref 0.0–0.5)
Eosinophils Relative: 4 %
HCT: 37.7 % — ABNORMAL LOW (ref 39.0–52.0)
Hemoglobin: 13.4 g/dL (ref 13.0–17.0)
Immature Granulocytes: 1 %
Lymphocytes Relative: 27 %
Lymphs Abs: 1.2 10*3/uL (ref 0.7–4.0)
MCH: 32.6 pg (ref 26.0–34.0)
MCHC: 35.5 g/dL (ref 30.0–36.0)
MCV: 91.7 fL (ref 80.0–100.0)
Monocytes Absolute: 0.5 10*3/uL (ref 0.1–1.0)
Monocytes Relative: 12 %
Neutro Abs: 2.5 10*3/uL (ref 1.7–7.7)
Neutrophils Relative %: 55 %
Platelet Count: 156 10*3/uL (ref 150–400)
RBC: 4.11 MIL/uL — ABNORMAL LOW (ref 4.22–5.81)
RDW: 13 % (ref 11.5–15.5)
WBC Count: 4.4 10*3/uL (ref 4.0–10.5)
nRBC: 0 % (ref 0.0–0.2)

## 2023-07-08 LAB — CMP (CANCER CENTER ONLY)
ALT: 18 U/L (ref 0–44)
AST: 18 U/L (ref 15–41)
Albumin: 3.8 g/dL (ref 3.5–5.0)
Alkaline Phosphatase: 67 U/L (ref 38–126)
Anion gap: 4 — ABNORMAL LOW (ref 5–15)
BUN: 21 mg/dL (ref 8–23)
CO2: 27 mmol/L (ref 22–32)
Calcium: 9.4 mg/dL (ref 8.9–10.3)
Chloride: 102 mmol/L (ref 98–111)
Creatinine: 1.02 mg/dL (ref 0.61–1.24)
GFR, Estimated: >60 mL/min (ref >60)
Glucose, Bld: 192 mg/dL — ABNORMAL HIGH (ref 70–99)
Potassium: 4.5 mmol/L (ref 3.5–5.1)
Sodium: 133 mmol/L — ABNORMAL LOW (ref 135–145)
Total Bilirubin: 0.9 mg/dL (ref 0.3–1.2)
Total Protein: 6.8 g/dL (ref 6.5–8.1)

## 2023-07-08 MED ORDER — SODIUM CHLORIDE 0.9% FLUSH
10.0000 mL | Freq: Once | INTRAVENOUS | Status: AC
  Administered 2023-07-08: 10 mL

## 2023-07-08 MED ORDER — HEPARIN SOD (PORK) LOCK FLUSH 100 UNIT/ML IV SOLN
500.0000 [IU] | Freq: Once | INTRAVENOUS | Status: AC
  Administered 2023-07-08: 500 [IU]
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
## 2023-07-08 NOTE — Assessment & Plan Note (Signed)
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  Status post neoadjuvant chemotherapy with 4 cycles of cisplatin and gemcitabine, and a cystectomy, he achieved complete response after chemo. -On surveillance

## 2023-07-29 ENCOUNTER — Encounter (HOSPITAL_COMMUNITY): Payer: Self-pay

## 2023-07-29 ENCOUNTER — Ambulatory Visit (HOSPITAL_COMMUNITY)
Admission: RE | Admit: 2023-07-29 | Discharge: 2023-07-29 | Disposition: A | Discharge status: Home / Self Care | Payer: Medicare Other | Source: Ambulatory Visit | Attending: Hematology | Admitting: Hematology

## 2023-07-29 DIAGNOSIS — C679 Malignant neoplasm of bladder, unspecified: Secondary | ICD-10-CM | POA: Diagnosis not present

## 2023-07-29 DIAGNOSIS — C670 Malignant neoplasm of trigone of bladder: Secondary | ICD-10-CM | POA: Diagnosis not present

## 2023-07-29 DIAGNOSIS — N281 Cyst of kidney, acquired: Secondary | ICD-10-CM | POA: Diagnosis not present

## 2023-07-29 MED ORDER — IOHEXOL 300 MG/ML  SOLN
100.0000 mL | Freq: Once | INTRAMUSCULAR | Status: AC | PRN
  Administered 2023-07-29: 100 mL via INTRAVENOUS

## 2023-07-31 DIAGNOSIS — R051 Acute cough: Secondary | ICD-10-CM | POA: Diagnosis not present

## 2023-07-31 DIAGNOSIS — R5383 Other fatigue: Secondary | ICD-10-CM | POA: Diagnosis not present

## 2023-07-31 DIAGNOSIS — U071 COVID-19: Secondary | ICD-10-CM | POA: Diagnosis not present

## 2023-07-31 DIAGNOSIS — R070 Pain in throat: Secondary | ICD-10-CM | POA: Diagnosis not present

## 2023-08-03 DIAGNOSIS — Z794 Long term (current) use of insulin: Secondary | ICD-10-CM | POA: Diagnosis not present

## 2023-08-03 DIAGNOSIS — E119 Type 2 diabetes mellitus without complications: Secondary | ICD-10-CM | POA: Diagnosis not present

## 2023-08-03 DIAGNOSIS — Z8551 Personal history of malignant neoplasm of bladder: Secondary | ICD-10-CM | POA: Diagnosis not present

## 2023-08-03 DIAGNOSIS — Z936 Other artificial openings of urinary tract status: Secondary | ICD-10-CM | POA: Diagnosis not present

## 2023-08-03 DIAGNOSIS — E1169 Type 2 diabetes mellitus with other specified complication: Secondary | ICD-10-CM | POA: Diagnosis not present

## 2023-08-08 ENCOUNTER — Telehealth: Payer: Self-pay

## 2023-08-08 DIAGNOSIS — E119 Type 2 diabetes mellitus without complications: Secondary | ICD-10-CM | POA: Diagnosis not present

## 2023-08-08 DIAGNOSIS — Z936 Other artificial openings of urinary tract status: Secondary | ICD-10-CM | POA: Diagnosis not present

## 2023-08-08 DIAGNOSIS — E1169 Type 2 diabetes mellitus with other specified complication: Secondary | ICD-10-CM | POA: Diagnosis not present

## 2023-08-08 DIAGNOSIS — Z8551 Personal history of malignant neoplasm of bladder: Secondary | ICD-10-CM | POA: Diagnosis not present

## 2023-08-08 DIAGNOSIS — Z794 Long term (current) use of insulin: Secondary | ICD-10-CM | POA: Diagnosis not present

## 2023-08-08 NOTE — Telephone Encounter (Addendum)
Called patient to relay message below as per Dr. Mosetta Putt. Patient voiced full understanding.   ----- Message from [name] sent at 08/07/2023  6:34 PM EDT ----- Please let pt know his CT result, no cancer recurrence, good news. Thanks   [name]

## 2023-09-20 DIAGNOSIS — Z436 Encounter for attention to other artificial openings of urinary tract: Secondary | ICD-10-CM | POA: Diagnosis not present

## 2023-09-26 DIAGNOSIS — I10 Essential (primary) hypertension: Secondary | ICD-10-CM | POA: Diagnosis not present

## 2023-09-26 DIAGNOSIS — E1165 Type 2 diabetes mellitus with hyperglycemia: Secondary | ICD-10-CM | POA: Diagnosis not present

## 2023-09-26 DIAGNOSIS — Z23 Encounter for immunization: Secondary | ICD-10-CM | POA: Diagnosis not present

## 2023-09-29 DIAGNOSIS — E119 Type 2 diabetes mellitus without complications: Secondary | ICD-10-CM | POA: Diagnosis not present

## 2023-10-17 DIAGNOSIS — Z436 Encounter for attention to other artificial openings of urinary tract: Secondary | ICD-10-CM | POA: Diagnosis not present

## 2023-10-24 DIAGNOSIS — E1165 Type 2 diabetes mellitus with hyperglycemia: Secondary | ICD-10-CM | POA: Diagnosis not present

## 2023-10-24 DIAGNOSIS — E11649 Type 2 diabetes mellitus with hypoglycemia without coma: Secondary | ICD-10-CM | POA: Diagnosis not present

## 2023-10-24 DIAGNOSIS — I2510 Atherosclerotic heart disease of native coronary artery without angina pectoris: Secondary | ICD-10-CM | POA: Diagnosis not present

## 2023-10-24 DIAGNOSIS — R809 Proteinuria, unspecified: Secondary | ICD-10-CM | POA: Diagnosis not present

## 2023-10-24 DIAGNOSIS — R634 Abnormal weight loss: Secondary | ICD-10-CM | POA: Diagnosis not present

## 2023-10-24 DIAGNOSIS — I10 Essential (primary) hypertension: Secondary | ICD-10-CM | POA: Diagnosis not present

## 2023-10-24 DIAGNOSIS — E7800 Pure hypercholesterolemia, unspecified: Secondary | ICD-10-CM | POA: Diagnosis not present

## 2023-11-15 DIAGNOSIS — Z436 Encounter for attention to other artificial openings of urinary tract: Secondary | ICD-10-CM | POA: Diagnosis not present

## 2023-11-18 DIAGNOSIS — E1165 Type 2 diabetes mellitus with hyperglycemia: Secondary | ICD-10-CM | POA: Diagnosis not present

## 2023-11-18 DIAGNOSIS — I2510 Atherosclerotic heart disease of native coronary artery without angina pectoris: Secondary | ICD-10-CM | POA: Diagnosis not present

## 2023-11-18 DIAGNOSIS — R634 Abnormal weight loss: Secondary | ICD-10-CM | POA: Diagnosis not present

## 2023-11-18 DIAGNOSIS — R809 Proteinuria, unspecified: Secondary | ICD-10-CM | POA: Diagnosis not present

## 2023-11-18 DIAGNOSIS — I10 Essential (primary) hypertension: Secondary | ICD-10-CM | POA: Diagnosis not present

## 2023-11-23 DIAGNOSIS — E1165 Type 2 diabetes mellitus with hyperglycemia: Secondary | ICD-10-CM | POA: Diagnosis not present

## 2023-11-23 DIAGNOSIS — E119 Type 2 diabetes mellitus without complications: Secondary | ICD-10-CM | POA: Diagnosis not present

## 2023-12-13 DIAGNOSIS — Z436 Encounter for attention to other artificial openings of urinary tract: Secondary | ICD-10-CM | POA: Diagnosis not present

## 2023-12-27 DIAGNOSIS — I10 Essential (primary) hypertension: Secondary | ICD-10-CM | POA: Diagnosis not present

## 2023-12-27 DIAGNOSIS — E11649 Type 2 diabetes mellitus with hypoglycemia without coma: Secondary | ICD-10-CM | POA: Diagnosis not present

## 2023-12-27 DIAGNOSIS — R809 Proteinuria, unspecified: Secondary | ICD-10-CM | POA: Diagnosis not present

## 2023-12-27 DIAGNOSIS — I2510 Atherosclerotic heart disease of native coronary artery without angina pectoris: Secondary | ICD-10-CM | POA: Diagnosis not present

## 2023-12-27 DIAGNOSIS — E1165 Type 2 diabetes mellitus with hyperglycemia: Secondary | ICD-10-CM | POA: Diagnosis not present

## 2024-01-05 ENCOUNTER — Other Ambulatory Visit: Payer: Self-pay

## 2024-01-05 ENCOUNTER — Encounter: Payer: Self-pay | Admitting: Hematology

## 2024-01-05 ENCOUNTER — Inpatient Hospital Stay: Payer: Medicare Other | Attending: Hematology

## 2024-01-05 ENCOUNTER — Inpatient Hospital Stay: Payer: Medicare Other | Admitting: Hematology

## 2024-01-05 VITALS — BP 105/75 | HR 73 | Temp 98.0°F | Resp 18 | Wt 174.5 lb

## 2024-01-05 DIAGNOSIS — E119 Type 2 diabetes mellitus without complications: Secondary | ICD-10-CM | POA: Insufficient documentation

## 2024-01-05 DIAGNOSIS — Z794 Long term (current) use of insulin: Secondary | ICD-10-CM | POA: Insufficient documentation

## 2024-01-05 DIAGNOSIS — C670 Malignant neoplasm of trigone of bladder: Secondary | ICD-10-CM

## 2024-01-05 DIAGNOSIS — Z452 Encounter for adjustment and management of vascular access device: Secondary | ICD-10-CM | POA: Diagnosis not present

## 2024-01-05 DIAGNOSIS — I252 Old myocardial infarction: Secondary | ICD-10-CM | POA: Insufficient documentation

## 2024-01-05 DIAGNOSIS — Z7982 Long term (current) use of aspirin: Secondary | ICD-10-CM | POA: Diagnosis not present

## 2024-01-05 DIAGNOSIS — Z8551 Personal history of malignant neoplasm of bladder: Secondary | ICD-10-CM | POA: Insufficient documentation

## 2024-01-05 DIAGNOSIS — I10 Essential (primary) hypertension: Secondary | ICD-10-CM | POA: Insufficient documentation

## 2024-01-05 DIAGNOSIS — Z79899 Other long term (current) drug therapy: Secondary | ICD-10-CM | POA: Insufficient documentation

## 2024-01-05 DIAGNOSIS — Z9221 Personal history of antineoplastic chemotherapy: Secondary | ICD-10-CM | POA: Diagnosis not present

## 2024-01-05 LAB — CBC WITH DIFFERENTIAL (CANCER CENTER ONLY)
Abs Immature Granulocytes: 0.01 10*3/uL (ref 0.00–0.07)
Basophils Absolute: 0 10*3/uL (ref 0.0–0.1)
Basophils Relative: 1 %
Eosinophils Absolute: 0.2 10*3/uL (ref 0.0–0.5)
Eosinophils Relative: 6 %
HCT: 37.7 % — ABNORMAL LOW (ref 39.0–52.0)
Hemoglobin: 13.3 g/dL (ref 13.0–17.0)
Immature Granulocytes: 0 %
Lymphocytes Relative: 32 %
Lymphs Abs: 1.2 10*3/uL (ref 0.7–4.0)
MCH: 31.6 pg (ref 26.0–34.0)
MCHC: 35.3 g/dL (ref 30.0–36.0)
MCV: 89.5 fL (ref 80.0–100.0)
Monocytes Absolute: 0.5 10*3/uL (ref 0.1–1.0)
Monocytes Relative: 12 %
Neutro Abs: 1.8 10*3/uL (ref 1.7–7.7)
Neutrophils Relative %: 49 %
Platelet Count: 165 10*3/uL (ref 150–400)
RBC: 4.21 MIL/uL — ABNORMAL LOW (ref 4.22–5.81)
RDW: 13.7 % (ref 11.5–15.5)
WBC Count: 3.7 10*3/uL — ABNORMAL LOW (ref 4.0–10.5)
nRBC: 0 % (ref 0.0–0.2)

## 2024-01-05 LAB — CMP (CANCER CENTER ONLY)
ALT: 19 U/L (ref 0–44)
AST: 21 U/L (ref 15–41)
Albumin: 3.9 g/dL (ref 3.5–5.0)
Alkaline Phosphatase: 59 U/L (ref 38–126)
Anion gap: 3 — ABNORMAL LOW (ref 5–15)
BUN: 17 mg/dL (ref 8–23)
CO2: 31 mmol/L (ref 22–32)
Calcium: 9.8 mg/dL (ref 8.9–10.3)
Chloride: 102 mmol/L (ref 98–111)
Creatinine: 1.01 mg/dL (ref 0.61–1.24)
GFR, Estimated: >60 mL/min (ref >60)
Glucose, Bld: 72 mg/dL (ref 70–99)
Potassium: 4.3 mmol/L (ref 3.5–5.1)
Sodium: 136 mmol/L (ref 135–145)
Total Bilirubin: 1.2 mg/dL (ref 0.0–1.2)
Total Protein: 6.9 g/dL (ref 6.5–8.1)

## 2024-01-05 MED ORDER — LIDOCAINE-PRILOCAINE 2.5-2.5 % EX CREA: 1.0000 | TOPICAL_CREAM | CUTANEOUS | 0 refills | Status: AC | PRN

## 2024-01-05 NOTE — Assessment & Plan Note (Signed)
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  Status post neoadjuvant chemotherapy with 4 cycles of cisplatin  and gemcitabine , and a cystectomy, he achieved complete response after chemo. -On surveillance, last CT in 07/2023 was negative  -Patient still has a port, will continue to flush every 2 months. He declined port removal.
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
## 2024-01-09 ENCOUNTER — Inpatient Hospital Stay: Payer: Medicare Other

## 2024-01-09 DIAGNOSIS — Z9221 Personal history of antineoplastic chemotherapy: Secondary | ICD-10-CM | POA: Diagnosis not present

## 2024-01-09 DIAGNOSIS — Z8551 Personal history of malignant neoplasm of bladder: Secondary | ICD-10-CM | POA: Diagnosis not present

## 2024-01-09 DIAGNOSIS — E119 Type 2 diabetes mellitus without complications: Secondary | ICD-10-CM | POA: Diagnosis not present

## 2024-01-09 DIAGNOSIS — Z79899 Other long term (current) drug therapy: Secondary | ICD-10-CM | POA: Diagnosis not present

## 2024-01-09 DIAGNOSIS — Z7982 Long term (current) use of aspirin: Secondary | ICD-10-CM | POA: Diagnosis not present

## 2024-01-09 DIAGNOSIS — Z794 Long term (current) use of insulin: Secondary | ICD-10-CM | POA: Diagnosis not present

## 2024-01-09 DIAGNOSIS — C670 Malignant neoplasm of trigone of bladder: Secondary | ICD-10-CM

## 2024-01-09 DIAGNOSIS — I252 Old myocardial infarction: Secondary | ICD-10-CM | POA: Diagnosis not present

## 2024-01-09 DIAGNOSIS — Z436 Encounter for attention to other artificial openings of urinary tract: Secondary | ICD-10-CM | POA: Diagnosis not present

## 2024-01-09 DIAGNOSIS — I10 Essential (primary) hypertension: Secondary | ICD-10-CM | POA: Diagnosis not present

## 2024-01-09 DIAGNOSIS — Z452 Encounter for adjustment and management of vascular access device: Secondary | ICD-10-CM | POA: Diagnosis not present

## 2024-01-09 DIAGNOSIS — Z95828 Presence of other vascular implants and grafts: Secondary | ICD-10-CM

## 2024-01-09 MED ORDER — SODIUM CHLORIDE 0.9% FLUSH
10.0000 mL | Freq: Once | INTRAVENOUS | Status: AC
  Administered 2024-01-09: 10 mL

## 2024-01-09 MED ORDER — HEPARIN SOD (PORK) LOCK FLUSH 100 UNIT/ML IV SOLN
500.0000 [IU] | Freq: Once | INTRAVENOUS | Status: AC
  Administered 2024-01-09: 500 [IU]

## 2024-02-07 DIAGNOSIS — Z436 Encounter for attention to other artificial openings of urinary tract: Secondary | ICD-10-CM | POA: Diagnosis not present

## 2024-02-09 DIAGNOSIS — E1165 Type 2 diabetes mellitus with hyperglycemia: Secondary | ICD-10-CM | POA: Diagnosis not present

## 2024-02-27 DIAGNOSIS — I2510 Atherosclerotic heart disease of native coronary artery without angina pectoris: Secondary | ICD-10-CM | POA: Diagnosis not present

## 2024-02-27 DIAGNOSIS — E1169 Type 2 diabetes mellitus with other specified complication: Secondary | ICD-10-CM | POA: Diagnosis not present

## 2024-02-27 DIAGNOSIS — E789 Disorder of lipoprotein metabolism, unspecified: Secondary | ICD-10-CM | POA: Diagnosis not present

## 2024-02-27 DIAGNOSIS — I10 Essential (primary) hypertension: Secondary | ICD-10-CM | POA: Diagnosis not present

## 2024-03-02 ENCOUNTER — Inpatient Hospital Stay: Payer: Medicare Other | Attending: Hematology

## 2024-03-02 DIAGNOSIS — C670 Malignant neoplasm of trigone of bladder: Secondary | ICD-10-CM

## 2024-03-02 DIAGNOSIS — Z8551 Personal history of malignant neoplasm of bladder: Secondary | ICD-10-CM | POA: Diagnosis not present

## 2024-03-02 DIAGNOSIS — Z95828 Presence of other vascular implants and grafts: Secondary | ICD-10-CM

## 2024-03-02 DIAGNOSIS — Z452 Encounter for adjustment and management of vascular access device: Secondary | ICD-10-CM | POA: Insufficient documentation

## 2024-03-02 MED ORDER — SODIUM CHLORIDE 0.9% FLUSH
10.0000 mL | Freq: Once | INTRAVENOUS | Status: AC
  Administered 2024-03-02: 10 mL

## 2024-03-02 MED ORDER — HEPARIN SOD (PORK) LOCK FLUSH 100 UNIT/ML IV SOLN
500.0000 [IU] | Freq: Once | INTRAVENOUS | Status: AC
  Administered 2024-03-02: 500 [IU]

## 2024-03-05 DIAGNOSIS — Z436 Encounter for attention to other artificial openings of urinary tract: Secondary | ICD-10-CM | POA: Diagnosis not present

## 2024-03-26 DIAGNOSIS — I2510 Atherosclerotic heart disease of native coronary artery without angina pectoris: Secondary | ICD-10-CM | POA: Diagnosis not present

## 2024-03-26 DIAGNOSIS — I10 Essential (primary) hypertension: Secondary | ICD-10-CM | POA: Diagnosis not present

## 2024-03-26 DIAGNOSIS — E11649 Type 2 diabetes mellitus with hypoglycemia without coma: Secondary | ICD-10-CM | POA: Diagnosis not present

## 2024-03-26 DIAGNOSIS — R809 Proteinuria, unspecified: Secondary | ICD-10-CM | POA: Diagnosis not present

## 2024-03-26 DIAGNOSIS — E7800 Pure hypercholesterolemia, unspecified: Secondary | ICD-10-CM | POA: Diagnosis not present

## 2024-03-26 DIAGNOSIS — E1165 Type 2 diabetes mellitus with hyperglycemia: Secondary | ICD-10-CM | POA: Diagnosis not present

## 2024-03-26 DIAGNOSIS — E559 Vitamin D deficiency, unspecified: Secondary | ICD-10-CM | POA: Diagnosis not present

## 2024-04-02 DIAGNOSIS — Z436 Encounter for attention to other artificial openings of urinary tract: Secondary | ICD-10-CM | POA: Diagnosis not present

## 2024-04-27 ENCOUNTER — Inpatient Hospital Stay: Payer: Medicare Other | Attending: Hematology

## 2024-04-27 VITALS — BP 105/63 | HR 78 | Temp 98.2°F | Resp 18

## 2024-04-27 DIAGNOSIS — Z452 Encounter for adjustment and management of vascular access device: Secondary | ICD-10-CM | POA: Diagnosis not present

## 2024-04-27 DIAGNOSIS — C670 Malignant neoplasm of trigone of bladder: Secondary | ICD-10-CM

## 2024-04-27 DIAGNOSIS — Z95828 Presence of other vascular implants and grafts: Secondary | ICD-10-CM

## 2024-04-27 DIAGNOSIS — Z8551 Personal history of malignant neoplasm of bladder: Secondary | ICD-10-CM | POA: Insufficient documentation

## 2024-04-27 MED ORDER — HEPARIN SOD (PORK) LOCK FLUSH 100 UNIT/ML IV SOLN
500.0000 [IU] | Freq: Once | INTRAVENOUS | Status: AC
  Administered 2024-04-27: 500 [IU]

## 2024-04-27 MED ORDER — SODIUM CHLORIDE 0.9% FLUSH
10.0000 mL | Freq: Once | INTRAVENOUS | Status: AC
  Administered 2024-04-27: 10 mL

## 2024-05-01 DIAGNOSIS — Z436 Encounter for attention to other artificial openings of urinary tract: Secondary | ICD-10-CM | POA: Diagnosis not present

## 2024-05-07 DIAGNOSIS — E119 Type 2 diabetes mellitus without complications: Secondary | ICD-10-CM | POA: Diagnosis not present

## 2024-05-29 DIAGNOSIS — Z436 Encounter for attention to other artificial openings of urinary tract: Secondary | ICD-10-CM | POA: Diagnosis not present

## 2024-06-05 ENCOUNTER — Ambulatory Visit (HOSPITAL_COMMUNITY)
Admission: RE | Admit: 2024-06-05 | Discharge: 2024-06-05 | Disposition: A | Discharge status: Home / Self Care | Source: Ambulatory Visit | Attending: Urology | Admitting: Urology

## 2024-06-05 ENCOUNTER — Other Ambulatory Visit (HOSPITAL_COMMUNITY): Payer: Self-pay | Admitting: Urology

## 2024-06-05 DIAGNOSIS — C672 Malignant neoplasm of lateral wall of bladder: Secondary | ICD-10-CM | POA: Insufficient documentation

## 2024-06-05 DIAGNOSIS — C679 Malignant neoplasm of bladder, unspecified: Secondary | ICD-10-CM | POA: Diagnosis not present

## 2024-06-06 DIAGNOSIS — E119 Type 2 diabetes mellitus without complications: Secondary | ICD-10-CM | POA: Diagnosis not present

## 2024-06-12 DIAGNOSIS — C679 Malignant neoplasm of bladder, unspecified: Secondary | ICD-10-CM | POA: Diagnosis not present

## 2024-06-12 DIAGNOSIS — N281 Cyst of kidney, acquired: Secondary | ICD-10-CM | POA: Diagnosis not present

## 2024-06-12 DIAGNOSIS — C672 Malignant neoplasm of lateral wall of bladder: Secondary | ICD-10-CM | POA: Diagnosis not present

## 2024-06-12 DIAGNOSIS — N289 Disorder of kidney and ureter, unspecified: Secondary | ICD-10-CM | POA: Diagnosis not present

## 2024-06-21 ENCOUNTER — Other Ambulatory Visit: Payer: Self-pay

## 2024-06-21 DIAGNOSIS — C670 Malignant neoplasm of trigone of bladder: Secondary | ICD-10-CM

## 2024-06-22 ENCOUNTER — Inpatient Hospital Stay: Payer: Medicare Other | Attending: Hematology

## 2024-06-22 VITALS — BP 116/68 | HR 86 | Temp 98.4°F | Resp 16

## 2024-06-22 DIAGNOSIS — Z452 Encounter for adjustment and management of vascular access device: Secondary | ICD-10-CM | POA: Diagnosis not present

## 2024-06-22 DIAGNOSIS — Z95828 Presence of other vascular implants and grafts: Secondary | ICD-10-CM

## 2024-06-22 DIAGNOSIS — Z8551 Personal history of malignant neoplasm of bladder: Secondary | ICD-10-CM | POA: Diagnosis not present

## 2024-06-22 DIAGNOSIS — C670 Malignant neoplasm of trigone of bladder: Secondary | ICD-10-CM

## 2024-06-22 MED ORDER — HEPARIN SOD (PORK) LOCK FLUSH 100 UNIT/ML IV SOLN
500.0000 [IU] | Freq: Once | INTRAVENOUS | Status: AC
  Administered 2024-06-22: 500 [IU]

## 2024-06-22 MED ORDER — SODIUM CHLORIDE 0.9% FLUSH
10.0000 mL | Freq: Once | INTRAVENOUS | Status: AC
  Administered 2024-06-22: 10 mL

## 2024-06-25 DIAGNOSIS — E11649 Type 2 diabetes mellitus with hypoglycemia without coma: Secondary | ICD-10-CM | POA: Diagnosis not present

## 2024-06-25 DIAGNOSIS — I10 Essential (primary) hypertension: Secondary | ICD-10-CM | POA: Diagnosis not present

## 2024-06-25 DIAGNOSIS — Z436 Encounter for attention to other artificial openings of urinary tract: Secondary | ICD-10-CM | POA: Diagnosis not present

## 2024-06-25 DIAGNOSIS — R809 Proteinuria, unspecified: Secondary | ICD-10-CM | POA: Diagnosis not present

## 2024-06-25 DIAGNOSIS — I700 Atherosclerosis of aorta: Secondary | ICD-10-CM | POA: Diagnosis not present

## 2024-06-25 DIAGNOSIS — E1165 Type 2 diabetes mellitus with hyperglycemia: Secondary | ICD-10-CM | POA: Diagnosis not present

## 2024-06-25 DIAGNOSIS — I2510 Atherosclerotic heart disease of native coronary artery without angina pectoris: Secondary | ICD-10-CM | POA: Diagnosis not present

## 2024-06-25 DIAGNOSIS — E7800 Pure hypercholesterolemia, unspecified: Secondary | ICD-10-CM | POA: Diagnosis not present

## 2024-06-26 DIAGNOSIS — C672 Malignant neoplasm of lateral wall of bladder: Secondary | ICD-10-CM | POA: Diagnosis not present

## 2024-06-26 DIAGNOSIS — Z936 Other artificial openings of urinary tract status: Secondary | ICD-10-CM | POA: Diagnosis not present

## 2024-07-02 DIAGNOSIS — E119 Type 2 diabetes mellitus without complications: Secondary | ICD-10-CM | POA: Diagnosis not present

## 2024-07-02 DIAGNOSIS — H04123 Dry eye syndrome of bilateral lacrimal glands: Secondary | ICD-10-CM | POA: Diagnosis not present

## 2024-07-02 DIAGNOSIS — H43813 Vitreous degeneration, bilateral: Secondary | ICD-10-CM | POA: Diagnosis not present

## 2024-07-20 DIAGNOSIS — Z0000 Encounter for general adult medical examination without abnormal findings: Secondary | ICD-10-CM | POA: Diagnosis not present

## 2024-07-20 DIAGNOSIS — R2681 Unsteadiness on feet: Secondary | ICD-10-CM | POA: Diagnosis not present

## 2024-07-20 DIAGNOSIS — I2510 Atherosclerotic heart disease of native coronary artery without angina pectoris: Secondary | ICD-10-CM | POA: Diagnosis not present

## 2024-07-20 DIAGNOSIS — Z936 Other artificial openings of urinary tract status: Secondary | ICD-10-CM | POA: Diagnosis not present

## 2024-07-20 DIAGNOSIS — B351 Tinea unguium: Secondary | ICD-10-CM | POA: Diagnosis not present

## 2024-07-20 DIAGNOSIS — E7800 Pure hypercholesterolemia, unspecified: Secondary | ICD-10-CM | POA: Diagnosis not present

## 2024-07-20 DIAGNOSIS — H9190 Unspecified hearing loss, unspecified ear: Secondary | ICD-10-CM | POA: Diagnosis not present

## 2024-07-20 DIAGNOSIS — E1165 Type 2 diabetes mellitus with hyperglycemia: Secondary | ICD-10-CM | POA: Diagnosis not present

## 2024-07-20 DIAGNOSIS — Z794 Long term (current) use of insulin: Secondary | ICD-10-CM | POA: Diagnosis not present

## 2024-07-20 DIAGNOSIS — Z79899 Other long term (current) drug therapy: Secondary | ICD-10-CM | POA: Diagnosis not present

## 2024-07-20 DIAGNOSIS — Z8551 Personal history of malignant neoplasm of bladder: Secondary | ICD-10-CM | POA: Diagnosis not present

## 2024-07-24 ENCOUNTER — Encounter (INDEPENDENT_AMBULATORY_CARE_PROVIDER_SITE_OTHER): Payer: Self-pay

## 2024-07-24 DIAGNOSIS — Z436 Encounter for attention to other artificial openings of urinary tract: Secondary | ICD-10-CM | POA: Diagnosis not present

## 2024-07-27 ENCOUNTER — Encounter: Payer: Self-pay | Admitting: Podiatry

## 2024-07-27 ENCOUNTER — Ambulatory Visit: Admitting: Podiatry

## 2024-07-27 DIAGNOSIS — L600 Ingrowing nail: Secondary | ICD-10-CM

## 2024-07-27 DIAGNOSIS — M79671 Pain in right foot: Secondary | ICD-10-CM | POA: Diagnosis not present

## 2024-07-27 DIAGNOSIS — M79672 Pain in left foot: Secondary | ICD-10-CM | POA: Diagnosis not present

## 2024-07-27 DIAGNOSIS — B351 Tinea unguium: Secondary | ICD-10-CM

## 2024-07-27 NOTE — Progress Notes (Signed)
 Patient presents for evaluation and treatment of tenderness and some redness around nails feet.  Tenderness around toes with walking and wearing shoes.  Physical exam:  General appearance: Alert, pleasant, and in no acute distress.  Vascular: Pedal pulses: DP 2/4 B/L, PT 1/4 B/L. mild edema lower legs bilaterally  Neurological:  Light touch intact.  Normal Achilles tendon reflex.  Feet are very sensitive to touch.  Dermatologic:  Nails thickened, disfigured, discolored 1-5 BL with subungual debris.  Redness and hypertrophic nail folds along nail folds bilaterally but no signs of drainage or infection.  Musculoskeletal:  Hammertoes 2 through 5 bilaterally.  Mild HAV deformity bilaterally normal muscle strength lower extremity.   Diagnosis: 1. Painful onychomycotic nails 1 through 5 bilaterally. 2. Pain toes 1 through 5 bilaterally. 3.  Ingrown nails bilaterally  Plan: -New patient office visit for evaluation and management level 3.  Modifier 25. - Discussed with him the nails recommended periodic debridement if some of the nails become more problematic and start getting worse and more ingrown matrixectomy's could be considered.  Given the condition of the nails I do not think he benefit from antifungal treatment for the onychomycosis  -Debrided onychomycotic nails 1 through 5 bilaterally.  Return 3 months [facility]

## 2024-08-06 DIAGNOSIS — E1165 Type 2 diabetes mellitus with hyperglycemia: Secondary | ICD-10-CM | POA: Diagnosis not present

## 2024-08-06 DIAGNOSIS — E119 Type 2 diabetes mellitus without complications: Secondary | ICD-10-CM | POA: Diagnosis not present

## 2024-08-09 DIAGNOSIS — R112 Nausea with vomiting, unspecified: Secondary | ICD-10-CM | POA: Diagnosis not present

## 2024-08-09 DIAGNOSIS — R197 Diarrhea, unspecified: Secondary | ICD-10-CM | POA: Diagnosis not present

## 2024-08-09 DIAGNOSIS — I440 Atrioventricular block, first degree: Secondary | ICD-10-CM | POA: Diagnosis not present

## 2024-08-09 DIAGNOSIS — Z79899 Other long term (current) drug therapy: Secondary | ICD-10-CM | POA: Diagnosis not present

## 2024-08-09 DIAGNOSIS — Z87891 Personal history of nicotine dependence: Secondary | ICD-10-CM | POA: Diagnosis not present

## 2024-08-09 DIAGNOSIS — Z7985 Long-term (current) use of injectable non-insulin antidiabetic drugs: Secondary | ICD-10-CM | POA: Diagnosis not present

## 2024-08-09 DIAGNOSIS — I452 Bifascicular block: Secondary | ICD-10-CM | POA: Diagnosis not present

## 2024-08-09 DIAGNOSIS — E1165 Type 2 diabetes mellitus with hyperglycemia: Secondary | ICD-10-CM | POA: Diagnosis not present

## 2024-08-09 DIAGNOSIS — R739 Hyperglycemia, unspecified: Secondary | ICD-10-CM | POA: Diagnosis not present

## 2024-08-09 DIAGNOSIS — Z794 Long term (current) use of insulin: Secondary | ICD-10-CM | POA: Diagnosis not present

## 2024-08-10 DIAGNOSIS — Z8551 Personal history of malignant neoplasm of bladder: Secondary | ICD-10-CM | POA: Diagnosis not present

## 2024-08-10 DIAGNOSIS — E1165 Type 2 diabetes mellitus with hyperglycemia: Secondary | ICD-10-CM | POA: Diagnosis not present

## 2024-08-10 DIAGNOSIS — I2510 Atherosclerotic heart disease of native coronary artery without angina pectoris: Secondary | ICD-10-CM | POA: Diagnosis not present

## 2024-08-10 DIAGNOSIS — Z7985 Long-term (current) use of injectable non-insulin antidiabetic drugs: Secondary | ICD-10-CM | POA: Diagnosis not present

## 2024-08-10 DIAGNOSIS — Z794 Long term (current) use of insulin: Secondary | ICD-10-CM | POA: Diagnosis not present

## 2024-08-10 DIAGNOSIS — Z556 Problems related to health literacy: Secondary | ICD-10-CM | POA: Diagnosis not present

## 2024-08-10 DIAGNOSIS — Z87891 Personal history of nicotine dependence: Secondary | ICD-10-CM | POA: Diagnosis not present

## 2024-08-10 DIAGNOSIS — E785 Hyperlipidemia, unspecified: Secondary | ICD-10-CM | POA: Diagnosis not present

## 2024-08-10 DIAGNOSIS — I10 Essential (primary) hypertension: Secondary | ICD-10-CM | POA: Diagnosis not present

## 2024-08-10 DIAGNOSIS — Z7982 Long term (current) use of aspirin: Secondary | ICD-10-CM | POA: Diagnosis not present

## 2024-08-17 ENCOUNTER — Inpatient Hospital Stay: Payer: Medicare Other | Attending: Hematology

## 2024-08-21 DIAGNOSIS — Z436 Encounter for attention to other artificial openings of urinary tract: Secondary | ICD-10-CM | POA: Diagnosis not present

## 2024-08-22 DIAGNOSIS — I2510 Atherosclerotic heart disease of native coronary artery without angina pectoris: Secondary | ICD-10-CM | POA: Diagnosis not present

## 2024-08-22 DIAGNOSIS — Z87891 Personal history of nicotine dependence: Secondary | ICD-10-CM | POA: Diagnosis not present

## 2024-08-22 DIAGNOSIS — E785 Hyperlipidemia, unspecified: Secondary | ICD-10-CM | POA: Diagnosis not present

## 2024-08-22 DIAGNOSIS — I10 Essential (primary) hypertension: Secondary | ICD-10-CM | POA: Diagnosis not present

## 2024-08-22 DIAGNOSIS — Z8551 Personal history of malignant neoplasm of bladder: Secondary | ICD-10-CM | POA: Diagnosis not present

## 2024-08-22 DIAGNOSIS — Z556 Problems related to health literacy: Secondary | ICD-10-CM | POA: Diagnosis not present

## 2024-08-22 DIAGNOSIS — E1165 Type 2 diabetes mellitus with hyperglycemia: Secondary | ICD-10-CM | POA: Diagnosis not present

## 2024-08-22 DIAGNOSIS — Z7985 Long-term (current) use of injectable non-insulin antidiabetic drugs: Secondary | ICD-10-CM | POA: Diagnosis not present

## 2024-08-22 DIAGNOSIS — Z794 Long term (current) use of insulin: Secondary | ICD-10-CM | POA: Diagnosis not present

## 2024-08-22 DIAGNOSIS — Z7982 Long term (current) use of aspirin: Secondary | ICD-10-CM | POA: Diagnosis not present

## 2024-09-05 DIAGNOSIS — Z87891 Personal history of nicotine dependence: Secondary | ICD-10-CM | POA: Diagnosis not present

## 2024-09-05 DIAGNOSIS — Z556 Problems related to health literacy: Secondary | ICD-10-CM | POA: Diagnosis not present

## 2024-09-05 DIAGNOSIS — E785 Hyperlipidemia, unspecified: Secondary | ICD-10-CM | POA: Diagnosis not present

## 2024-09-05 DIAGNOSIS — E119 Type 2 diabetes mellitus without complications: Secondary | ICD-10-CM | POA: Diagnosis not present

## 2024-09-05 DIAGNOSIS — Z7982 Long term (current) use of aspirin: Secondary | ICD-10-CM | POA: Diagnosis not present

## 2024-09-05 DIAGNOSIS — Z794 Long term (current) use of insulin: Secondary | ICD-10-CM | POA: Diagnosis not present

## 2024-09-05 DIAGNOSIS — I2510 Atherosclerotic heart disease of native coronary artery without angina pectoris: Secondary | ICD-10-CM | POA: Diagnosis not present

## 2024-09-05 DIAGNOSIS — Z7985 Long-term (current) use of injectable non-insulin antidiabetic drugs: Secondary | ICD-10-CM | POA: Diagnosis not present

## 2024-09-05 DIAGNOSIS — I10 Essential (primary) hypertension: Secondary | ICD-10-CM | POA: Diagnosis not present

## 2024-09-05 DIAGNOSIS — E1165 Type 2 diabetes mellitus with hyperglycemia: Secondary | ICD-10-CM | POA: Diagnosis not present

## 2024-09-05 DIAGNOSIS — Z8551 Personal history of malignant neoplasm of bladder: Secondary | ICD-10-CM | POA: Diagnosis not present

## 2024-09-19 DIAGNOSIS — Z436 Encounter for attention to other artificial openings of urinary tract: Secondary | ICD-10-CM | POA: Diagnosis not present

## 2024-09-21 ENCOUNTER — Ambulatory Visit (INDEPENDENT_AMBULATORY_CARE_PROVIDER_SITE_OTHER): Admitting: Audiology

## 2024-09-21 ENCOUNTER — Institutional Professional Consult (permissible substitution) (INDEPENDENT_AMBULATORY_CARE_PROVIDER_SITE_OTHER): Admitting: Physician Assistant

## 2024-09-26 DIAGNOSIS — R2681 Unsteadiness on feet: Secondary | ICD-10-CM | POA: Diagnosis not present

## 2024-09-26 DIAGNOSIS — E7800 Pure hypercholesterolemia, unspecified: Secondary | ICD-10-CM | POA: Diagnosis not present

## 2024-09-26 DIAGNOSIS — I2510 Atherosclerotic heart disease of native coronary artery without angina pectoris: Secondary | ICD-10-CM | POA: Diagnosis not present

## 2024-09-26 DIAGNOSIS — E11649 Type 2 diabetes mellitus with hypoglycemia without coma: Secondary | ICD-10-CM | POA: Diagnosis not present

## 2024-09-26 DIAGNOSIS — R809 Proteinuria, unspecified: Secondary | ICD-10-CM | POA: Diagnosis not present

## 2024-09-26 DIAGNOSIS — E1165 Type 2 diabetes mellitus with hyperglycemia: Secondary | ICD-10-CM | POA: Diagnosis not present

## 2024-09-26 DIAGNOSIS — I10 Essential (primary) hypertension: Secondary | ICD-10-CM | POA: Diagnosis not present

## 2024-10-12 ENCOUNTER — Other Ambulatory Visit: Payer: Self-pay

## 2024-10-12 ENCOUNTER — Inpatient Hospital Stay: Payer: Medicare Other

## 2024-10-16 DIAGNOSIS — Z436 Encounter for attention to other artificial openings of urinary tract: Secondary | ICD-10-CM | POA: Diagnosis not present

## 2024-10-19 ENCOUNTER — Inpatient Hospital Stay: Attending: Hematology

## 2024-10-19 DIAGNOSIS — C670 Malignant neoplasm of trigone of bladder: Secondary | ICD-10-CM

## 2024-10-19 DIAGNOSIS — Z8551 Personal history of malignant neoplasm of bladder: Secondary | ICD-10-CM | POA: Diagnosis present

## 2024-10-19 LAB — CMP (CANCER CENTER ONLY)
ALT: 22 U/L (ref 0–44)
AST: 22 U/L (ref 15–41)
Albumin: 3.9 g/dL (ref 3.5–5.0)
Alkaline Phosphatase: 64 U/L (ref 38–126)
Anion gap: 4 — ABNORMAL LOW (ref 5–15)
BUN: 15 mg/dL (ref 8–23)
CO2: 28 mmol/L (ref 22–32)
Calcium: 9.3 mg/dL (ref 8.9–10.3)
Chloride: 99 mmol/L (ref 98–111)
Creatinine: 1.03 mg/dL (ref 0.61–1.24)
GFR, Estimated: >60 mL/min (ref >60)
Glucose, Bld: 273 mg/dL — ABNORMAL HIGH (ref 70–99)
Potassium: 4.4 mmol/L (ref 3.5–5.1)
Sodium: 131 mmol/L — ABNORMAL LOW (ref 135–145)
Total Bilirubin: 1.1 mg/dL (ref 0.0–1.2)
Total Protein: 7.1 g/dL (ref 6.5–8.1)

## 2024-10-19 LAB — CBC WITH DIFFERENTIAL (CANCER CENTER ONLY)
Abs Immature Granulocytes: 0.01 K/uL (ref 0.00–0.07)
Basophils Absolute: 0 K/uL (ref 0.0–0.1)
Basophils Relative: 1 %
Eosinophils Absolute: 0.1 K/uL (ref 0.0–0.5)
Eosinophils Relative: 2 %
HCT: 36.6 % — ABNORMAL LOW (ref 39.0–52.0)
Hemoglobin: 13 g/dL (ref 13.0–17.0)
Immature Granulocytes: 0 %
Lymphocytes Relative: 25 %
Lymphs Abs: 0.9 K/uL (ref 0.7–4.0)
MCH: 31.9 pg (ref 26.0–34.0)
MCHC: 35.5 g/dL (ref 30.0–36.0)
MCV: 89.9 fL (ref 80.0–100.0)
Monocytes Absolute: 0.4 K/uL (ref 0.1–1.0)
Monocytes Relative: 10 %
Neutro Abs: 2.4 K/uL (ref 1.7–7.7)
Neutrophils Relative %: 62 %
Platelet Count: 171 K/uL (ref 150–400)
RBC: 4.07 MIL/uL — ABNORMAL LOW (ref 4.22–5.81)
RDW: 14.7 % (ref 11.5–15.5)
WBC Count: 3.8 K/uL — ABNORMAL LOW (ref 4.0–10.5)
nRBC: 0 % (ref 0.0–0.2)

## 2024-10-29 ENCOUNTER — Ambulatory Visit: Admitting: Podiatry

## 2024-10-30 ENCOUNTER — Ambulatory Visit: Admitting: Podiatry

## 2024-11-19 ENCOUNTER — Other Ambulatory Visit (INDEPENDENT_AMBULATORY_CARE_PROVIDER_SITE_OTHER): Payer: Self-pay | Admitting: Physician Assistant

## 2024-11-19 DIAGNOSIS — Z0110 Encounter for examination of ears and hearing without abnormal findings: Secondary | ICD-10-CM

## 2024-11-21 ENCOUNTER — Ambulatory Visit (INDEPENDENT_AMBULATORY_CARE_PROVIDER_SITE_OTHER): Admitting: Audiology

## 2024-11-21 ENCOUNTER — Encounter (INDEPENDENT_AMBULATORY_CARE_PROVIDER_SITE_OTHER): Payer: Self-pay | Admitting: Physician Assistant

## 2024-11-21 ENCOUNTER — Ambulatory Visit (INDEPENDENT_AMBULATORY_CARE_PROVIDER_SITE_OTHER): Admitting: Physician Assistant

## 2024-11-21 VITALS — BP 117/63 | HR 67 | Ht 69.5 in | Wt 174.0 lb

## 2024-11-21 DIAGNOSIS — H903 Sensorineural hearing loss, bilateral: Secondary | ICD-10-CM | POA: Diagnosis not present

## 2024-11-21 DIAGNOSIS — H6121 Impacted cerumen, right ear: Secondary | ICD-10-CM | POA: Diagnosis not present

## 2024-11-21 NOTE — Progress Notes (Addendum)
  [address], [address] [place], [place] [address] [phone]  Audiological Evaluation    Name: Gregory Cummings     DOB:   August 16, 1946      MRN:   [id number]                                                                                     Service Date: 11/21/2024     Accompanied by: significant other   Patient comes today after Reyes Cohen, PA-C sent a referral for a hearing evaluation due to concerns with hearing loss.   Symptoms Yes Details  Hearing loss  [x]  No hearing difficulty per patient  Tinnitus  []    Ear pain/ infections/pressure  []    Balance problems  []    Noise exposure history  []    Previous ear surgeries  []    Family history of hearing loss  []    Amplification  []    Other  []      Otoscopy: Right ear: Non-occluding cerumen, able to visualize some tympanic membrane landmarks. Left ear:  Clear external ear canal and notable landmarks visualized on the tympanic membrane.  Tympanometry: Right ear: Normal external ear canal volume with normal middle ear pressure and tympanic membrane compliance (Type A). Findings are suggestive of normal middle ear function. Left ear: Normal external ear canal volume with normal middle ear pressure and tympanic membrane compliance (Type A). Findings are suggestive of normal middle ear function.  Hearing Evaluation The hearing test results were completed under headphones and results are deemed to be of good reliability. 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 Left ear-  Normal hearing from [phone], then moderately severe  to severesensorineural hearing loss from 2000 Hz - 8000 Hz.  Speech Audiometry: Right ear- Speech Reception Threshold (SRT) was obtained at 20 dBHL. Left ear-Speech Reception Threshold (SRT) was obtained at 15 dBHL.   Word Recognition Score Tested using NU-6  (recorded) Right ear: 100% was obtained at a presentation level of 75 dBHL with contralateral masking which is deemed as  excellent. Left ear: 100% was obtained at a presentation level of 75 dBHL with contralateral masking which is deemed as  excellent.   Impression: There is not a significant difference in pure-tone thresholds between ears. There is not a significant difference in the word recognition score in between ears.    Recommendations: Follow up with ENT as scheduled for today. Return for a hearing evaluation if concerns with hearing changes arise or per MD recommendation. Use hearing protection when exposed to loud/damaging sounds.  Patient reports no perceived hearing difficulties and denies needing repetition from others. Counseling provided regarding hearing loss and amplification options. Patient is not ready for amplification at this time. Advised to return to clinic if awareness of hearing difficulties increases or communication challenges arise.   Avid Guillette MARIE LEROUX-MARTINEZ, AUD
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
## 2024-12-05 ENCOUNTER — Encounter: Payer: Self-pay | Admitting: Audiology

## 2024-12-07 ENCOUNTER — Inpatient Hospital Stay: Payer: Medicare Other | Attending: Hematology
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
## 2025-01-07 ENCOUNTER — Other Ambulatory Visit: Payer: Self-pay

## 2025-01-07 ENCOUNTER — Ambulatory Visit: Attending: Physician Assistant | Admitting: Rehabilitation

## 2025-01-07 ENCOUNTER — Encounter: Payer: Self-pay | Admitting: Rehabilitation

## 2025-01-07 DIAGNOSIS — R293 Abnormal posture: Secondary | ICD-10-CM | POA: Diagnosis present

## 2025-01-07 DIAGNOSIS — R42 Dizziness and giddiness: Secondary | ICD-10-CM | POA: Diagnosis present

## 2025-01-07 DIAGNOSIS — M6281 Muscle weakness (generalized): Secondary | ICD-10-CM | POA: Insufficient documentation

## 2025-01-07 DIAGNOSIS — R262 Difficulty in walking, not elsewhere classified: Secondary | ICD-10-CM | POA: Insufficient documentation

## 2025-01-11 ENCOUNTER — Inpatient Hospital Stay: Payer: Medicare Other

## 2025-01-16 ENCOUNTER — Ambulatory Visit: Admitting: Rehabilitation

## 2025-01-16 DIAGNOSIS — M6281 Muscle weakness (generalized): Secondary | ICD-10-CM

## 2025-01-16 DIAGNOSIS — R42 Dizziness and giddiness: Secondary | ICD-10-CM | POA: Diagnosis not present

## 2025-01-16 DIAGNOSIS — R262 Difficulty in walking, not elsewhere classified: Secondary | ICD-10-CM

## 2025-01-16 DIAGNOSIS — R293 Abnormal posture: Secondary | ICD-10-CM
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
## 2025-01-23 ENCOUNTER — Ambulatory Visit: Admitting: Rehabilitation

## 2025-01-30 ENCOUNTER — Encounter: Payer: Self-pay | Admitting: Rehabilitation

## 2025-01-30 ENCOUNTER — Ambulatory Visit: Admitting: Rehabilitation

## 2025-01-30 DIAGNOSIS — R262 Difficulty in walking, not elsewhere classified: Secondary | ICD-10-CM

## 2025-01-30 DIAGNOSIS — R293 Abnormal posture: Secondary | ICD-10-CM

## 2025-01-30 DIAGNOSIS — R42 Dizziness and giddiness: Secondary | ICD-10-CM

## 2025-01-30 DIAGNOSIS — M6281 Muscle weakness (generalized): Secondary | ICD-10-CM

## 2025-02-06 ENCOUNTER — Ambulatory Visit: Admitting: Rehabilitation

## 2025-02-15 ENCOUNTER — Inpatient Hospital Stay: Attending: Hematology

## 2025-02-15 ENCOUNTER — Inpatient Hospital Stay: Admitting: Nurse Practitioner
